# Patient Record
Sex: Female | Born: 1995 | Race: Black or African American | Hispanic: No | Marital: Single | Smoking: Former smoker
Health system: Southern US, Community
[De-identification: ages and names within clinical notes are randomized; demographics above are authoritative.]

## PROBLEM LIST (undated history)

## (undated) ENCOUNTER — Inpatient Hospital Stay (HOSPITAL_COMMUNITY): Payer: Self-pay

## (undated) DIAGNOSIS — R509 Fever, unspecified: Secondary | ICD-10-CM

## (undated) DIAGNOSIS — N179 Acute kidney failure, unspecified: Secondary | ICD-10-CM

## (undated) DIAGNOSIS — O8612 Endometritis following delivery: Secondary | ICD-10-CM

## (undated) DIAGNOSIS — I7100 Dissection of unspecified site of aorta: Secondary | ICD-10-CM

## (undated) DIAGNOSIS — R000 Tachycardia, unspecified: Secondary | ICD-10-CM

## (undated) DIAGNOSIS — A419 Sepsis, unspecified organism: Secondary | ICD-10-CM

## (undated) DIAGNOSIS — Z3A19 19 weeks gestation of pregnancy: Secondary | ICD-10-CM

## (undated) DIAGNOSIS — E872 Acidosis, unspecified: Secondary | ICD-10-CM

## (undated) DIAGNOSIS — E876 Hypokalemia: Secondary | ICD-10-CM

## (undated) DIAGNOSIS — O365930 Maternal care for other known or suspected poor fetal growth, third trimester, not applicable or unspecified: Secondary | ICD-10-CM

## (undated) DIAGNOSIS — D649 Anemia, unspecified: Secondary | ICD-10-CM

## (undated) DIAGNOSIS — O26849 Uterine size-date discrepancy, unspecified trimester: Secondary | ICD-10-CM

## (undated) DIAGNOSIS — A749 Chlamydial infection, unspecified: Secondary | ICD-10-CM

## (undated) DIAGNOSIS — Z3689 Encounter for other specified antenatal screening: Secondary | ICD-10-CM

## (undated) DIAGNOSIS — J9601 Acute respiratory failure with hypoxia: Secondary | ICD-10-CM

## (undated) DIAGNOSIS — D72829 Elevated white blood cell count, unspecified: Secondary | ICD-10-CM

## (undated) DIAGNOSIS — R7881 Bacteremia: Secondary | ICD-10-CM

## (undated) DIAGNOSIS — R6521 Severe sepsis with septic shock: Secondary | ICD-10-CM

## (undated) DIAGNOSIS — O903 Peripartum cardiomyopathy: Secondary | ICD-10-CM

## (undated) DIAGNOSIS — F32A Depression, unspecified: Secondary | ICD-10-CM

## (undated) DIAGNOSIS — N12 Tubulo-interstitial nephritis, not specified as acute or chronic: Secondary | ICD-10-CM

## (undated) DIAGNOSIS — O365990 Maternal care for other known or suspected poor fetal growth, unspecified trimester, not applicable or unspecified: Secondary | ICD-10-CM

## (undated) DIAGNOSIS — M549 Dorsalgia, unspecified: Secondary | ICD-10-CM

## (undated) DIAGNOSIS — B9620 Unspecified Escherichia coli [E. coli] as the cause of diseases classified elsewhere: Secondary | ICD-10-CM

## (undated) DIAGNOSIS — N10 Acute tubulo-interstitial nephritis: Secondary | ICD-10-CM

## (undated) DIAGNOSIS — O09299 Supervision of pregnancy with other poor reproductive or obstetric history, unspecified trimester: Secondary | ICD-10-CM

## (undated) HISTORY — DX: Uterine size-date discrepancy, unspecified trimester: O26.849

## (undated) HISTORY — DX: Peripartum cardiomyopathy: O90.3

## (undated) HISTORY — DX: Tubulo-interstitial nephritis, not specified as acute or chronic: N12

## (undated) HISTORY — DX: Acute kidney failure, unspecified: N17.9

## (undated) HISTORY — DX: Anemia, unspecified: D64.9

## (undated) HISTORY — DX: Acidosis: E87.2

## (undated) HISTORY — DX: Dissection of unspecified site of aorta: I71.00

## (undated) HISTORY — DX: Unspecified Escherichia coli (E. coli) as the cause of diseases classified elsewhere: B96.20

## (undated) HISTORY — DX: Hypomagnesemia: E83.42

## (undated) HISTORY — DX: 19 weeks gestation of pregnancy: Z3A.19

## (undated) HISTORY — DX: Sepsis, unspecified organism: R65.21

## (undated) HISTORY — DX: Acute pyelonephritis: N10

## (undated) HISTORY — DX: Encounter for other specified antenatal screening: Z36.89

## (undated) HISTORY — DX: Tachycardia, unspecified: R00.0

## (undated) HISTORY — DX: Acidosis, unspecified: E87.20

## (undated) HISTORY — DX: Chlamydial infection, unspecified: A74.9

## (undated) HISTORY — DX: Fever, unspecified: R50.9

## (undated) HISTORY — PX: DILATION AND CURETTAGE OF UTERUS: SHX78

## (undated) HISTORY — DX: Sepsis, unspecified organism: A41.9

## (undated) HISTORY — DX: Hypokalemia: E87.6

## (undated) HISTORY — DX: Bacteremia: R78.81

## (undated) HISTORY — DX: Maternal care for other known or suspected poor fetal growth, third trimester, not applicable or unspecified: O36.5930

## (undated) HISTORY — DX: Acute respiratory failure with hypoxia: J96.01

## (undated) HISTORY — DX: Endometritis following delivery: O86.12

## (undated) HISTORY — DX: Elevated white blood cell count, unspecified: D72.829

## (undated) HISTORY — DX: Maternal care for other known or suspected poor fetal growth, unspecified trimester, not applicable or unspecified: O36.5990

## (undated) HISTORY — DX: Dorsalgia, unspecified: M54.9

## (undated) HISTORY — DX: Supervision of pregnancy with other poor reproductive or obstetric history, unspecified trimester: O09.299

---
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
## 2015-01-01 ENCOUNTER — Encounter: Payer: Medicaid Other | Admitting: Physician Assistant

## 2015-01-01 ENCOUNTER — Encounter: Payer: Self-pay | Admitting: Physician Assistant

## 2015-01-08 ENCOUNTER — Ambulatory Visit (INDEPENDENT_AMBULATORY_CARE_PROVIDER_SITE_OTHER): Payer: Medicaid Other | Admitting: Advanced Practice Midwife

## 2015-01-08 VITALS — BP 119/68 | HR 86 | Temp 98.2°F | Wt 127.6 lb

## 2015-01-08 DIAGNOSIS — O219 Vomiting of pregnancy, unspecified: Secondary | ICD-10-CM | POA: Diagnosis not present

## 2015-01-08 DIAGNOSIS — Z3402 Encounter for supervision of normal first pregnancy, second trimester: Secondary | ICD-10-CM

## 2015-01-08 DIAGNOSIS — Z23 Encounter for immunization: Secondary | ICD-10-CM

## 2015-01-08 MED ORDER — ONDANSETRON 4 MG PO TBDP: 4.0000 mg | ORAL_TABLET | Freq: Three times a day (TID) | ORAL | Status: DC | PRN

## 2015-01-08 MED ORDER — TETANUS-DIPHTH-ACELL PERTUSSIS 5-2.5-18.5 LF-MCG/0.5 IM SUSP
0.5000 mL | Freq: Once | INTRAMUSCULAR | Status: AC
Start: 2015-01-08 — End: 2015-01-08
  Administered 2015-01-08: 0.5 mL via INTRAMUSCULAR

## 2015-01-08 NOTE — Progress Notes (Signed)
Vomited Glucola. Will reschedule and Rx antiemetic.

## 2015-01-08 NOTE — Progress Notes (Signed)
Attempted 1hr-- patient vomited after drinking. To come back for one hour lab appointment. Needs refill of zofran/phenergan beforehand.  Tdap today.  Has not yet left urine.

## 2015-01-08 NOTE — Addendum Note (Signed)
Addended by: Kathee Delton on: 01/08/2015 12:52 PM   Modules accepted: Orders

## 2015-01-08 NOTE — Patient Instructions (Signed)
Preterm Labor Information Preterm labor is when labor starts at less than 37 weeks of pregnancy. The normal length of a pregnancy is 39 to 41 weeks. CAUSES Often, there is no identifiable underlying cause as to why a woman goes into preterm labor. One of the most common known causes of preterm labor is infection. Infections of the uterus, cervix, vagina, amniotic sac, bladder, kidney, or even the lungs (pneumonia) can cause labor to start. Other suspected causes of preterm labor include:   Urogenital infections, such as yeast infections and bacterial vaginosis.   Uterine abnormalities (uterine shape, uterine septum, fibroids, or bleeding from the placenta).   A cervix that has been operated on (it may fail to stay closed).   Malformations in the fetus.   Multiple gestations (twins, triplets, and so on).   Breakage of the amniotic sac.  RISK FACTORS  Having a previous history of preterm labor.   Having premature rupture of membranes (PROM).   Having a placenta that covers the opening of the cervix (placenta previa).   Having a placenta that separates from the uterus (placental abruption).   Having a cervix that is too weak to hold the fetus in the uterus (incompetent cervix).   Having too much fluid in the amniotic sac (polyhydramnios).   Taking illegal drugs or smoking while pregnant.   Not gaining enough weight while pregnant.   Being younger than 62 and older than 19 years old.   Having a low socioeconomic status.   Being African American. SYMPTOMS Signs and symptoms of preterm labor include:   Menstrual-like cramps, abdominal pain, or back pain.  Uterine contractions that are regular, as frequent as six in an hour, regardless of their intensity (may be mild or painful).  Contractions that start on the top of the uterus and spread down to the lower abdomen and back.   A sense of increased pelvic pressure.   A watery or bloody mucus discharge that  comes from the vagina.  TREATMENT Depending on the length of the pregnancy and other circumstances, your health care provider may suggest bed rest. If necessary, there are medicines that can be given to stop contractions and to mature the fetal lungs. If labor happens before 34 weeks of pregnancy, a prolonged hospital stay may be recommended. Treatment depends on the condition of both you and the fetus.  WHAT SHOULD YOU DO IF YOU THINK YOU ARE IN PRETERM LABOR? Call your health care provider right away. You will need to go to the hospital to get checked immediately. HOW CAN YOU PREVENT PRETERM LABOR IN FUTURE PREGNANCIES? You should:   Stop smoking if you smoke.  Maintain healthy weight gain and avoid chemicals and drugs that are not necessary.  Be watchful for any type of infection.  Inform your health care provider if you have a known history of preterm labor. Document Released: 10/23/2003 Document Revised: 04/04/2013 Document Reviewed: 09/04/2012 [facility] Patient Information 2015 [place], [place]. This information is not intended to replace advice given to you by your health care provider. Make sure you discuss any questions you have with your health care provider.  Tdap Vaccine (Tetanus, Diphtheria, Pertussis): What You Need to Know 1. Why get vaccinated? Tetanus, diphtheria and pertussis can be very serious diseases, even for adolescents and adults. Tdap vaccine can protect [place] from these diseases. TETANUS (Lockjaw) causes painful muscle tightening and stiffness, usually all over the body.  It can lead to tightening of muscles in the head and neck so you  can't open your mouth, swallow, or sometimes even breathe. Tetanus kills about 1 out of 5 people who are infected. DIPHTHERIA can cause a thick coating to form in the back of the throat.  It can lead to breathing problems, paralysis, heart failure, and death. PERTUSSIS (Whooping Cough) causes severe coughing spells, which can cause  difficulty breathing, vomiting and disturbed sleep.  It can also lead to weight loss, incontinence, and rib fractures. Up to 2 in 100 adolescents and 5 in 100 adults with pertussis are hospitalized or have complications, which could include pneumonia or death. These diseases are caused by bacteria. Diphtheria and pertussis are spread from person to person through coughing or sneezing. Tetanus enters the body through cuts, scratches, or wounds. Before vaccines, the [place] saw as many as 200,000 cases a year of diphtheria and pertussis, and hundreds of cases of tetanus. Since vaccination began, tetanus and diphtheria have dropped by about 99% and pertussis by about 80%. 2. Tdap vaccine Tdap vaccine can protect adolescents and adults from tetanus, diphtheria, and pertussis. One dose of Tdap is routinely given at age 19 or 7112. People who did not get Tdap at that age should get it as soon as possible. Tdap is especially important for health care professionals and anyone having close contact with a baby younger than 12 months. Pregnant women should get a dose of Tdap during every pregnancy, to protect the newborn from pertussis. Infants are most at risk for severe, life-threatening complications from pertussis. A similar vaccine, called Td, protects from tetanus and diphtheria, but not pertussis. A Td booster should be given every 10 years. Tdap may be given as one of these boosters if you have not already gotten a dose. Tdap may also be given after a severe cut or burn to prevent tetanus infection. Your doctor can give you more information. Tdap may safely be given at the same time as other vaccines. 3. Some people should not get this vaccine  If you ever had a life-threatening allergic reaction after a dose of any tetanus, diphtheria, or pertussis containing vaccine, OR if you have a severe allergy to any part of this vaccine, you should not get Tdap. Tell your doctor if you have any severe  allergies.  If you had a coma, or long or multiple seizures within 7 days after a childhood dose of DTP or DTaP, you should not get Tdap, unless a cause other than the vaccine was found. You can still get Td.  Talk to your doctor if you:  have epilepsy or another nervous system problem,  had severe pain or swelling after any vaccine containing diphtheria, tetanus or pertussis,  ever had Guillain-Barr Syndrome (GBS),  aren't feeling well on the day the shot is scheduled. 4. Risks of a vaccine reaction With any medicine, including vaccines, there is a chance of side effects. These are usually mild and go away on their own, but serious reactions are also possible. Brief fainting spells can follow a vaccination, leading to injuries from falling. Sitting or lying down for about 15 minutes can help prevent these. Tell your doctor if you feel dizzy or light-headed, or have vision changes or ringing in the ears. Mild problems following Tdap (Did not interfere with activities)  Pain where the shot was given (about 3 in 4 adolescents or 2 in 3 adults)  Redness or swelling where the shot was given (about 1 person in 5)  Mild fever of at least 100.110F (up to about 1  in 25 adolescents or 1 in 100 adults)  Headache (about 3 or 4 people in 10)  Tiredness (about 1 person in 3 or 4)  Nausea, vomiting, diarrhea, stomach ache (up to 1 in 4 adolescents or 1 in 10 adults)  Chills, body aches, sore joints, rash, swollen glands (uncommon) Moderate problems following Tdap (Interfered with activities, but did not require medical attention)  Pain where the shot was given (about 1 in 5 adolescents or 1 in 100 adults)  Redness or swelling where the shot was given (up to about 1 in 16 adolescents or 1 in 25 adults)  Fever over 102F (about 1 in 100 adolescents or 1 in 250 adults)  Headache (about 3 in 20 adolescents or 1 in 10 adults)  Nausea, vomiting, diarrhea, stomach ache (up to 1 or 3 people in  100)  Swelling of the entire arm where the shot was given (up to about 3 in 100). Severe problems following Tdap (Unable to perform usual activities; required medical attention)  Swelling, severe pain, bleeding and redness in the arm where the shot was given (rare). A severe allergic reaction could occur after any vaccine (estimated less than 1 in a million doses). 5. What if there is a serious reaction? What should I look for?  Look for anything that concerns you, such as signs of a severe allergic reaction, very high fever, or behavior changes. Signs of a severe allergic reaction can include hives, swelling of the face and throat, difficulty breathing, a fast heartbeat, dizziness, and weakness. These would start a few minutes to a few hours after the vaccination. What should I do?  If you think it is a severe allergic reaction or other emergency that can't wait, call 9-1-1 or get the person to the nearest hospital. Otherwise, call your doctor.  Afterward, the reaction should be reported to the "Vaccine Adverse Event Reporting System" (VAERS). Your doctor might file this report, or you can do it yourself through the VAERS web site at www.vaers.hhs.gov, or by calling 1-800-822-7967. VAERS is only for reporting reactions. They do not give medical advice.  6. The National Vaccine Injury Compensation Program The National Vaccine Injury Compensation Program (VICP) is a federal program that was created to compensate people who may have been injured by certain vaccines. Persons who believe they may have been injured by a vaccine can learn about the program and about filing a claim by calling 1-800-338-2382 or visiting the VICP website at www.hrsa.gov/vaccinecompensation. 7. How can I learn more?  Ask your doctor.  Call your local or state health department.  Contact the Centers for Disease Control and Prevention (CDC):  Call 1-800-232-4636 or visit CDC's website at www.cdc.gov/vaccines. CDC  Tdap Vaccine VIS (12/23/11) Document Released: 02/01/2012 Document Revised: 12/17/2013 Document Reviewed: 11/14/2013 [employer] Patient Information 2015 [employer]. This information is not intended to replace advice given to you by your health care provider. Make sure you discuss any questions you have with your health care provider.  

## 2015-01-22 ENCOUNTER — Encounter: Payer: Medicaid Other | Admitting: Family

## 2015-01-24 ENCOUNTER — Encounter (HOSPITAL_COMMUNITY): Payer: Self-pay | Admitting: Emergency Medicine

## 2015-01-24 ENCOUNTER — Emergency Department (HOSPITAL_COMMUNITY)
Admission: EM | Admit: 2015-01-24 | Discharge: 2015-01-25 | Disposition: A | Discharge status: Home / Self Care | Payer: Medicaid Other | Attending: Emergency Medicine | Admitting: Emergency Medicine

## 2015-01-24 DIAGNOSIS — O9989 Other specified diseases and conditions complicating pregnancy, childbirth and the puerperium: Secondary | ICD-10-CM | POA: Insufficient documentation

## 2015-01-24 DIAGNOSIS — O26899 Other specified pregnancy related conditions, unspecified trimester: Secondary | ICD-10-CM

## 2015-01-24 DIAGNOSIS — R109 Unspecified abdominal pain: Secondary | ICD-10-CM | POA: Insufficient documentation

## 2015-01-24 DIAGNOSIS — Z87891 Personal history of nicotine dependence: Secondary | ICD-10-CM | POA: Insufficient documentation

## 2015-01-24 DIAGNOSIS — Z3A28 28 weeks gestation of pregnancy: Secondary | ICD-10-CM | POA: Insufficient documentation

## 2015-01-24 LAB — COMPREHENSIVE METABOLIC PANEL
ALT: 6 U/L — AB (ref 14–54)
ANION GAP: 8 (ref 5–15)
AST: 12 U/L — ABNORMAL LOW (ref 15–41)
Albumin: 2.7 g/dL — ABNORMAL LOW (ref 3.5–5.0)
Alkaline Phosphatase: 80 U/L (ref 38–126)
BUN: 8 mg/dL (ref 6–20)
CALCIUM: 8.5 mg/dL — AB (ref 8.9–10.3)
CHLORIDE: 104 mmol/L (ref 101–111)
CO2: 24 mmol/L (ref 22–32)
Creatinine, Ser: 0.51 mg/dL (ref 0.44–1.00)
GFR calc non Af Amer: >60 mL/min (ref >60)
GLUCOSE: 91 mg/dL (ref 65–99)
POTASSIUM: 3.1 mmol/L — AB (ref 3.5–5.1)
SODIUM: 136 mmol/L (ref 135–145)
TOTAL PROTEIN: 6.2 g/dL — AB (ref 6.5–8.1)
Total Bilirubin: 0.2 mg/dL — ABNORMAL LOW (ref 0.3–1.2)

## 2015-01-24 LAB — URINALYSIS, ROUTINE W REFLEX MICROSCOPIC
Bilirubin Urine: NEGATIVE
GLUCOSE, UA: NEGATIVE mg/dL
HGB URINE DIPSTICK: NEGATIVE
Ketones, ur: NEGATIVE mg/dL
Nitrite: NEGATIVE
Protein, ur: NEGATIVE mg/dL
SPECIFIC GRAVITY, URINE: 1.022 (ref 1.005–1.030)
Urobilinogen, UA: 1 mg/dL (ref 0.0–1.0)
pH: 6 (ref 5.0–8.0)

## 2015-01-24 LAB — CBC WITH DIFFERENTIAL/PLATELET
Basophils Absolute: 0 10*3/uL (ref 0.0–0.1)
Basophils Relative: 0 % (ref 0–1)
EOS ABS: 0.4 10*3/uL (ref 0.0–0.7)
EOS PCT: 6 % — AB (ref 0–5)
HEMATOCRIT: 25.2 % — AB (ref 36.0–46.0)
Hemoglobin: 8.3 g/dL — ABNORMAL LOW (ref 12.0–15.0)
Lymphocytes Relative: 26 % (ref 12–46)
Lymphs Abs: 2 10*3/uL (ref 0.7–4.0)
MCH: 28.2 pg (ref 26.0–34.0)
MCHC: 32.9 g/dL (ref 30.0–36.0)
MCV: 85.7 fL (ref 78.0–100.0)
MONOS PCT: 8 % (ref 3–12)
Monocytes Absolute: 0.6 10*3/uL (ref 0.1–1.0)
NEUTROS ABS: 4.6 10*3/uL (ref 1.7–7.7)
Neutrophils Relative %: 60 % (ref 43–77)
PLATELETS: 272 10*3/uL (ref 150–400)
RBC: 2.94 MIL/uL — ABNORMAL LOW (ref 3.87–5.11)
RDW: 12.9 % (ref 11.5–15.5)
WBC: 7.6 10*3/uL (ref 4.0–10.5)

## 2015-01-24 LAB — URINE MICROSCOPIC-ADD ON

## 2015-01-24 LAB — HCG, QUANTITATIVE, PREGNANCY: hCG, Beta Chain, Quant, S: 20093 m[IU]/mL — ABNORMAL HIGH (ref <5)

## 2015-01-24 MED ORDER — CEPHALEXIN 500 MG PO CAPS: 500.0000 mg | ORAL_CAPSULE | Freq: Three times a day (TID) | ORAL | Status: DC

## 2015-01-24 NOTE — ED Provider Notes (Signed)
CSN: [id number]     Arrival date & time 01/24/15  2122 History   First MD Initiated Contact with Patient 01/24/15 2155     Chief Complaint  Patient presents with  . Abdominal Pain     (Consider location/radiation/quality/duration/timing/severity/associated sxs/prior Treatment) The history is provided by the patient and medical records.   This is an 19 year old G1 P0 approximately 7 months gestation, presenting to the ED for right-sided abdominal pain. She states this began yesterday and has been occurring intermittently since this time. She states pain is described as a "pulling and stabbing" sensation along the right side of her abdomen. States pain better when lying on her side. She denies any nausea, vomiting, or diarrhea. She denies any urinary symptoms. No vaginal bleeding or loss of fluid.  Patient is seen at [facility] hospital.  States no complications thus far during pregnancy.  She is still taking her pre-natal vitamins.  Patient states baby has continued moving regularly throughout the day.  History reviewed. No pertinent past medical history. History reviewed. No pertinent past surgical history. Family History  Problem Relation Age of Onset  . Cancer Mother    History  Substance Use Topics  . Smoking status: Former Smoker -- 0.00 packs/day    Types: Cigars  . Smokeless tobacco: Not on file  . Alcohol Use: No     Comment: occassionally   OB History    Gravida Para Term Preterm AB TAB SAB Ectopic Multiple Living   1              Review of Systems  Gastrointestinal: Positive for abdominal pain.  All other systems reviewed and are negative.     Allergies  Review of patient's allergies indicates no known allergies.  Home Medications   Prior to Admission medications   Medication Sig Start Date End Date Taking? Authorizing Provider  ondansetron (ZOFRAN ODT) 4 MG disintegrating tablet Take 1 tablet (4 mg total) by mouth every 8 (eight) hours as needed for nausea or  vomiting. 01/08/15  Yes Dorathy Kinsman, CNM  Prenatal Vit-Fe Fumarate-FA (PRENATAL COMPLETE) 14-0.4 MG TABS Take as directed Patient taking differently: Take 1 tablet by mouth daily.  09/02/14  Yes Gerhard Munch, MD  promethazine (PHENERGAN) 25 MG tablet Take 0.5-1 tablets (12.5-25 mg total) by mouth every 6 (six) hours as needed. Patient not taking: Reported on 12/04/2014 10/10/14   Armando Reichert, CNM   BP 115/59 mmHg  Pulse 80  Temp(Src) 98.3 F (36.8 C) (Oral)  Resp 20  Ht  (1.6 m)  Wt 132 lb (59.875 kg)  BMI 23.39 kg/m2  SpO2 100%  LMP 07/05/2014   Physical Exam  Constitutional: She is oriented to person, place, and time. She appears well-developed and well-nourished.  HENT:  Head: Normocephalic and atraumatic.  Mouth/Throat: Oropharynx is clear and moist.  Eyes: Conjunctivae and EOM are normal. Pupils are equal, round, and reactive to light.  Neck: Normal range of motion.  Cardiovascular: Normal rate, regular rhythm and normal heart sounds.   Pulmonary/Chest: Effort normal. No respiratory distress. She has no wheezes.  Abdominal: Soft. Bowel sounds are normal. There is tenderness. There is no CVA tenderness and no tenderness at McBurney's point.    Gravid abdomen; tenderness along right side of abdomen  Musculoskeletal: Normal range of motion.  Neurological: She is alert and oriented to person, place, and time.  Skin: Skin is warm and dry.  Psychiatric: She has a normal mood and affect.  Nursing note and vitals  reviewed.   ED Course  Procedures (including critical care time) Labs Review Labs Reviewed  CBC WITH DIFFERENTIAL/PLATELET - Abnormal; Notable for the following:    RBC 2.94 (*)    Hemoglobin 8.3 (*)    HCT 25.2 (*)    Eosinophils Relative 6 (*)    All other components within normal limits  COMPREHENSIVE METABOLIC PANEL - Abnormal; Notable for the following:    Potassium 3.1 (*)    Calcium 8.5 (*)    Total Protein 6.2 (*)    Albumin 2.7 (*)    AST  12 (*)    ALT 6 (*)    Total Bilirubin 0.2 (*)    All other components within normal limits  HCG, QUANTITATIVE, PREGNANCY - Abnormal; Notable for the following:    hCG, Beta Chain, Sharene Butters, S 20093 (*)    All other components within normal limits  URINALYSIS, ROUTINE W REFLEX MICROSCOPIC (NOT AT [facility]) - Abnormal; Notable for the following:    APPearance CLOUDY (*)    Leukocytes, UA MODERATE (*)    All other components within normal limits  URINE MICROSCOPIC-ADD ON - Abnormal; Notable for the following:    Squamous Epithelial / LPF MANY (*)    Bacteria, UA FEW (*)    All other components within normal limits  URINE CULTURE    Imaging Review No results found.   EKG Interpretation None      MDM   Final diagnoses:  Abdominal pain in pregnancy   19 y.o. G1P0 approx 7 months gestation here with right sided abdominal pain which began yesterday.  States localized to right side of abdomen.  No associated symptoms. Pain improves with lying on her side. Patient has had an uncoiled K pregnancy thus far.  States baby has continued moving regularly today.  Rapid OB paged on arrival and patient was monitored for approx 1.5 hours-- FHR stable in 140's, no contraction activity noted.  Lab work here is overall reassuring.  U/a does appear mildly infected with moderate leuks, bacteria, and 11-20 WBC's.  No hematuria to suggest acute stone.  Will write for keflex pending urine culture.  Given that patient remains stable without vaginal bleeding, loss or fluid, N/V, or any signs of distress do not feel she emergently needs further work-up and is stable for discharge.  Patient has previously scheduled follow-up at [facility] on 6/20-- IVP patient to keep this appointment.  Will follow-up sooner for any new or worsening symptoms.  Discussed plan with patient, he/she acknowledged understanding and agreed with plan of care.  Return precautions given for new or worsening symptoms.  Case discussed with  attending physician, Dr. Juleen China, who evaluated patient and agrees with assessment and plan of care.  Garlon Hatchet, PA-C 01/25/15 9842  Raeford Razor, MD 01/29/15 [phone]

## 2015-01-24 NOTE — Discharge Instructions (Signed)
May take tylenol as needed for abdominal pain. Follow-up at [facility] hospital as scheduled or sooner for any new/worsening symptoms-- recurrent pain, vaginal bleeding, loss of fluid, etc.

## 2015-01-24 NOTE — Progress Notes (Signed)
Per chart review, patient has Medicaid [place] Access insurance with [facility] department listed as pcp.  Patient also has an upcoming appointment at [facility] out patient clinic on 06/20 at 1030am.

## 2015-01-24 NOTE — ED Notes (Signed)
Pt c/o R sided abd pain, describes as pulling and stabbing, pt is 7 mo preg, G1P0. Denies vag bleeding

## 2015-01-24 NOTE — ED Notes (Signed)
Pt placed on external fetal monitors. FHR reassuring and reactive. No UC's. Pt c/o right sided pain. Call to Dr Emelda Fear. OB cleared. Pt has next OB appointment 6/20 at [facility]. EDP aware of OB treatment

## 2015-01-26 LAB — URINE CULTURE: Colony Count: 30000

## 2015-02-03 ENCOUNTER — Ambulatory Visit (INDEPENDENT_AMBULATORY_CARE_PROVIDER_SITE_OTHER): Payer: Medicaid Other | Admitting: Obstetrics and Gynecology

## 2015-02-03 VITALS — BP 111/61 | HR 83 | Temp 98.1°F | Wt 128.8 lb

## 2015-02-03 DIAGNOSIS — O26849 Uterine size-date discrepancy, unspecified trimester: Secondary | ICD-10-CM

## 2015-02-03 DIAGNOSIS — Z3493 Encounter for supervision of normal pregnancy, unspecified, third trimester: Secondary | ICD-10-CM

## 2015-02-03 DIAGNOSIS — O3660X0 Maternal care for excessive fetal growth, unspecified trimester, not applicable or unspecified: Secondary | ICD-10-CM

## 2015-02-03 DIAGNOSIS — Z3402 Encounter for supervision of normal first pregnancy, second trimester: Secondary | ICD-10-CM

## 2015-02-03 LAB — POCT URINALYSIS DIP (DEVICE)
Bilirubin Urine: NEGATIVE
Glucose, UA: NEGATIVE mg/dL
Hgb urine dipstick: NEGATIVE
Ketones, ur: NEGATIVE mg/dL
Nitrite: NEGATIVE
Protein, ur: NEGATIVE mg/dL
Specific Gravity, Urine: 1.02 (ref 1.005–1.030)
Urobilinogen, UA: 2 mg/dL — ABNORMAL HIGH (ref 0.0–1.0)
pH: 7 (ref 5.0–8.0)

## 2015-02-03 LAB — OB RESULTS CONSOLE HIV ANTIBODY (ROUTINE TESTING): HIV: NONREACTIVE

## 2015-02-03 NOTE — Progress Notes (Signed)
Breastfeeding tip of the week reviewed 1 hour gtt with 28 wk labs 28 wk educational material given Pt recently had visit to [facility]U of right lower abdominal pain Pt reports being dizzy

## 2015-02-03 NOTE — Progress Notes (Signed)
Subjective:  Toccarra Kestel is a 19 y.o. G1P0 at [redacted]w[redacted]d being seen today for ongoing prenatal care.  Patient reports Dizziness, RLQ pain -- worse with laying on side..  Contractions: Not present.  Vag. Bleeding: None. Movement: Present. Denies leaking of fluid.   Recently went to [facility]U for RLQ pain, dx'd with UTI,  But UCx normal. Took all of keflex and pain improved somewhat  The following portions of the patient's history were reviewed and updated as appropriate: allergies, current medications, past family history, past medical history, past social history, past surgical history and problem list.   Objective:   Filed Vitals:   02/03/15 1054  BP: 111/61  Pulse: 83  Temp: 98.1 F (36.7 C)  Weight: 128 lb 12.8 oz (58.423 kg)    Fetal Status: Fetal Heart Rate (bpm): 149 Fundal Height: 26 cm Movement: Present     General:  Alert, oriented and cooperative. Patient is in no acute distress.  Skin: Skin is warm and dry. No rash noted.   Cardiovascular: Normal heart rate noted  Respiratory: Effort and breath sounds normal, no problems with respiration noted  Abdomen: Soft, gravid, appropriate for gestational age. Pain/Pressure: Absent     Vaginal: Vag. Bleeding: None.       Cervix: Not evaluated       Extremities: Normal range of motion.  Edema: Trace  Mental Status: Normal mood and affect. Normal behavior. Normal judgment and thought content.   Urinalysis: Urine Protein: Negative Urine Glucose: Negative  Assessment and Plan:  Pregnancy: G1P0 at [redacted]w[redacted]d  1. Supervision of normal first pregnancy in second trimester No concerns. Discussed round ligament pain.  2. Prenatal care in third trimester Routine PNC - Glucose Tolerance, 1 HR (50g) w/o Fasting - CBC - RPR - HIV antibody (with reflex)  #) Size < dates - measuring 25cm at 30 w - growth [place] ordered  Preterm labor symptoms and general obstetric precautions including but not limited to vaginal bleeding, contractions, leaking of  fluid and fetal movement were reviewed in detail with the patient.  Please refer to After Visit Summary for other counseling recommendations.   No Follow-up on file.   Ethelda Chick, MD

## 2015-02-04 LAB — CBC
HEMATOCRIT: 28.5 % — AB (ref 36.0–46.0)
HEMOGLOBIN: 9.4 g/dL — AB (ref 12.0–15.0)
MCH: 28.3 pg (ref 26.0–34.0)
MCHC: 33 g/dL (ref 30.0–36.0)
MCV: 85.8 fL (ref 78.0–100.0)
MPV: 10 fL (ref 8.6–12.4)
Platelets: 282 10*3/uL (ref 150–400)
RBC: 3.32 MIL/uL — ABNORMAL LOW (ref 3.87–5.11)
RDW: 13.2 % (ref 11.5–15.5)
WBC: 6.2 10*3/uL (ref 4.0–10.5)

## 2015-02-04 LAB — RPR

## 2015-02-04 LAB — GLUCOSE TOLERANCE, 1 HOUR (50G) W/O FASTING: GLUCOSE 1 HOUR GTT: 105 mg/dL (ref 70–140)

## 2015-02-06 ENCOUNTER — Other Ambulatory Visit: Payer: Self-pay | Admitting: Obstetrics and Gynecology

## 2015-02-06 ENCOUNTER — Other Ambulatory Visit (HOSPITAL_COMMUNITY): Payer: Self-pay | Admitting: Maternal and Fetal Medicine

## 2015-02-06 ENCOUNTER — Ambulatory Visit (HOSPITAL_COMMUNITY)
Admission: RE | Admit: 2015-02-06 | Discharge: 2015-02-06 | Disposition: A | Discharge status: Home / Self Care | Payer: Medicaid Other | Source: Ambulatory Visit | Attending: Obstetrics and Gynecology | Admitting: Obstetrics and Gynecology

## 2015-02-06 DIAGNOSIS — Z3A30 30 weeks gestation of pregnancy: Secondary | ICD-10-CM | POA: Diagnosis not present

## 2015-02-06 DIAGNOSIS — O26849 Uterine size-date discrepancy, unspecified trimester: Secondary | ICD-10-CM

## 2015-02-06 DIAGNOSIS — O365930 Maternal care for other known or suspected poor fetal growth, third trimester, not applicable or unspecified: Secondary | ICD-10-CM | POA: Insufficient documentation

## 2015-02-06 LAB — HIV 1/2 CONFIRMATION
HIV 1 ANTIBODY: NEGATIVE
HIV 2 AB: NEGATIVE

## 2015-02-06 LAB — HIV ANTIBODY (ROUTINE TESTING W REFLEX): HIV: REACTIVE — AB

## 2015-02-09 LAB — HIV-1 RNA, QUALITATIVE, TMA

## 2015-02-11 ENCOUNTER — Encounter: Payer: Self-pay | Admitting: General Practice

## 2015-02-12 ENCOUNTER — Ambulatory Visit (HOSPITAL_COMMUNITY)
Admission: RE | Admit: 2015-02-12 | Discharge: 2015-02-12 | Disposition: A | Discharge status: Home / Self Care | Payer: Medicaid Other | Source: Ambulatory Visit | Attending: Obstetrics and Gynecology | Admitting: Obstetrics and Gynecology

## 2015-02-12 DIAGNOSIS — Z3A31 31 weeks gestation of pregnancy: Secondary | ICD-10-CM | POA: Insufficient documentation

## 2015-02-12 DIAGNOSIS — O3660X0 Maternal care for excessive fetal growth, unspecified trimester, not applicable or unspecified: Secondary | ICD-10-CM | POA: Diagnosis present

## 2015-02-12 DIAGNOSIS — O365930 Maternal care for other known or suspected poor fetal growth, third trimester, not applicable or unspecified: Secondary | ICD-10-CM

## 2015-02-12 DIAGNOSIS — O26849 Uterine size-date discrepancy, unspecified trimester: Secondary | ICD-10-CM

## 2015-02-12 DIAGNOSIS — O365990 Maternal care for other known or suspected poor fetal growth, unspecified trimester, not applicable or unspecified: Secondary | ICD-10-CM | POA: Insufficient documentation

## 2015-02-18 ENCOUNTER — Ambulatory Visit (HOSPITAL_COMMUNITY)
Admission: RE | Admit: 2015-02-18 | Discharge: 2015-02-18 | Disposition: A | Discharge status: Home / Self Care | Payer: Medicaid Other | Source: Ambulatory Visit | Attending: Obstetrics & Gynecology | Admitting: Obstetrics & Gynecology

## 2015-02-18 ENCOUNTER — Other Ambulatory Visit: Payer: Self-pay | Admitting: Maternal and Fetal Medicine

## 2015-02-18 ENCOUNTER — Ambulatory Visit (INDEPENDENT_AMBULATORY_CARE_PROVIDER_SITE_OTHER): Payer: Medicaid Other | Admitting: Obstetrics & Gynecology

## 2015-02-18 ENCOUNTER — Encounter (HOSPITAL_COMMUNITY): Payer: Self-pay

## 2015-02-18 ENCOUNTER — Ambulatory Visit (HOSPITAL_COMMUNITY)
Admission: RE | Admit: 2015-02-18 | Discharge: 2015-02-18 | Disposition: A | Discharge status: Home / Self Care | Payer: Medicaid Other | Source: Ambulatory Visit | Attending: Maternal and Fetal Medicine | Admitting: Maternal and Fetal Medicine

## 2015-02-18 VITALS — BP 112/64 | HR 85 | Temp 98.7°F | Wt 129.0 lb

## 2015-02-18 DIAGNOSIS — Z3A32 32 weeks gestation of pregnancy: Secondary | ICD-10-CM | POA: Diagnosis not present

## 2015-02-18 DIAGNOSIS — O365930 Maternal care for other known or suspected poor fetal growth, third trimester, not applicable or unspecified: Secondary | ICD-10-CM | POA: Diagnosis not present

## 2015-02-18 DIAGNOSIS — IMO0002 Reserved for concepts with insufficient information to code with codable children: Secondary | ICD-10-CM

## 2015-02-18 DIAGNOSIS — Z3402 Encounter for supervision of normal first pregnancy, second trimester: Secondary | ICD-10-CM

## 2015-02-18 DIAGNOSIS — O288 Other abnormal findings on antenatal screening of mother: Secondary | ICD-10-CM

## 2015-02-18 DIAGNOSIS — O26843 Uterine size-date discrepancy, third trimester: Secondary | ICD-10-CM

## 2015-02-18 LAB — POCT URINALYSIS DIP (DEVICE)
Bilirubin Urine: NEGATIVE
Glucose, UA: NEGATIVE mg/dL
Ketones, ur: NEGATIVE mg/dL
Nitrite: NEGATIVE
PROTEIN: 30 mg/dL — AB
Specific Gravity, Urine: 1.02 (ref 1.005–1.030)
UROBILINOGEN UA: 2 mg/dL — AB (ref 0.0–1.0)
pH: 6.5 (ref 5.0–8.0)

## 2015-02-18 MED ORDER — PRENATAL COMPLETE 14-0.4 MG PO TABS: ORAL_TABLET | ORAL | Status: DC

## 2015-02-18 NOTE — Progress Notes (Signed)
Subjective:  Deanna Sullivan is a 19 y.o. G1P0 at [redacted]w[redacted]d being seen today for ongoing prenatal care.  Patient reports no complaints.  Contractions: Not present.  Vag. Bleeding: None. Movement: Present. Denies leaking of fluid.   The following portions of the patient's history were reviewed and updated as appropriate: allergies, current medications, past family history, past medical history, past social history, past surgical history and problem list.   Objective:   Filed Vitals:   02/18/15 1535  BP: 112/64  Pulse: 85  Temp: 98.7 F (37.1 C)  Weight: 129 lb (58.514 kg)    Fetal Status: Fetal Heart Rate (bpm): 151 Fundal Height: 27 cm Movement: Present     General:  Alert, oriented and cooperative. Patient is in no acute distress.  Skin: Skin is warm and dry. No rash noted.   Cardiovascular: Normal heart rate noted  Respiratory: Normal respiratory effort, no problems with respiration noted  Abdomen: Soft, gravid, appropriate for gestational age. Pain/Pressure: Present     Vaginal: Vag. Bleeding: None.       Cervix: Not evaluated        Extremities: Normal range of motion.  Edema: None  Mental Status: Normal mood and affect. Normal behavior. Normal judgment and thought content.   Urinalysis: Urine Protein: 1+ Urine Glucose: Negative  Assessment and Plan:  Pregnancy: G1P0 at [redacted]w[redacted]d  1. Encounter for supervision of normal first pregnancy in second trimester  - Prenatal Vit-Fe Fumarate-FA (PRENATAL COMPLETE) 14-0.4 MG TABS; Take as directed  Dispense: 60 each; Refill: 0  2. Uterine size date discrepancy, antepartum condition, third trimester  Pt for sono today at 4pm.  Will check S<D   Preterm labor symptoms and general obstetric precautions including but not limited to vaginal bleeding, contractions, leaking of fluid and fetal movement were reviewed in detail with the patient.  Please refer to After Visit Summary for other counseling recommendations.   Return in about 2 weeks  (around 03/04/2015).   Willodean Rosenthal, MD

## 2015-02-18 NOTE — Patient Instructions (Signed)
Third Trimester of Pregnancy The third trimester is from week 29 through week 42, months 7 through 9. The third trimester is a time when the fetus is growing rapidly. At the end of the ninth month, the fetus is about 20 inches in length and weighs 6-10 pounds.  BODY CHANGES Your body goes through many changes during pregnancy. The changes vary from woman to woman.   Your weight will continue to increase. You can expect to gain 25-35 pounds (11-16 kg) by the end of the pregnancy.  You may begin to get stretch marks on your hips, abdomen, and breasts.  You may urinate more often because the fetus is moving lower into your pelvis and pressing on your bladder.  You may develop or continue to have heartburn as a result of your pregnancy.  You may develop constipation because certain hormones are causing the muscles that push waste through your intestines to slow down.  You may develop hemorrhoids or swollen, bulging veins (varicose veins).  You may have pelvic pain because of the weight gain and pregnancy hormones relaxing your joints between the bones in your pelvis. Backaches may result from overexertion of the muscles supporting your posture.  You may have changes in your hair. These can include thickening of your hair, rapid growth, and changes in texture. Some women also have hair loss during or after pregnancy, or hair that feels dry or thin. Your hair will most likely return to normal after your baby is born.  Your breasts will continue to grow and be tender. A yellow discharge may leak from your breasts called colostrum.  Your belly button may stick out.  You may feel short of breath because of your expanding uterus.  You may notice the fetus "dropping," or moving lower in your abdomen.  You may have a bloody mucus discharge. This usually occurs a few days to a week before labor begins.  Your cervix becomes thin and soft (effaced) near your due date. WHAT TO EXPECT AT YOUR PRENATAL  EXAMS  You will have prenatal exams every 2 weeks until week 36. Then, you will have weekly prenatal exams. During a routine prenatal visit:  You will be weighed to make sure you and the fetus are growing normally.  Your blood pressure is taken.  Your abdomen will be measured to track your baby's growth.  The fetal heartbeat will be listened to.  Any test results from the previous visit will be discussed.  You may have a cervical check near your due date to see if you have effaced. At around 36 weeks, your caregiver will check your cervix. At the same time, your caregiver will also perform a test on the secretions of the vaginal tissue. This test is to determine if a type of bacteria, Group B streptococcus, is present. Your caregiver will explain this further. Your caregiver may ask you:  What your birth plan is.  How you are feeling.  If you are feeling the baby move.  If you have had any abnormal symptoms, such as leaking fluid, bleeding, severe headaches, or abdominal cramping.  If you have any questions. Other tests or screenings that may be performed during your third trimester include:  Blood tests that check for low iron levels (anemia).  Fetal testing to check the health, activity level, and growth of the fetus. Testing is done if you have certain medical conditions or if there are problems during the pregnancy. FALSE LABOR You may feel small, irregular contractions that   eventually go away. These are called Braxton Hicks contractions, or false labor. Contractions may last for hours, days, or even weeks before true labor sets in. If contractions come at regular intervals, intensify, or become painful, it is best to be seen by your caregiver.  SIGNS OF LABOR   Menstrual-like cramps.  Contractions that are 5 minutes apart or less.  Contractions that start on the top of the uterus and spread down to the lower abdomen and back.  A sense of increased pelvic pressure or back  pain.  A watery or bloody mucus discharge that comes from the vagina. If you have any of these signs before the 37th week of pregnancy, call your caregiver right away. You need to go to the hospital to get checked immediately. HOME CARE INSTRUCTIONS   Avoid all smoking, herbs, alcohol, and unprescribed drugs. These chemicals affect the formation and growth of the baby.  Follow your caregiver's instructions regarding medicine use. There are medicines that are either safe or unsafe to take during pregnancy.  Exercise only as directed by your caregiver. Experiencing uterine cramps is a good sign to stop exercising.  Continue to eat regular, healthy meals.  Wear a good support bra for breast tenderness.  Do not use hot tubs, steam rooms, or saunas.  Wear your seat belt at all times when driving.  Avoid raw meat, uncooked cheese, cat litter boxes, and soil used by cats. These carry germs that can cause birth defects in the baby.  Take your prenatal vitamins.  Try taking a stool softener (if your caregiver approves) if you develop constipation. Eat more high-fiber foods, such as fresh vegetables or fruit and whole grains. Drink plenty of fluids to keep your urine clear or pale yellow.  Take warm sitz baths to soothe any pain or discomfort caused by hemorrhoids. Use hemorrhoid cream if your caregiver approves.  If you develop varicose veins, wear support hose. Elevate your feet for 15 minutes, 3-4 times a day. Limit salt in your diet.  Avoid heavy lifting, wear low heal shoes, and practice good posture.  Rest a lot with your legs elevated if you have leg cramps or low back pain.  Visit your dentist if you have not gone during your pregnancy. Use a soft toothbrush to brush your teeth and be gentle when you floss.  A sexual relationship may be continued unless your caregiver directs you otherwise.  Do not travel far distances unless it is absolutely necessary and only with the approval  of your caregiver.  Take prenatal classes to understand, practice, and ask questions about the labor and delivery.  Make a trial run to the hospital.  Pack your hospital bag.  Prepare the baby's nursery.  Continue to go to all your prenatal visits as directed by your caregiver. SEEK MEDICAL CARE IF:  You are unsure if you are in labor or if your water has broken.  You have dizziness.  You have mild pelvic cramps, pelvic pressure, or nagging pain in your abdominal area.  You have persistent nausea, vomiting, or diarrhea.  You have a bad smelling vaginal discharge.  You have pain with urination. SEEK IMMEDIATE MEDICAL CARE IF:   You have a fever.  You are leaking fluid from your vagina.  You have spotting or bleeding from your vagina.  You have severe abdominal cramping or pain.  You have rapid weight loss or gain.  You have shortness of breath with chest pain.  You notice sudden or extreme swelling   of your face, hands, ankles, feet, or legs.  You have not felt your baby move in over an hour.  You have severe headaches that do not go away with medicine.  You have vision changes. Document Released: 07/27/2001 Document Revised: 08/07/2013 Document Reviewed: 10/03/2012 Exit[employer] Patient Information 2015 [employer]. This information is not intended to replace advice given to you by your health care provider. Make sure you discuss any questions you have with your health care provider.  

## 2015-02-18 NOTE — Progress Notes (Signed)
Patient does not intend to breastfeed.

## 2015-02-19 ENCOUNTER — Other Ambulatory Visit (HOSPITAL_COMMUNITY): Payer: Self-pay | Admitting: Maternal and Fetal Medicine

## 2015-02-19 ENCOUNTER — Telehealth: Payer: Self-pay | Admitting: *Deleted

## 2015-02-19 DIAGNOSIS — O365930 Maternal care for other known or suspected poor fetal growth, third trimester, not applicable or unspecified: Secondary | ICD-10-CM

## 2015-02-19 NOTE — Telephone Encounter (Signed)
Prescription found to have printed after patient  left- apparently her pharmacy does not have eprescribe.  Called in prescription for prenatal vitamins.  to [facility].

## 2015-02-21 ENCOUNTER — Ambulatory Visit (HOSPITAL_COMMUNITY)
Admission: RE | Admit: 2015-02-21 | Discharge: 2015-02-21 | Disposition: A | Discharge status: Home / Self Care | Payer: Medicaid Other | Source: Ambulatory Visit | Attending: Obstetrics & Gynecology | Admitting: Obstetrics & Gynecology

## 2015-02-21 ENCOUNTER — Other Ambulatory Visit (HOSPITAL_COMMUNITY): Payer: Self-pay | Admitting: Maternal and Fetal Medicine

## 2015-02-21 DIAGNOSIS — O365930 Maternal care for other known or suspected poor fetal growth, third trimester, not applicable or unspecified: Secondary | ICD-10-CM | POA: Insufficient documentation

## 2015-02-21 DIAGNOSIS — O365990 Maternal care for other known or suspected poor fetal growth, unspecified trimester, not applicable or unspecified: Secondary | ICD-10-CM | POA: Insufficient documentation

## 2015-02-21 DIAGNOSIS — Z3A33 33 weeks gestation of pregnancy: Secondary | ICD-10-CM | POA: Insufficient documentation

## 2015-02-21 DIAGNOSIS — Z3A00 Weeks of gestation of pregnancy not specified: Secondary | ICD-10-CM | POA: Insufficient documentation

## 2015-02-25 ENCOUNTER — Other Ambulatory Visit (HOSPITAL_COMMUNITY): Payer: Medicaid Other

## 2015-02-28 ENCOUNTER — Encounter (HOSPITAL_COMMUNITY): Payer: Self-pay

## 2015-02-28 ENCOUNTER — Other Ambulatory Visit (HOSPITAL_COMMUNITY): Payer: Medicaid Other

## 2015-02-28 ENCOUNTER — Ambulatory Visit (HOSPITAL_COMMUNITY)
Admission: RE | Admit: 2015-02-28 | Discharge: 2015-02-28 | Disposition: A | Discharge status: Home / Self Care | Payer: Medicaid Other | Source: Ambulatory Visit | Attending: Obstetrics & Gynecology | Admitting: Obstetrics & Gynecology

## 2015-02-28 ENCOUNTER — Other Ambulatory Visit (HOSPITAL_COMMUNITY): Payer: Self-pay | Admitting: Maternal and Fetal Medicine

## 2015-02-28 DIAGNOSIS — Z3A34 34 weeks gestation of pregnancy: Secondary | ICD-10-CM | POA: Insufficient documentation

## 2015-02-28 DIAGNOSIS — O365930 Maternal care for other known or suspected poor fetal growth, third trimester, not applicable or unspecified: Secondary | ICD-10-CM | POA: Diagnosis present

## 2015-02-28 DIAGNOSIS — Z3A37 37 weeks gestation of pregnancy: Secondary | ICD-10-CM | POA: Insufficient documentation

## 2015-03-04 ENCOUNTER — Encounter: Payer: Medicaid Other | Admitting: Obstetrics & Gynecology

## 2015-03-07 ENCOUNTER — Ambulatory Visit (HOSPITAL_COMMUNITY)
Admission: RE | Admit: 2015-03-07 | Discharge: 2015-03-07 | Disposition: A | Discharge status: Home / Self Care | Payer: Medicaid Other | Source: Ambulatory Visit | Attending: Obstetrics & Gynecology | Admitting: Obstetrics & Gynecology

## 2015-03-07 DIAGNOSIS — O365930 Maternal care for other known or suspected poor fetal growth, third trimester, not applicable or unspecified: Secondary | ICD-10-CM | POA: Diagnosis present

## 2015-03-14 ENCOUNTER — Ambulatory Visit (HOSPITAL_COMMUNITY): Payer: Medicaid Other | Attending: Obstetrics & Gynecology

## 2015-03-21 ENCOUNTER — Ambulatory Visit (HOSPITAL_COMMUNITY): Payer: Medicaid Other | Attending: Obstetrics & Gynecology

## 2015-03-26 ENCOUNTER — Encounter: Payer: Medicaid Other | Admitting: Advanced Practice Midwife

## 2015-04-02 ENCOUNTER — Encounter: Payer: Medicaid Other | Admitting: Certified Nurse Midwife

## 2015-04-12 ENCOUNTER — Inpatient Hospital Stay (HOSPITAL_COMMUNITY)
Admission: AD | Admit: 2015-04-12 | Discharge: 2015-04-12 | Disposition: A | Discharge status: Home / Self Care | Payer: Medicaid Other | Source: Ambulatory Visit | Attending: Obstetrics and Gynecology | Admitting: Obstetrics and Gynecology

## 2015-04-12 ENCOUNTER — Encounter (HOSPITAL_COMMUNITY): Payer: Self-pay

## 2015-04-12 DIAGNOSIS — Z3493 Encounter for supervision of normal pregnancy, unspecified, third trimester: Secondary | ICD-10-CM | POA: Insufficient documentation

## 2015-04-12 NOTE — Progress Notes (Signed)
Dr Natale Milch notified of  Pt's VE and contraction pattern, orders received to discharge home

## 2015-04-12 NOTE — MAU Note (Signed)
1338/1342-  Pt sitting up in bed tracing maternal heart rate alternating with fetal heart rate at times

## 2015-04-12 NOTE — Discharge Instructions (Signed)

## 2015-04-12 NOTE — MAU Note (Signed)
Pt states here for contractions and leaking fluid that began this am. No bleeding. Missed last Dr's appt.

## 2015-04-14 ENCOUNTER — Inpatient Hospital Stay (HOSPITAL_COMMUNITY)
Admission: AD | Admit: 2015-04-14 | Discharge: 2015-04-17 | DRG: 774 | Disposition: A | Discharge status: Home / Self Care | Payer: Medicaid Other | Source: Ambulatory Visit | Attending: Family Medicine | Admitting: Family Medicine

## 2015-04-14 ENCOUNTER — Ambulatory Visit (INDEPENDENT_AMBULATORY_CARE_PROVIDER_SITE_OTHER): Payer: Medicaid Other | Admitting: Certified Nurse Midwife

## 2015-04-14 ENCOUNTER — Encounter: Payer: Medicaid Other | Admitting: Obstetrics & Gynecology

## 2015-04-14 ENCOUNTER — Encounter (HOSPITAL_COMMUNITY): Payer: Self-pay

## 2015-04-14 VITALS — BP 133/89 | HR 87 | Temp 98.4°F | Wt 140.7 lb

## 2015-04-14 DIAGNOSIS — Z3A40 40 weeks gestation of pregnancy: Secondary | ICD-10-CM | POA: Diagnosis present

## 2015-04-14 DIAGNOSIS — Z30018 Encounter for initial prescription of other contraceptives: Secondary | ICD-10-CM

## 2015-04-14 DIAGNOSIS — O365930 Maternal care for other known or suspected poor fetal growth, third trimester, not applicable or unspecified: Secondary | ICD-10-CM | POA: Diagnosis present

## 2015-04-14 DIAGNOSIS — D649 Anemia, unspecified: Secondary | ICD-10-CM | POA: Diagnosis not present

## 2015-04-14 DIAGNOSIS — O99824 Streptococcus B carrier state complicating childbirth: Secondary | ICD-10-CM | POA: Diagnosis present

## 2015-04-14 DIAGNOSIS — Z87891 Personal history of nicotine dependence: Secondary | ICD-10-CM | POA: Diagnosis not present

## 2015-04-14 DIAGNOSIS — O4593 Premature separation of placenta, unspecified, third trimester: Secondary | ICD-10-CM | POA: Diagnosis present

## 2015-04-14 DIAGNOSIS — Z975 Presence of (intrauterine) contraceptive device: Secondary | ICD-10-CM

## 2015-04-14 DIAGNOSIS — O9081 Anemia of the puerperium: Secondary | ICD-10-CM | POA: Diagnosis not present

## 2015-04-14 DIAGNOSIS — O0933 Supervision of pregnancy with insufficient antenatal care, third trimester: Secondary | ICD-10-CM

## 2015-04-14 DIAGNOSIS — O480 Post-term pregnancy: Secondary | ICD-10-CM | POA: Diagnosis present

## 2015-04-14 DIAGNOSIS — O0993 Supervision of high risk pregnancy, unspecified, third trimester: Secondary | ICD-10-CM

## 2015-04-14 DIAGNOSIS — IMO0002 Reserved for concepts with insufficient information to code with codable children: Secondary | ICD-10-CM | POA: Diagnosis present

## 2015-04-14 LAB — POCT URINALYSIS DIP (DEVICE)
Bilirubin Urine: NEGATIVE
Glucose, UA: NEGATIVE mg/dL
Hgb urine dipstick: NEGATIVE
KETONES UR: NEGATIVE mg/dL
LEUKOCYTES UA: NEGATIVE
NITRITE: NEGATIVE
PH: 5.5 (ref 5.0–8.0)
Protein, ur: NEGATIVE mg/dL
Urobilinogen, UA: 0.2 mg/dL (ref 0.0–1.0)

## 2015-04-14 LAB — CBC
HCT: 22.9 % — ABNORMAL LOW (ref 36.0–46.0)
HEMOGLOBIN: 7.2 g/dL — AB (ref 12.0–15.0)
MCH: 25.3 pg — AB (ref 26.0–34.0)
MCHC: 31.4 g/dL (ref 30.0–36.0)
MCV: 80.4 fL (ref 78.0–100.0)
Platelets: 227 10*3/uL (ref 150–400)
RBC: 2.85 MIL/uL — AB (ref 3.87–5.11)
RDW: 14.8 % (ref 11.5–15.5)
WBC: 6.4 10*3/uL (ref 4.0–10.5)

## 2015-04-14 LAB — GROUP B STREP BY PCR: GROUP B STREP BY PCR: POSITIVE — AB

## 2015-04-14 LAB — OB RESULTS CONSOLE GBS: GBS: POSITIVE

## 2015-04-14 MED ORDER — OXYTOCIN BOLUS FROM INFUSION: 500.0000 mL | INTRAVENOUS | Status: DC

## 2015-04-14 MED ORDER — TERBUTALINE SULFATE 1 MG/ML IJ SOLN
0.2500 mg | Freq: Once | INTRAMUSCULAR | Status: AC | PRN
  Administered 2015-04-15: 0.25 mg via SUBCUTANEOUS
  Filled 2015-04-14: qty 1

## 2015-04-14 MED ORDER — OXYCODONE-ACETAMINOPHEN 5-325 MG PO TABS
1.0000 | ORAL_TABLET | ORAL | Status: DC | PRN
  Administered 2015-04-15: 1 via ORAL
  Filled 2015-04-14: qty 1

## 2015-04-14 MED ORDER — LIDOCAINE HCL (PF) 1 % IJ SOLN
30.0000 mL | INTRAMUSCULAR | Status: AC | PRN
  Administered 2015-04-15: 30 mL via SUBCUTANEOUS
  Filled 2015-04-14: qty 30

## 2015-04-14 MED ORDER — ZOLPIDEM TARTRATE 5 MG PO TABS
5.0000 mg | ORAL_TABLET | Freq: Every evening | ORAL | Status: DC | PRN
  Administered 2015-04-14: 5 mg via ORAL
  Filled 2015-04-14: qty 1

## 2015-04-14 MED ORDER — ACETAMINOPHEN 325 MG PO TABS: 650.0000 mg | ORAL_TABLET | ORAL | Status: DC | PRN

## 2015-04-14 MED ORDER — OXYCODONE-ACETAMINOPHEN 5-325 MG PO TABS: 2.0000 | ORAL_TABLET | ORAL | Status: DC | PRN

## 2015-04-14 MED ORDER — LACTATED RINGERS IV SOLN
500.0000 mL | INTRAVENOUS | Status: DC | PRN
  Administered 2015-04-15: 500 mL via INTRAVENOUS

## 2015-04-14 MED ORDER — FENTANYL CITRATE (PF) 100 MCG/2ML IJ SOLN
100.0000 ug | INTRAMUSCULAR | Status: DC | PRN
  Administered 2015-04-15: 100 ug via INTRAVENOUS
  Filled 2015-04-14 (×2): qty 2

## 2015-04-14 MED ORDER — MISOPROSTOL 25 MCG QUARTER TABLET
25.0000 ug | ORAL_TABLET | ORAL | Status: DC | PRN
  Administered 2015-04-14 – 2015-04-15 (×2): 25 ug via VAGINAL
  Filled 2015-04-14 (×2): qty 0.25
  Filled 2015-04-14: qty 1

## 2015-04-14 MED ORDER — ONDANSETRON HCL 4 MG/2ML IJ SOLN: 4.0000 mg | Freq: Four times a day (QID) | INTRAMUSCULAR | Status: DC | PRN

## 2015-04-14 MED ORDER — OXYTOCIN 40 UNITS IN LACTATED RINGERS INFUSION - SIMPLE MED
62.5000 mL/h | INTRAVENOUS | Status: DC
  Administered 2015-04-15 (×2): 62.5 mL/h via INTRAVENOUS
  Filled 2015-04-14: qty 1000

## 2015-04-14 MED ORDER — CITRIC ACID-SODIUM CITRATE 334-500 MG/5ML PO SOLN: 30.0000 mL | ORAL | Status: DC | PRN

## 2015-04-14 MED ORDER — LACTATED RINGERS IV SOLN
INTRAVENOUS | Status: DC
  Administered 2015-04-14 – 2015-04-15 (×2): via INTRAVENOUS

## 2015-04-14 NOTE — H&P (Signed)
LABOR ADMISSION HISTORY AND PHYSICAL  Deanna Sullivan is a 19 y.o. female G1P0 with IUP at [redacted]w[redacted]d by LMP presenting for IOL 2/2 IUGR and poor maternal follow-up with prenatal care. She reports +FM, no contractions, no LOF, no VB.  She plans on breast and bottle feeding. She is undecided on birth control.  Dating: By LMP --->  Estimated Date of Delivery: 04/11/15  Sono:   @[redacted]w[redacted]d , CWD, normal anatomy, cephalic presentation, 1727g, 16% EFW  Clinic [facility] Prenatal Labs  Dating LMP Blood type: O/POS/-- (03/23 1513) O+  Genetic Screen 1 Screen: AFP: Quad: normal  Antibody:NEG (03/23 1513)negative  Anatomic US Wnl; boy Rubella: 4.23 (03/23 1513)immune  GTT Early: Third trimester:  RPR: NON REAC (03/23 1513) NR  Flu vaccine 11/06/14 HBsAg: NEGATIVE (03/23 1513) negative  TDaP vaccine  01/08/15 Rhogam: NA HIV: REACTIVE (03/23 1513) NR  GBS  (For PCN allergy, check sensitivities) GBS:   Contraception Breast Pap: n/a  Baby Food breast   Circumcision desires   Pediatrician    Support Person        Prenatal History/Complications: -poor fetal growth; size inconsistent with growth  Past Medical History: History reviewed. No pertinent past medical history.  Past Surgical History: History reviewed. No pertinent past surgical history.  Obstetrical History: OB History    Gravida Para Term Preterm AB TAB SAB Ectopic Multiple Living   1               Social History: Social History   Social History  . Marital Status: Single    Spouse Name: N/A  . Number of Children: N/A  . Years of Education: N/A   Social History Main Topics  . Smoking status: Former Smoker -- 0.00 packs/day    Types: Cigars  . Smokeless tobacco: Never Used  . Alcohol Use: No     Comment: occassionally  . Drug Use: No  . Sexual Activity: Yes    Birth Control/ Protection: None   Other Topics Concern  .  None   Social History Narrative    Family History: Family History  Problem Relation Age of Onset  . Cancer Mother     Allergies: No Known Allergies  Prescriptions prior to admission  Medication Sig Dispense Refill Last Dose  . acetaminophen (TYLENOL) 500 MG tablet Take 500 mg by mouth every 6 (six) hours as needed for moderate pain or headache.   Past Month at Unknown time  . calcium carbonate (TUMS - DOSED IN MG ELEMENTAL CALCIUM) 500 MG chewable tablet Chew 1 tablet by mouth daily as needed for indigestion or heartburn.   Past Week at Unknown time  . Prenatal Vit-Fe Fumarate-FA (PRENATAL COMPLETE) 14-0.4 MG TABS Take as directed 60 each 0 04/14/2015 at Unknown time  . cephALEXin (KEFLEX) 500 MG capsule Take 1 capsule (500 mg total) by mouth 3 (three) times daily. (Patient not taking: Reported on 02/18/2015) 21 capsule 0 Completed Course at Unknown time  . ondansetron (ZOFRAN ODT) 4 MG disintegrating tablet Take 1 tablet (4 mg total) by mouth every 8 (eight) hours as needed for nausea or vomiting. (Patient not taking: Reported on 04/14/2015) 20 tablet 1 Not Taking at Unknown time  . promethazine (PHENERGAN) 25 MG tablet Take 0.5-1 tablets (12.5-25 mg total) by mouth every 6 (six) hours as needed. (Patient not taking: Reported on 04/14/2015) 30 tablet 0 Not Taking at Unknown time     Review of Systems  All systems reviewed and negative except as stated in HPI  Temp(Src)  98.4 F (36.9 C) (Oral)  Resp 16  LMP 07/05/2014 General appearance: alert, cooperative and no distress Lungs: normal work of breathing Heart: regular rate  Abdomen: gravid, soft, non-tender; FH 33cm Pelvic: Adequate Extremities: Homans sign is negative, no sign of DVT, edema Presentation: cephalic; confirmed with bedside US Fetal monitoring: Baseline: 135 bpm, Variability: Good {> 6 bpm), Accelerations: Reactive and Decelerations: Absent Uterine activity: None Dilation: Closed Effacement (%): 50 Exam by:: Lawanna Kobus, MD   Prenatal labs: ABO, Rh: --/--/O POS (08/29 2015) Antibody: NEG (08/29 2015) Rubella:  Immune RPR: NON REAC (06/20 1222)  HBsAg: NEGATIVE (03/23 1513)  HIV: REACTIVE (06/20 1222)  GBS:   Unknown 1 hr Glucola 105 Genetic screening normal Anatomy US poor fetal growth  Prenatal Transfer Tool  Maternal Diabetes: No Genetic Screening: Normal Maternal Ultrasounds/Referrals: Abnormal:  Findings:   IUGR Fetal Ultrasounds or other Referrals:  Referred to Materal Fetal Medicine  Maternal Substance Abuse:  No Significant Maternal Medications:  None Significant Maternal Lab Results: Lab values include: Other: GBS unknown  Results for orders placed or performed during the hospital encounter of 04/14/15 (from the past 24 hour(s))  CBC   Collection Time: 04/14/15  8:15 PM  Result Value Ref Range   WBC 6.4 4.0 - 10.5 K/uL   RBC 2.85 (L) 3.87 - 5.11 MIL/uL   Hemoglobin 7.2 (L) 12.0 - 15.0 g/dL   HCT 96.0 (L) 45.4 - 09.8 %   MCV 80.4 78.0 - 100.0 fL   MCH 25.3 (L) 26.0 - 34.0 pg   MCHC 31.4 30.0 - 36.0 g/dL   RDW 11.9 14.7 - 82.9 %   Platelets 227 150 - 400 K/uL  Type and screen   Collection Time: 04/14/15  8:15 PM  Result Value Ref Range   ABO/RH(D) O POS    Antibody Screen NEG    Sample Expiration 04/17/2015   Results for orders placed or performed in visit on 04/14/15 (from the past 24 hour(s))  POCT urinalysis dip (device)   Collection Time: 04/14/15  3:21 PM  Result Value Ref Range   Glucose, UA NEGATIVE NEGATIVE mg/dL   Bilirubin Urine NEGATIVE NEGATIVE   Ketones, ur NEGATIVE NEGATIVE mg/dL   Specific Gravity, Urine <=1.005 1.005 - 1.030   Hgb urine dipstick NEGATIVE NEGATIVE   pH 5.5 5.0 - 8.0   Protein, ur NEGATIVE NEGATIVE mg/dL   Urobilinogen, UA 0.2 0.0 - 1.0 mg/dL   Nitrite NEGATIVE NEGATIVE   Leukocytes, UA NEGATIVE NEGATIVE    Patient Active Problem List   Diagnosis Date Noted  . IUGR (intrauterine growth restriction) 04/14/2015  . [redacted] weeks gestation  of pregnancy   . [redacted] weeks gestation of pregnancy   . [redacted] weeks gestation of pregnancy   . Maternal care for poor fetal growth   . Poor fetal growth affecting management of mother in third trimester, antepartum   . Poor fetal growth affecting management of mother in third trimester   . Fetal size inconsistent with dates 02/03/2015  . UTI in pregnancy 11/06/2014  . Supervision of normal first pregnancy in second trimester 11/06/2014    Assessment: Deanna Sullivan is a 19 y.o. G1P0 at [redacted]w[redacted]d here for IOL 2/2 IUGR and poor prenatal care.  Admit to [facility] #Labor: IOL with cytotec. Place FB when able.  #Pain: Plan for epidural but labor su[pport for now.  #FWB: Category 1 #ID: GBS unknown; PCR collected #MOF:  Breast/Bottle #MOC: undecided; considering inpatient Nexplanin #Circ:  Yes, outpatient  Caryl Ada, DO 04/14/2015, 9:52 PM PGY-2, [facility]  OB FELLOW HISTORY AND PHYSICAL ATTESTATION  I have seen and examined this patient; I agree with above documentation in the resident's note.   Deanna Sullivan is a 19 y.o. G1P0, hx IUGR this pregnancy (though most recent u/s showing no iugr), scant prenatal care and not compliant with prenatal ultrasounds, here for measurng 33 cm at [redacted] weeks gestation concerning for IUGR.  PE: BP 119/74 mmHg  Pulse 93  Temp(Src) 98.4 F (36.9 C) (Oral)  Resp 16  Ht 5\' 4"  (1.626 m)  Wt 140 lb (63.504 kg)  BMI 24.02 kg/m2  LMP 07/05/2014 Gen: calm comfortable, NAD Resp: normal effort, no distress Abd: gravid  ROS, labs, PMH reviewed  Plan: Admit for induction of labor. GBS pcr pending.  Cherrie Gauze Wouk 04/15/2015, 12:46 AM

## 2015-04-14 NOTE — Patient Instructions (Signed)
Labor Induction  Labor induction is when steps are taken to cause a pregnant woman to begin the labor process. Most women go into labor on their own between 37 weeks and 42 weeks of the pregnancy. When this does not happen or when there is a medical need, methods may be used to induce labor. Labor induction causes a pregnant woman's uterus to contract. It also causes the cervix to soften (ripen), open (dilate), and thin out (efface). Usually, labor is not induced before 39 weeks of the pregnancy unless there is a problem with the baby or mother.  Before inducing labor, your health care provider will consider a number of factors, including the following:  The medical condition of you and the baby.   How many weeks along you are.   The status of the baby's lung maturity.   The condition of the cervix.   The position of the baby.  WHAT ARE THE REASONS FOR LABOR INDUCTION? Labor may be induced for the following reasons:  The health of the baby or mother is at risk.   The pregnancy is overdue by 1 week or more.   The water breaks but labor does not start on its own.   The mother has a health condition or serious illness, such as high blood pressure, infection, placental abruption, or diabetes.  The amniotic fluid amounts are low around the baby.   The baby is distressed.  Convenience or wanting the baby to be born on a certain date is not a reason for inducing labor. WHAT METHODS ARE USED FOR LABOR INDUCTION? Several methods of labor induction may be used, such as:   Prostaglandin medicine. This medicine causes the cervix to dilate and ripen. The medicine will also start contractions. It can be taken by mouth or by inserting a suppository into the vagina.   Inserting a thin tube (catheter) with a balloon on the end into the vagina to dilate the cervix. Once inserted, the balloon is expanded with water, which causes the cervix to open.   Stripping the membranes. Your health  care provider separates amniotic sac tissue from the cervix, causing the cervix to be stretched and causing the release of a hormone called progesterone. This may cause the uterus to contract. It is often done during an office visit. You will be sent home to wait for the contractions to begin. You will then come in for an induction.   Breaking the water. Your health care provider makes a hole in the amniotic sac using a small instrument. Once the amniotic sac breaks, contractions should begin. This may still take hours to see an effect.   Medicine to trigger or strengthen contractions. This medicine is given through an IV access tube inserted into a vein in your arm.  All of the methods of induction, besides stripping the membranes, will be done in the hospital. Induction is done in the hospital so that you and the baby can be carefully monitored.  HOW LONG DOES IT TAKE FOR LABOR TO BE INDUCED? Some inductions can take up to 2-3 days. Depending on the cervix, it usually takes less time. It takes longer when you are induced early in the pregnancy or if this is your first pregnancy. If a mother is still pregnant and the induction has been going on for 2-3 days, either the mother will be sent home or a cesarean delivery will be needed. WHAT ARE THE RISKS ASSOCIATED WITH LABOR INDUCTION? Some of the risks of induction   include:   Changes in fetal heart rate, such as too high, too low, or erratic.   Fetal distress.   Chance of infection for the mother and baby.   Increased chance of having a cesarean delivery.   Breaking off (abruption) of the placenta from the uterus (rare).   Uterine rupture (very rare).  When induction is needed for medical reasons, the benefits of induction may outweigh the risks. WHAT ARE SOME REASONS FOR NOT INDUCING LABOR? Labor induction should not be done if:   It is shown that your baby does not tolerate labor.   You have had previous surgeries on your  uterus, such as a myomectomy or the removal of fibroids.   Your placenta lies very low in the uterus and blocks the opening of the cervix (placenta previa).   Your baby is not in a head-down position.   The umbilical cord drops down into the birth canal in front of the baby. This could cut off the baby's blood and oxygen supply.   You have had a previous cesarean delivery.   There are unusual circumstances, such as the baby being extremely premature.  Document Released: 12/22/2006 Document Revised: 04/04/2013 Document Reviewed: 03/01/2013 Exit[employer] Patient Information 2015 [employer]. This information is not intended to replace advice given to you by your health care provider. Make sure you discuss any questions you have with your health care provider.  

## 2015-04-14 NOTE — Progress Notes (Signed)
Subjective:  Deanna Sullivan is a 19 y.o. G1P0 at [redacted]w[redacted]d being seen today for ongoing prenatal care. She is noncompliant with visits.  She is measuring 33cm FH. She will be medically induced today. Merry Lofty (ch RN) called and pt put on the list. Will do Nst today in the office.Patient reports no complaints.  Contractions: Not present.  Vag. Bleeding: None. Movement: Present. Denies leaking of fluid.   The following portions of the patient's history were reviewed and updated as appropriate: allergies, current medications, past family history, past medical history, past social history, past surgical history and problem list.   Objective:   Filed Vitals:   04/14/15 1525  BP: 133/89  Pulse: 87  Temp: 98.4 F (36.9 C)  Weight: 140 lb 11.2 oz (63.821 kg)    Fetal Status: Fetal Heart Rate (bpm): 128   Movement: Present     General:  Alert, oriented and cooperative. Patient is in no acute distress.  Skin: Skin is warm and dry. No rash noted.   Cardiovascular: Normal heart rate noted  Respiratory: Normal respiratory effort, no problems with respiration noted  Abdomen: Soft, gravid, appropriate for gestational age. Pain/Pressure: Present     Pelvic: Vag. Bleeding: None     Cervical exam performed        Extremities: Normal range of motion.  Edema: Trace  Mental Status: Normal mood and affect. Normal behavior. Normal judgment and thought content.   Urinalysis: Urine Protein: Negative Urine Glucose: Negative  Assessment and Plan:  Pregnancy: G1P0 at [redacted]w[redacted]d Nst MIOL- IUGR   There are no diagnoses linked to this encounter. Term labor symptoms and general obstetric precautions including but not limited to vaginal bleeding, contractions, leaking of fluid and fetal movement were reviewed in detail with the patient. Please refer to After Visit Summary for other counseling recommendations.  No Follow-up on file.   Rhea Pink, CNM

## 2015-04-15 ENCOUNTER — Encounter (HOSPITAL_COMMUNITY): Payer: Self-pay | Admitting: *Deleted

## 2015-04-15 DIAGNOSIS — O0933 Supervision of pregnancy with insufficient antenatal care, third trimester: Secondary | ICD-10-CM

## 2015-04-15 DIAGNOSIS — Z3A40 40 weeks gestation of pregnancy: Secondary | ICD-10-CM

## 2015-04-15 DIAGNOSIS — O99824 Streptococcus B carrier state complicating childbirth: Secondary | ICD-10-CM

## 2015-04-15 LAB — CBC
HEMATOCRIT: 18 % — AB (ref 36.0–46.0)
HEMOGLOBIN: 5.7 g/dL — AB (ref 12.0–15.0)
MCH: 25.1 pg — ABNORMAL LOW (ref 26.0–34.0)
MCHC: 31.7 g/dL (ref 30.0–36.0)
MCV: 79.3 fL (ref 78.0–100.0)
Platelets: 187 10*3/uL (ref 150–400)
RBC: 2.27 MIL/uL — AB (ref 3.87–5.11)
RDW: 14.7 % (ref 11.5–15.5)
WBC: 10.6 10*3/uL — ABNORMAL HIGH (ref 4.0–10.5)

## 2015-04-15 LAB — ABO/RH: ABO/RH(D): O POS

## 2015-04-15 LAB — RPR: RPR: NONREACTIVE

## 2015-04-15 LAB — PREPARE RBC (CROSSMATCH)

## 2015-04-15 MED ORDER — PHENYLEPHRINE 40 MCG/ML (10ML) SYRINGE FOR IV PUSH (FOR BLOOD PRESSURE SUPPORT)
80.0000 ug | PREFILLED_SYRINGE | INTRAVENOUS | Status: DC | PRN
  Filled 2015-04-15: qty 2

## 2015-04-15 MED ORDER — DIPHENHYDRAMINE HCL 50 MG/ML IJ SOLN: 12.5000 mg | INTRAMUSCULAR | Status: DC | PRN

## 2015-04-15 MED ORDER — SODIUM CHLORIDE 0.9 % IV SOLN: Freq: Once | INTRAVENOUS | Status: DC

## 2015-04-15 MED ORDER — ZOLPIDEM TARTRATE 5 MG PO TABS: 5.0000 mg | ORAL_TABLET | Freq: Every evening | ORAL | Status: DC | PRN

## 2015-04-15 MED ORDER — OXYCODONE-ACETAMINOPHEN 5-325 MG PO TABS
1.0000 | ORAL_TABLET | ORAL | Status: DC | PRN
  Administered 2015-04-15 – 2015-04-17 (×3): 1 via ORAL
  Filled 2015-04-15 (×3): qty 1

## 2015-04-15 MED ORDER — FENTANYL 2.5 MCG/ML BUPIVACAINE 1/10 % EPIDURAL INFUSION (WH - ANES): 14.0000 mL/h | INTRAMUSCULAR | Status: DC | PRN

## 2015-04-15 MED ORDER — MISOPROSTOL 200 MCG PO TABS
ORAL_TABLET | ORAL | Status: AC
  Administered 2015-04-15: 1000 ug
  Filled 2015-04-15: qty 5

## 2015-04-15 MED ORDER — FENTANYL 2.5 MCG/ML BUPIVACAINE 1/10 % EPIDURAL INFUSION (WH - ANES)
INTRAMUSCULAR | Status: AC
  Filled 2015-04-15: qty 125

## 2015-04-15 MED ORDER — LANOLIN HYDROUS EX OINT: TOPICAL_OINTMENT | CUTANEOUS | Status: DC | PRN

## 2015-04-15 MED ORDER — TETANUS-DIPHTH-ACELL PERTUSSIS 5-2.5-18.5 LF-MCG/0.5 IM SUSP: 0.5000 mL | Freq: Once | INTRAMUSCULAR | Status: DC

## 2015-04-15 MED ORDER — OXYCODONE-ACETAMINOPHEN 5-325 MG PO TABS
2.0000 | ORAL_TABLET | ORAL | Status: DC | PRN
  Administered 2015-04-15 – 2015-04-16 (×4): 2 via ORAL
  Filled 2015-04-15 (×5): qty 2

## 2015-04-15 MED ORDER — DIPHENHYDRAMINE HCL 25 MG PO CAPS: 25.0000 mg | ORAL_CAPSULE | Freq: Four times a day (QID) | ORAL | Status: DC | PRN

## 2015-04-15 MED ORDER — SIMETHICONE 80 MG PO CHEW: 80.0000 mg | CHEWABLE_TABLET | ORAL | Status: DC | PRN

## 2015-04-15 MED ORDER — BENZOCAINE-MENTHOL 20-0.5 % EX AERO
1.0000 "application " | INHALATION_SPRAY | CUTANEOUS | Status: DC | PRN
  Administered 2015-04-15: 1 via TOPICAL
  Filled 2015-04-15: qty 56

## 2015-04-15 MED ORDER — PRENATAL MULTIVITAMIN CH
1.0000 | ORAL_TABLET | Freq: Every day | ORAL | Status: DC
  Administered 2015-04-15 – 2015-04-17 (×3): 1 via ORAL
  Filled 2015-04-15 (×3): qty 1

## 2015-04-15 MED ORDER — SENNOSIDES-DOCUSATE SODIUM 8.6-50 MG PO TABS
2.0000 | ORAL_TABLET | ORAL | Status: DC
  Administered 2015-04-17: 2 via ORAL
  Filled 2015-04-15 (×2): qty 2

## 2015-04-15 MED ORDER — ONDANSETRON HCL 4 MG/2ML IJ SOLN: 4.0000 mg | INTRAMUSCULAR | Status: DC | PRN

## 2015-04-15 MED ORDER — WITCH HAZEL-GLYCERIN EX PADS: 1.0000 "application " | MEDICATED_PAD | CUTANEOUS | Status: DC | PRN

## 2015-04-15 MED ORDER — SODIUM CHLORIDE 0.9 % IV SOLN
Freq: Once | INTRAVENOUS | Status: AC
  Administered 2015-04-15: 21:00:00 via INTRAVENOUS

## 2015-04-15 MED ORDER — PHENYLEPHRINE 40 MCG/ML (10ML) SYRINGE FOR IV PUSH (FOR BLOOD PRESSURE SUPPORT)
PREFILLED_SYRINGE | INTRAVENOUS | Status: AC
  Filled 2015-04-15: qty 20

## 2015-04-15 MED ORDER — INFLUENZA VAC SPLIT QUAD 0.5 ML IM SUSY
0.5000 mL | PREFILLED_SYRINGE | INTRAMUSCULAR | Status: AC
  Administered 2015-04-16: 0.5 mL via INTRAMUSCULAR
  Filled 2015-04-15: qty 0.5

## 2015-04-15 MED ORDER — ONDANSETRON HCL 4 MG PO TABS: 4.0000 mg | ORAL_TABLET | ORAL | Status: DC | PRN

## 2015-04-15 MED ORDER — ACETAMINOPHEN 325 MG PO TABS: 650.0000 mg | ORAL_TABLET | ORAL | Status: DC | PRN

## 2015-04-15 MED ORDER — EPHEDRINE 5 MG/ML INJ
10.0000 mg | INTRAVENOUS | Status: DC | PRN
  Filled 2015-04-15: qty 2

## 2015-04-15 MED ORDER — DIBUCAINE 1 % RE OINT: 1.0000 "application " | TOPICAL_OINTMENT | RECTAL | Status: DC | PRN

## 2015-04-15 MED ORDER — IBUPROFEN 600 MG PO TABS
600.0000 mg | ORAL_TABLET | Freq: Four times a day (QID) | ORAL | Status: DC
  Administered 2015-04-15 – 2015-04-17 (×9): 600 mg via ORAL
  Filled 2015-04-15 (×10): qty 1

## 2015-04-15 NOTE — Progress Notes (Signed)
Called by RN about repeat hgb. Lab Results  Component Value Date   HGB 5.7* 04/15/2015   HGB 7.2* 04/14/2015   HGB 9.4* 02/03/2015   Patient had PPH and this is expected but needs transfusion. Already has two units on hold. Transfuses 2uPRBC with repeat post-transfusion h/h.  Federico Flake, MD

## 2015-04-15 NOTE — Progress Notes (Signed)
Patient ordered 2 units of PRBC. Started the first unit at 1530, vital signs wnl before transfusion started and 15 minutes after start of transfusion. Checked vital signs at the end of the first unit of blood because the patient complained of being hot. Temp 100.7 other vital signs wnl. Notified Dr. Natale Milch of situation. Waiting on MD  for further orders before second transfusion.

## 2015-04-15 NOTE — Consult Note (Addendum)
Neonatology Note:   Attendance at Delivery:    I was asked by Dr. Adrian Blackwater to attend this NSVD at term due to suspected placental abruption and known IUGR fetus. The mother is a G1P0 O pos, GBS not done yet with limited PNC and poor compliance with recommended follow-up. HIV screening positive, but confirmatory Ab testing negative 6/20. She did not get any antibiotics during labor and she remained afebrile. No fluid seen, so timing of ROM is not known; baby's skin without staining, so assume fluid was clear. Infant vigorous with good spontaneous cry and tone. Delayed cord clamping was done. Needed no suctioning. Ap 9/9. Lungs clear to ausc in DR. To CN to care of Pediatrician.   Doretha Sou, MD

## 2015-04-15 NOTE — Lactation Note (Signed)
This note was copied from the chart of Deanna Sullivan. Lactation Consultation Note  Patient Name: Deanna Sullivan [id number] Date: 04/15/2015 Reason for consult: Initial assessment Mom had possible partial abruption during labor, EBL 950 cc, getting PRBC today, Hgb 5.7. Mom planning to BR/BO. Baby just coming off the breast when I arrived. Mom has supplemented with formula 2 times. Basic teaching reviewed with Mom. Risk of early supplementation discussed. Stressed importance to [employer] of baby being at the breast with each feeding 8-12 times or more in 24 hours to encourage milk production, prevent engorgement and protect milk supply especially in light of blood loss with delivery before giving any supplement.  Offered to set up DEBP for Mom to post pump when able, Mom declined. Mom has supplemental guidelines per hours of age. Lactation brochure left for review, advised of OP services and support group. Encouraged to call for assist as needed with latch.   Maternal Data Has patient been taught Hand Expression?: Yes Does the patient have breastfeeding experience prior to this delivery?: No  Feeding Feeding Type: Breast Fed Length of feed: 5 min  LATCH Score/Interventions                      Lactation Tools Discussed/Used WIC Program: Yes   Consult Status Consult Status: Follow-up Date: 04/16/15 Follow-up type: In-patient    Deanna Sullivan 04/15/2015, 4:57 PM

## 2015-04-15 NOTE — Progress Notes (Signed)
Patient ID: Deanna Sullivan, female   DOB: 05-06-1996, 19 y.o.   MRN: [id number]  Called to patient's bedside - had heavy bleeding from vagina. When I arrived, the bleeding had stopped.  In questioning the nurse, the bleeding was not watery. Cervical exam was done: 3/90/-1.  Bedside US confirmed fetal heartrate of 120s, posterior placenta, and no discernable amniotic fluid.  FHT: 125, mod variability, no accels, no decels.  Foley balloon was in process of being removed when I arrived and I finished removing it.  Due to tetanic uterine contraction, a single dose of terbutaline was given.  FHT remains stable.  Will wait 4 hours from last cytotec and start pitocin.  Unknown whether oligohydramnios vs SROM.  Patient very uncomfortable - I do not think she has a uterine rupture.  Likely partial abruption.  Nurses to place FSE as patient movement makes it difficult to trace the baby.  Will cautiously continue towards vaginal delivery.  Levie Heritage, DO 04/15/2015 4:23 AM

## 2015-04-16 DIAGNOSIS — Z30018 Encounter for initial prescription of other contraceptives: Secondary | ICD-10-CM

## 2015-04-16 DIAGNOSIS — Z975 Presence of (intrauterine) contraceptive device: Secondary | ICD-10-CM

## 2015-04-16 LAB — TYPE AND SCREEN
ABO/RH(D): O POS
Antibody Screen: NEGATIVE
UNIT DIVISION: 0
UNIT DIVISION: 0

## 2015-04-16 LAB — CBC
HCT: 25.2 % — ABNORMAL LOW (ref 36.0–46.0)
HEMOGLOBIN: 8.4 g/dL — AB (ref 12.0–15.0)
MCH: 27.2 pg (ref 26.0–34.0)
MCHC: 33.3 g/dL (ref 30.0–36.0)
MCV: 81.6 fL (ref 78.0–100.0)
PLATELETS: 212 10*3/uL (ref 150–400)
RBC: 3.09 MIL/uL — AB (ref 3.87–5.11)
RDW: 15.3 % (ref 11.5–15.5)
WBC: 19.6 10*3/uL — AB (ref 4.0–10.5)

## 2015-04-16 MED ORDER — LIDOCAINE HCL 1 % IJ SOLN
0.0000 mL | Freq: Once | INTRAMUSCULAR | Status: AC | PRN
  Administered 2015-04-16: 20 mL via INTRADERMAL
  Filled 2015-04-16: qty 20

## 2015-04-16 MED ORDER — ETONOGESTREL 68 MG ~~LOC~~ IMPL
68.0000 mg | DRUG_IMPLANT | Freq: Once | SUBCUTANEOUS | Status: AC
  Administered 2015-04-16: 68 mg via SUBCUTANEOUS
  Filled 2015-04-16: qty 1

## 2015-04-16 NOTE — Progress Notes (Signed)
POSTPARTUM PROGRESS NOTE  Post Partum Day 1 Subjective:  Deanna Sullivan is a 19 y.o. G1P1001 [redacted]w[redacted]d s/p NSVD c/b PPH. No problems with PRBC transfusion, now with much more energy..  No acute events overnight.  Pt denies problems with ambulating, voiding or po intake.  She denies nausea or vomiting.  Pain is well controlled.  She has had flatus. She has not had bowel movement.  Lochia Minimal.  Plan for birth control is nexplanon.  Method of Feeding: both  Objective: Blood pressure 127/74, pulse 68, temperature 98.2 F (36.8 C), temperature source Oral, resp. rate 18, height 5\' 4"  (1.626 m), weight 140 lb (63.504 kg), last menstrual period 07/05/2014, SpO2 100 %, unknown if currently breastfeeding.  Physical Exam:  General: alert, cooperative and no distress Lochia:normal flow Chest: CTAB Heart: RRR no m/r/g Abdomen: +BS, soft, nontender,  Uterine Fundus: firm,  DVT Evaluation: No evidence of DVT seen on physical exam. Extremities: no edema   Recent Labs  04/15/15 1314 04/16/15 0632  HGB 5.7* 8.4*  HCT 18.0* 25.2*    Assessment/Plan:  ASSESSMENT: Deanna Sullivan is a 19 y.o. G1P1001 [redacted]w[redacted]d s/p nsvd c/b pph. Bleeding wnl now, fundus firm, H corrected appropriately after transfusion 2 units. Wants nexplanon, will plan to place today or tomorrow.  Plan for discharge tomorrow   LOS: 2 days   Silvano Bilis 04/16/2015, 7:53 AM

## 2015-04-16 NOTE — Procedures (Signed)
Nexplanon insertion left arm Written informed consent obtained Site marked and prepped in standard fashion 2.5 ml 1% lidocaine w/o epinephrine infused Betadine applied Nexplanon inserted according to manufacturer's instructions Bleeding minimal Steri-strip and pressure dressing applied No complications, patient tolerated procedure well

## 2015-04-17 MED ORDER — FERROUS SULFATE 325 (65 FE) MG PO TABS: 325.0000 mg | ORAL_TABLET | Freq: Two times a day (BID) | ORAL | Status: DC

## 2015-04-17 MED ORDER — IBUPROFEN 600 MG PO TABS: 600.0000 mg | ORAL_TABLET | Freq: Four times a day (QID) | ORAL | Status: DC

## 2015-04-17 NOTE — Progress Notes (Signed)
Discharge instructions and paper reviewed with patient and her mother. Denies questions at this time. Olene Floss will be going to the store to buy a car seat for the baby to go home.

## 2015-04-17 NOTE — Lactation Note (Signed)
This note was copied from the chart of Deanna Sullivan. Lactation Consultation Note; Mom reports baby has been feeding well except for some pain with initial latch but then eases of/ reports breasts are feeling fuller this morning. Encouraged to always breast feed first then give formula if baby still hungry after nursing bu as she makes more milk will probably not need formula. Reviewed engorgement prevention and treatment. No questions at present To call prn  Patient Name: Deanna Micahya Dombrosky [id number] Date: 04/17/2015 Reason for consult: Follow-up assessment   Maternal Data Formula Feeding for Exclusion: Yes Reason for exclusion: Mother's choice to formula and breast feed on admission  Feeding  LATCH Score/Interventions                      Lactation Tools Discussed/Used     Consult Status Consult Status: Complete    Pamelia Hoit 04/17/2015, 9:49 AM

## 2015-04-17 NOTE — Discharge Summary (Signed)
Obstetric Discharge Summary  Reason for Admission: onset of labor Prenatal Procedures: ultrasound Intrapartum Procedures: spontaneous vaginal delivery Postpartum Procedures: transfusion 2 units prbcs Complications-Operative and Postpartum: hemorrhage   Expand All Collapse All   Patient is 19 y.o. G1P0 [redacted]w[redacted]d admitted for IOL 2/2 IUGR. Hx of poor prenatal care follow-up during pregnancy. No complications this pregnancy other than poor fetal growth.   Prior to entering patient's room for delivery, attending was called to bedisde due to heavy vaginal bleeding and unreassuring FHT. Foley bulb was removed at that time and patient was 3/90/-1. It was diagnosed as a suspected placental abruption and possible ROM. Upon arrival one hour later patient was complete and pushing. She pushed with good maternal effort to deliver a healthy baby boy. NICU team was present at delivery due to recurrent deep variables, suspected abruption, and IUGR. Baby delivered without difficulty. Pitocin was started and uterus massaged until bleeding slowed. Patient also given cytotec 1000mg  PR due to increased vaginal bleeding.   Delivery Note At 5:09 AM a viable female was delivered via Vaginal, Spontaneous Delivery (Presentation: ROA ) with nuchal x1. APGAR: 9, 9; weight pending. Placenta status: Intact, Spontaneous. Will send placenta to pathology. Cord: 3 vessels with the following complications: None. Cord pH: not collected.   Anesthesia: None Episiotomy: None Lacerations: 2nd degree perineal  Suture Repair: 3.0 vicryl Est. Blood Loss (mL): 950  Mom to postpartum. Baby to Couplet care / Skin to Skin.        Hospital Course:  Active Problems:   IUGR (intrauterine growth restriction)   NSVD (normal spontaneous vaginal delivery)   Contraceptive management   Deanna Sullivan is a 19 y.o. G1P1001 s/p nsvd.  Patient was admitted in active labor. Her pregnancy was complicated by IUGR and had limited prenatal  care, including no prenatal care the 6 weeks prior to delivery. She was anemic to H 7.2 on admission. Her intraprtum course was c/b probably mild abruption. Her PP course was complicated by hemorrhage or 1000 ml. Her H nadired at 5.7. She received 2 units PRBCs and her H increased to 8.4. For GBS positive status she was untreated as she delivered precipitiously. She is asymptomatic from her anemia. Nexplanon was placed for contraception. The pt feels ready to go home and  will be discharged with outpatient follow-up.   Today: No acute events overnight.  Pt denies problems with ambulating, voiding or po intake.  She denies nausea or vomiting.  Pain is well controlled.  She has had flatus. She has not had bowel movement.  Lochia Small.  Plan for birth control is   Physical Exam:  General: alert, cooperative and appears stated age 67: appropriate Uterine Fundus: firm Incision: n/a DVT Evaluation: No evidence of DVT seen on physical exam.  H/H: Lab Results  Component Value Date/Time   HGB 8.4* 04/16/2015 06:32 AM   HCT 25.2* 04/16/2015 06:32 AM    Discharge Diagnoses: Term Pregnancy-delivered and postpartum hemorrhage and abruption and anemia  Discharge Information: Date: 04/17/2015 Activity: pelvic rest Diet: routine  Medications: PNV, Ibuprofen and Iron Breast feeding:  Yes Condition: stable Instructions: refer to handout Discharge to: home   Discharge Instructions    Call MD for:  difficulty breathing, headache or visual disturbances    Complete by:  As directed      Call MD for:  extreme fatigue    Complete by:  As directed      Call MD for:  hives    Complete by:  As  directed      Call MD for:  persistant dizziness or light-headedness    Complete by:  As directed      Call MD for:  persistant nausea and vomiting    Complete by:  As directed      Call MD for:  severe uncontrolled pain    Complete by:  As directed      Call MD for:  temperature >100.4    Complete by:  As  directed      Call MD for:    Complete by:  As directed      Diet general    Complete by:  As directed      Sexual acrtivity    Complete by:  As directed   Nothing in the vagina for at least 2 weeks            Medication List    STOP taking these medications        cephALEXin 500 MG capsule  Commonly known as:  KEFLEX     ondansetron 4 MG disintegrating tablet  Commonly known as:  ZOFRAN ODT     promethazine 25 MG tablet  Commonly known as:  PHENERGAN      TAKE these medications        acetaminophen 500 MG tablet  Commonly known as:  TYLENOL  Take 500 mg by mouth every 6 (six) hours as needed for moderate pain or headache.     calcium carbonate 500 MG chewable tablet  Commonly known as:  TUMS - dosed in mg elemental calcium  Chew 1 tablet by mouth daily as needed for indigestion or heartburn.     ferrous sulfate 325 (65 FE) MG tablet  Commonly known as:  FERROUSUL  Take 1 tablet (325 mg total) by mouth 2 (two) times daily.     ibuprofen 600 MG tablet  Commonly known as:  ADVIL,MOTRIN  Take 1 tablet (600 mg total) by mouth every 6 (six) hours.     PRENATAL COMPLETE 14-0.4 MG Tabs  Take as directed           Follow-up Information    Follow up with [facility] In 6 weeks.   Specialty:  Obstetrics and Gynecology   Contact information:   [address] [place] [address] [phone]      Silvano Bilis ,MD OB Fellow 04/17/2015,7:55 AM

## 2015-04-17 NOTE — Progress Notes (Signed)
Notifed Dr Ashok Pall patient requesting pain medication to go home with.

## 2015-04-17 NOTE — Discharge Instructions (Signed)

## 2015-04-21 ENCOUNTER — Encounter (HOSPITAL_COMMUNITY): Payer: Self-pay | Admitting: Emergency Medicine

## 2015-04-21 ENCOUNTER — Emergency Department (HOSPITAL_COMMUNITY): Payer: Medicaid Other

## 2015-04-21 ENCOUNTER — Inpatient Hospital Stay (HOSPITAL_COMMUNITY)
Admission: EM | Admit: 2015-04-21 | Discharge: 2015-04-23 | DRG: 769 | Disposition: A | Discharge status: Home / Self Care | Payer: Medicaid Other | Attending: Obstetrics & Gynecology | Admitting: Obstetrics & Gynecology

## 2015-04-21 DIAGNOSIS — Z87891 Personal history of nicotine dependence: Secondary | ICD-10-CM

## 2015-04-21 DIAGNOSIS — O8612 Endometritis following delivery: Secondary | ICD-10-CM | POA: Diagnosis present

## 2015-04-21 DIAGNOSIS — N719 Inflammatory disease of uterus, unspecified: Secondary | ICD-10-CM | POA: Diagnosis present

## 2015-04-21 DIAGNOSIS — N710 Acute inflammatory disease of uterus: Secondary | ICD-10-CM | POA: Diagnosis not present

## 2015-04-21 HISTORY — DX: Anemia, unspecified: D64.9

## 2015-04-21 LAB — CBC
HEMATOCRIT: 27.1 % — AB (ref 36.0–46.0)
Hemoglobin: 8.7 g/dL — ABNORMAL LOW (ref 12.0–15.0)
MCH: 26.4 pg (ref 26.0–34.0)
MCHC: 32.1 g/dL (ref 30.0–36.0)
MCV: 82.4 fL (ref 78.0–100.0)
PLATELETS: 398 10*3/uL (ref 150–400)
RBC: 3.29 MIL/uL — AB (ref 3.87–5.11)
RDW: 15.3 % (ref 11.5–15.5)
WBC: 6.6 10*3/uL (ref 4.0–10.5)

## 2015-04-21 LAB — COMPREHENSIVE METABOLIC PANEL
ALT: 24 U/L (ref 14–54)
AST: 28 U/L (ref 15–41)
Albumin: 2.5 g/dL — ABNORMAL LOW (ref 3.5–5.0)
Alkaline Phosphatase: 135 U/L — ABNORMAL HIGH (ref 38–126)
Anion gap: 8 (ref 5–15)
BUN: 9 mg/dL (ref 6–20)
CHLORIDE: 105 mmol/L (ref 101–111)
CO2: 23 mmol/L (ref 22–32)
CREATININE: 0.63 mg/dL (ref 0.44–1.00)
Calcium: 8.2 mg/dL — ABNORMAL LOW (ref 8.9–10.3)
GFR calc Af Amer: >60 mL/min (ref >60)
GFR calc non Af Amer: >60 mL/min (ref >60)
Glucose, Bld: 89 mg/dL (ref 65–99)
POTASSIUM: 3.1 mmol/L — AB (ref 3.5–5.1)
SODIUM: 136 mmol/L (ref 135–145)
Total Bilirubin: 0.3 mg/dL (ref 0.3–1.2)
Total Protein: 6.8 g/dL (ref 6.5–8.1)

## 2015-04-21 LAB — WET PREP, GENITAL
TRICH WET PREP: NONE SEEN
YEAST WET PREP: NONE SEEN

## 2015-04-21 LAB — URINALYSIS W MICROSCOPIC (NOT AT ARMC)
Bilirubin Urine: NEGATIVE
GLUCOSE, UA: NEGATIVE mg/dL
KETONES UR: NEGATIVE mg/dL
Nitrite: NEGATIVE
PH: 6.5 (ref 5.0–8.0)
Protein, ur: NEGATIVE mg/dL
SPECIFIC GRAVITY, URINE: 1.008 (ref 1.005–1.030)
Urobilinogen, UA: 1 mg/dL (ref 0.0–1.0)

## 2015-04-21 LAB — PROTIME-INR
INR: 1.23 (ref 0.00–1.49)
Prothrombin Time: 15.7 seconds — ABNORMAL HIGH (ref 11.6–15.2)

## 2015-04-21 LAB — TYPE AND SCREEN
ABO/RH(D): O POS
Antibody Screen: NEGATIVE

## 2015-04-21 LAB — LIPASE, BLOOD: LIPASE: 22 U/L (ref 22–51)

## 2015-04-21 LAB — APTT: APTT: 29 s (ref 24–37)

## 2015-04-21 MED ORDER — MORPHINE SULFATE (PF) 4 MG/ML IV SOLN
4.0000 mg | Freq: Once | INTRAVENOUS | Status: AC
  Administered 2015-04-21: 4 mg via INTRAVENOUS
  Filled 2015-04-21: qty 1

## 2015-04-21 MED ORDER — BISACODYL 5 MG PO TBEC
5.0000 mg | DELAYED_RELEASE_TABLET | Freq: Every day | ORAL | Status: DC | PRN
  Filled 2015-04-21: qty 1

## 2015-04-21 MED ORDER — FLEET ENEMA 7-19 GM/118ML RE ENEM: 1.0000 | ENEMA | Freq: Once | RECTAL | Status: DC | PRN

## 2015-04-21 MED ORDER — HYDROMORPHONE HCL 1 MG/ML IJ SOLN
1.0000 mg | INTRAMUSCULAR | Status: DC | PRN
  Administered 2015-04-22 (×2): 1 mg via INTRAVENOUS
  Filled 2015-04-21 (×2): qty 1

## 2015-04-21 MED ORDER — LACTATED RINGERS IV SOLN
INTRAVENOUS | Status: DC
  Administered 2015-04-22 – 2015-04-23 (×4): via INTRAVENOUS

## 2015-04-21 MED ORDER — ONDANSETRON HCL 4 MG PO TABS
4.0000 mg | ORAL_TABLET | Freq: Four times a day (QID) | ORAL | Status: DC | PRN
Start: 2015-04-21 — End: 2015-04-23

## 2015-04-21 MED ORDER — ALUM & MAG HYDROXIDE-SIMETH 200-200-20 MG/5ML PO SUSP: 30.0000 mL | ORAL | Status: DC | PRN

## 2015-04-21 MED ORDER — OXYCODONE-ACETAMINOPHEN 5-325 MG PO TABS
1.0000 | ORAL_TABLET | ORAL | Status: DC | PRN
  Filled 2015-04-21 (×2): qty 2

## 2015-04-21 MED ORDER — FAMOTIDINE 20 MG PO TABS
40.0000 mg | ORAL_TABLET | Freq: Once | ORAL | Status: AC
  Administered 2015-04-22: 40 mg via ORAL
  Filled 2015-04-21: qty 2

## 2015-04-21 MED ORDER — ONDANSETRON HCL 4 MG/2ML IJ SOLN: 4.0000 mg | Freq: Four times a day (QID) | INTRAMUSCULAR | Status: DC | PRN

## 2015-04-21 MED ORDER — PRENATAL MULTIVITAMIN CH
1.0000 | ORAL_TABLET | Freq: Every day | ORAL | Status: DC
  Administered 2015-04-22: 1 via ORAL
  Filled 2015-04-21: qty 1

## 2015-04-21 MED ORDER — DOCUSATE SODIUM 100 MG PO CAPS
100.0000 mg | ORAL_CAPSULE | Freq: Two times a day (BID) | ORAL | Status: DC
  Administered 2015-04-22 – 2015-04-23 (×3): 100 mg via ORAL
  Filled 2015-04-21 (×3): qty 1

## 2015-04-21 MED ORDER — METOCLOPRAMIDE HCL 10 MG PO TABS
10.0000 mg | ORAL_TABLET | Freq: Once | ORAL | Status: AC
  Administered 2015-04-22: 10 mg via ORAL
  Filled 2015-04-21: qty 1

## 2015-04-21 MED ORDER — FERROUS SULFATE 325 (65 FE) MG PO TABS
325.0000 mg | ORAL_TABLET | Freq: Two times a day (BID) | ORAL | Status: DC
  Administered 2015-04-22 – 2015-04-23 (×3): 325 mg via ORAL
  Filled 2015-04-21 (×3): qty 1

## 2015-04-21 MED ORDER — ONDANSETRON HCL 4 MG/2ML IJ SOLN
4.0000 mg | Freq: Once | INTRAMUSCULAR | Status: AC
  Administered 2015-04-21: 4 mg via INTRAVENOUS
  Filled 2015-04-21: qty 2

## 2015-04-21 MED ORDER — MAGNESIUM HYDROXIDE 400 MG/5ML PO SUSP: 30.0000 mL | Freq: Every day | ORAL | Status: DC | PRN

## 2015-04-21 MED ORDER — PIPERACILLIN-TAZOBACTAM 3.375 G IVPB 30 MIN
3.3750 g | Freq: Three times a day (TID) | INTRAVENOUS | Status: DC
  Filled 2015-04-21 (×2): qty 50

## 2015-04-21 MED ORDER — SODIUM CHLORIDE 0.9 % IV BOLUS (SEPSIS)
1000.0000 mL | Freq: Once | INTRAVENOUS | Status: AC
  Administered 2015-04-21: 1000 mL via INTRAVENOUS

## 2015-04-21 MED ORDER — ACETAMINOPHEN 500 MG PO TABS: 1000.0000 mg | ORAL_TABLET | Freq: Four times a day (QID) | ORAL | Status: DC | PRN

## 2015-04-21 MED ORDER — HYDROMORPHONE HCL 1 MG/ML IJ SOLN
0.5000 mg | Freq: Once | INTRAMUSCULAR | Status: AC
  Administered 2015-04-21: 0.5 mg via INTRAVENOUS
  Filled 2015-04-21: qty 1

## 2015-04-21 MED ORDER — ZOLPIDEM TARTRATE 5 MG PO TABS
5.0000 mg | ORAL_TABLET | Freq: Every evening | ORAL | Status: DC | PRN
  Administered 2015-04-22: 5 mg via ORAL
  Filled 2015-04-21: qty 1

## 2015-04-21 NOTE — ED Provider Notes (Signed)
CSN: [id number]     Arrival date & time 04/21/15  1816 History   First MD Initiated Contact with Patient 04/21/15 1832     Chief Complaint  Patient presents with  . Vaginal Bleeding  . Fatigue     (Consider location/radiation/quality/duration/timing/severity/associated sxs/prior Treatment) HPI 19 year old female who presents with vaginal bleeding, fatigue, and abdominal pain. She is 1 week postpartum from vaginal delivery. She had induction of labor due to intrauterine growth retardation. She had a precipitous delivery, that was complicated by peripartum and postpartum hemorrhage. She required 2 units of blood. At that time she was also found it to be GBS positive and was inadequately treated with antibiotics. I reports that since discharge from the hospital 2-3 days ago she's been having progressively worsening low abdominal pain with persistent vaginal bleeding, associated with fatigue and lightheadedness when ambulating. She has had fever of 102 Fahrenheit at home yesterday. Denies vomiting, diarrhea, foul-smelling vaginal discharge, urinary complaints, or upper respiratory complaints.  Past Medical History  Diagnosis Date  . Anemia    History reviewed. No pertinent past surgical history. Family History  Problem Relation Age of Onset  . Cancer Mother    Social History  Substance Use Topics  . Smoking status: Former Smoker -- 0.00 packs/day    Types: Cigars  . Smokeless tobacco: Never Used  . Alcohol Use: No     Comment: occassionally   OB History    Gravida Para Term Preterm AB TAB SAB Ectopic Multiple Living   0 1     Review of Systems 10/14 systems reviewed and are negative other than those stated in the HPI  Allergies  Review of patient's allergies indicates no known allergies.  Home Medications   Prior to Admission medications   Medication Sig Start Date End Date Taking? Authorizing Provider  acetaminophen (TYLENOL) 500 MG tablet Take 1,000 mg by mouth  every 6 (six) hours as needed for moderate pain or headache.    Yes Historical Provider, MD  ferrous sulfate (FERROUSUL) 325 (65 FE) MG tablet Take 1 tablet (325 mg total) by mouth 2 (two) times daily. 04/17/15  Yes Kathrynn Running, MD  ibuprofen (ADVIL,MOTRIN) 600 MG tablet Take 1 tablet (600 mg total) by mouth every 6 (six) hours. 04/17/15  Yes Kathrynn Running, MD  ondansetron (ZOFRAN-ODT) 4 MG disintegrating tablet Take 4 mg by mouth every 8 (eight) hours as needed for nausea or vomiting.  01/28/15  Yes Historical Provider, MD  Prenatal Vit-Fe Fumarate-FA (PRENATAL COMPLETE) 14-0.4 MG TABS Take as directed Patient not taking: Reported on 04/21/2015 02/18/15   Willodean Rosenthal, MD   BP 135/86 mmHg  Pulse 74  Temp(Src) 98.4 F (36.9 C) (Oral)  Resp 15  Ht  (1.626 m)  Wt 127 lb 11.2 oz (57.924 kg)  BMI 21.91 kg/m2  SpO2 99% Physical Exam Physical Exam  Nursing note and vitals reviewed. Constitutional: Well developed, well nourished, non-toxic, and in no acute distress Head: Normocephalic and atraumatic.  Mouth/Throat: Oropharynx is clear and moist.  Neck: Normal range of motion. Neck supple.  Cardiovascular: Normal rate and regular rhythm.  No edema. Pulmonary/Chest: Effort normal and breath sounds normal.  Abdominal: Soft. There is low abdominal tenderness to palpation. There is no rebound and no guarding.  Musculoskeletal: Normal range of motion.  Neurological: Alert, no facial droop, fluent speech, moves all extremities symmetrically Skin: Skin is warm and dry.  Psychiatric: Cooperative Pelvic: Normal external genitalia.  Normal internal genitalia. Purulent material mixed with blood in the vagina. Mild cervical motion tenderness. No adnexal masses or tenderness.  ED Course  Procedures (including critical care time) Labs Review Labs Reviewed  WET PREP, GENITAL - Abnormal; Notable for the following:    Clue Cells Wet Prep HPF POC FEW (*)    WBC, Wet Prep HPF POC TOO  NUMEROUS TO COUNT (*)    All other components within normal limits  COMPREHENSIVE METABOLIC PANEL - Abnormal; Notable for the following:    Potassium 3.1 (*)    Calcium 8.2 (*)    Albumin 2.5 (*)    Alkaline Phosphatase 135 (*)    All other components within normal limits  CBC - Abnormal; Notable for the following:    RBC 3.29 (*)    Hemoglobin 8.7 (*)    HCT 27.1 (*)    All other components within normal limits  PROTIME-INR - Abnormal; Notable for the following:    Prothrombin Time 15.7 (*)    All other components within normal limits  URINALYSIS W MICROSCOPIC - Abnormal; Notable for the following:    Color, Urine STRAW (*)    APPearance CLOUDY (*)    Hgb urine dipstick MODERATE (*)    Leukocytes, UA LARGE (*)    Bacteria, UA MANY (*)    Squamous Epithelial / LPF FEW (*)    All other components within normal limits  LIPASE, BLOOD  APTT  TYPE AND SCREEN  ABO/RH  GC/CHLAMYDIA PROBE AMP ([place]) NOT AT [facility]    Imaging Review US Pelvis Complete  04/21/2015   CLINICAL DATA:  19 year old female 1 week postpartum presenting with vaginal bleeding.  EXAM: LIMITED ULTRASOUND OF PELVIS  TECHNIQUE: Limited transabdominal ultrasound examination of the pelvis was performed.  COMPARISON:  Obstetrical ultrasound dated 03/07/2015  FINDINGS: The uterus is enlarged and heterogeneous measuring 15 x 8 x 9 cm. Doppler images demonstrate diffuse hyperemia of the endometrium and surrounding myometrial tissue. An ill-defined heterogeneous predominantly solid collection may be present within the uterus measuring approximately 6 cm in thickness and is concerning for retained products of conception. Echogenic material noted within the lower uterine segment measuring approximately 2.2 cm in thickness likely representing blood clot.  The right ovary measures 2.2 x 1.3 x 1.8 cm and the left ovary measures 3.1 x 1.9 x 3.2 cm. The ovaries are grossly unremarkable.  IMPRESSION: Enlarged and hyperemic uterus with  possible hyperemic collection. Findings may represent endometritis but are concerning for retained products of conception. Clinical correlation and obstetrical consult is advised.  These results were called by telephone at the time of interpretation on 04/21/2015 at 9:45 pm to Dr. Crista Curb , who verbally acknowledged these results.   Electronically Signed   By: Elgie Collard M.D.   On: 04/21/2015 21:45   I have personally reviewed and evaluated these images and lab results as part of my medical decision-making.   MDM   Final diagnoses:  Endometritis  Retained products of conception with hemorrhage    On arrival, she is nontoxic and in no acute distress. She is hemodynamically stable. She has a soft and non-peritoneal abdomen, with low abdominal tenderness to palpation. On pelvic exam, there is evidence of both purulent and bloody discharge. No active leading is noted from the cervix. There is concern for retained products of conception versus endometritis. Blood work reveals no major leukocytosis, stable anemia at 8.7. No concern for acute hemorrhage at this time given no active bleeding noted  from cervix, stable hemoglobin, and hemodynamic stability.  Pelvic ultrasound shows enlarged and hyperemic uterus with possible hyperemic collection. Findings may be concerning for endometritis, but radiology was more concerned for retained products of conception. I discussed this patient with Dr. Macon Large from [facility], who will admit this patient for IV antibiotics and potential D&C.  Lavera Guise, MD 04/21/15 [phone]

## 2015-04-21 NOTE — ED Notes (Signed)
Patient transported to Ultrasound 

## 2015-04-21 NOTE — ED Notes (Signed)
This nurse was wheeling another pt out for discharge.  When I arrived back to [facility] had already loaded this pt on stretcher and was wheeling her through the other set of doors in [facility].  I asked Gunnar Fusi from [facility] who was collecting paperwork at the counter if she had signed her EMTALA form for another nurse and she said no she didn't.  I asked Gunnar Fusi to bring the patient back in order to sign EMTALA and put up to date vitals on it [place] from [facility]  went down the hall and then came back and said Asher Muir the RN on the truck refused to bring the patient back and said that it was unnecessary.  Charge nurse notified.  Safety Zone completed.  This nurse was told [facility] would arrive in 1.5 hours from 2300, unaware at the time another [facility] team that was already upstairs was coming down to transport her.

## 2015-04-21 NOTE — H&P (Signed)
[facility] Faculty Practice OB/GYN Attending History and Physical Note   Reason for Admission Deanna Sullivan is a 19 y.o. G1P1001 being admitted for retained products of conception and bleeding after recent SVD on 04/15/15.  Patient also has physical exam and ultrasound evidence of postpartum endometritis.   History of Present Illness Patient underwent IOL for IUGR at [redacted]w[redacted]d; had significant intrapartum bleeding concerning for placental abruption.  She progressed rapidly in labor and had vaginal delivery; this was complicated by PPH treated with uterotonics.  Her hemoglobin dropped to 5.7 and she received transfusion of 2 units of pRBCs which increased her hemoglobin to 8.4. Patient was discharged to home on PPD#2.  On 04/21/15, she presented to [facility] ER with report of persistent heavy vaginal bleeding, progressively worsening low abdominal/pelvic pain and fever of 102 F at home. Also reported fatigue, and lightheadedness.  Denied any nausea, vomiting, GI or GU complaints.  On exam, she was noted to be afebrile with stable vital signs.  On pelvic exam, Dr. Verdie Mosher (ED physician) reported purulent, malodorous discharge from uterus, no active bleeding.  Ultrasound was done that showed 6 cm heterogenous solid collection within uterus concerning for retained products of conception (RPOC) and evidence of endometritis. WBC 6.6. Our service was consulted for further management; I recommended initiation of Zosyn therapy and transfer to [facility] for D&E.  Patient reported that she ate 2 hours prior to consultation time.  On arrival at [facility], patient reports continued but decreased lower abdominal pain after she received pain medications at the ER.  Denies any other current symptoms. Reports markedly decreased bleeding, just has malodorous lochia.  Proposed surgery: Dilation and Evacution  Past Medical History  Diagnosis Date  . Anemia    History reviewed. No pertinent past surgical  history. OB History  Gravida Para Term Preterm AB SAB TAB Ectopic Multiple Living  1 1 1       0 1    # Outcome Date GA Lbr Len/2nd Weight Sex Delivery Anes PTL Lv  1 Term 04/15/15 [redacted]w[redacted]d 01:10 / 00:15 6 lb 5.2 oz (2.87 kg) M Vag-Spont Local  Y    Patient denies any other pertinent gynecologic issues.   No current facility-administered medications on file prior to encounter.   Current Outpatient Prescriptions on File Prior to Encounter  Medication Sig Dispense Refill  . acetaminophen (TYLENOL) 500 MG tablet Take 1,000 mg by mouth every 6 (six) hours as needed for moderate pain or headache.     . ferrous sulfate (FERROUSUL) 325 (65 FE) MG tablet Take 1 tablet (325 mg total) by mouth 2 (two) times daily. 60 tablet 1  . ibuprofen (ADVIL,MOTRIN) 600 MG tablet Take 1 tablet (600 mg total) by mouth every 6 (six) hours. 30 tablet 0  . Prenatal Vit-Fe Fumarate-FA (PRENATAL COMPLETE) 14-0.4 MG TABS Take as directed (Patient not taking: Reported on 04/21/2015) 60 each 0   No Known Allergies  Social History:   reports that she has quit smoking. Her smoking use included Cigars. She has never used smokeless tobacco. She reports that she does not drink alcohol or use illicit drugs.  Family History  Problem Relation Age of Onset  . Cancer Mother     Review of Systems: Noncontributory  PHYSICAL EXAM: Blood pressure 138/95, pulse 84, temperature 98.4 F (36.9 C), temperature source Oral, resp. rate 15, height 5\' 4"  (1.626 m), weight 127 lb 11.2 oz (57.924 kg), SpO2 100 %, unknown if  currently breastfeeding.   Patient Vitals for the past 24 hrs:  BP Temp Temp src Pulse Resp SpO2 Height Weight  04/21/15 2315 138/95 mmHg - - 84 - 100 % - -  04/21/15 2300 129/75 mmHg - - 69 - 100 % - -  04/21/15 2230 135/86 mmHg - - 74 - 99 % - -  04/21/15 2216 (!) 107/51 mmHg - - (!) 54 15 99 % - -  04/21/15 2200 123/83 mmHg - - 71 - 100 % - -  04/21/15 2142 129/88 mmHg - - 68 12 100 % - -  04/21/15 2100 124/78  mmHg - - - - - - -  04/21/15 2059 - - - 75 - 100 % - -  04/21/15 2046 134/81 mmHg - - 64 - 100 % - -  04/21/15 2000 128/80 mmHg - - 71 - 100 % - -  04/21/15 1931 132/81 mmHg - - 80 12 100 % - -  04/21/15 1930 132/81 mmHg - - 83 - 100 % - -  04/21/15 1911 129/79 mmHg - - 76 - 100 % - -  04/21/15 1824 124/80 mmHg 98.4 F (36.9 C) Oral 94 16 99 % 5\' 4"  (1.626 m) 127 lb 11.2 oz (57.924 kg)   CONSTITUTIONAL: Well-developed, well-nourished female in no acute distress.  HENT:  Normocephalic, atraumatic, External right and left ear normal. Oropharynx is clear and moist EYES: Conjunctivae and EOM are normal. Pupils are equal, round, and reactive to light. No scleral icterus.  NECK: Normal range of motion, supple, no masses SKIN: Skin is warm and dry. No rash noted. Not diaphoretic. No erythema. No pallor. NEUROLGIC: Alert and oriented to person, place, and time. Normal reflexes, muscle tone coordination. No cranial nerve deficit noted. PSYCHIATRIC: Normal mood and affect. Normal behavior. Normal judgment and thought content. CARDIOVASCULAR: Normal heart rate noted, regular rhythm RESPIRATORY: Effort and breath sounds normal, no problems with respiration noted ABDOMEN: Soft, nondistended, moderately tender, no rebound, no guarding. PELVIC: Red-brown malodorous lochia noted on pad, no active bleeding MUSCULOSKELETAL: Normal range of motion. No edema and no tenderness. 2+ distal pulses.  Labs: Results for orders placed or performed during the hospital encounter of 04/21/15 (from the past 336 hour(s))  Type and screen   Collection Time: 04/21/15  6:34 PM  Result Value Ref Range   ABO/RH(D) O POS    Antibody Screen NEG    Sample Expiration 04/24/2015   ABO/Rh   Collection Time: 04/21/15  6:34 PM  Result Value Ref Range   ABO/RH(D) O POS   Comprehensive metabolic panel   Collection Time: 04/21/15  7:12 PM  Result Value Ref Range   Sodium 136 135 - 145 mmol/L   Potassium 3.1 (L) 3.5 - 5.1  mmol/L   Chloride 105 101 - 111 mmol/L   CO2 23 22 - 32 mmol/L   Glucose, Bld 89 65 - 99 mg/dL   BUN 9 6 - 20 mg/dL   Creatinine, Ser 1.09 0.44 - 1.00 mg/dL   Calcium 8.2 (L) 8.9 - 10.3 mg/dL   Total Protein 6.8 6.5 - 8.1 g/dL   Albumin 2.5 (L) 3.5 - 5.0 g/dL   AST 28 15 - 41 U/L   ALT 24 14 - 54 U/L   Alkaline Phosphatase 135 (H) 38 - 126 U/L   Total Bilirubin 0.3 0.3 - 1.2 mg/dL   GFR calc non Af Amer >60 >60 mL/min   GFR calc Af Amer >60 >60 mL/min   Anion gap  8 5 - 15  CBC   Collection Time: 04/21/15  7:12 PM  Result Value Ref Range   WBC 6.6 4.0 - 10.5 K/uL   RBC 3.29 (L) 3.87 - 5.11 MIL/uL   Hemoglobin 8.7 (L) 12.0 - 15.0 g/dL   HCT 53.6 (L) 64.4 - 03.4 %   MCV 82.4 78.0 - 100.0 fL   MCH 26.4 26.0 - 34.0 pg   MCHC 32.1 30.0 - 36.0 g/dL   RDW 74.2 59.5 - 63.8 %   Platelets 398 150 - 400 K/uL  Lipase, blood   Collection Time: 04/21/15  7:21 PM  Result Value Ref Range   Lipase 22 22 - 51 U/L  Protime-INR   Collection Time: 04/21/15  7:21 PM  Result Value Ref Range   Prothrombin Time 15.7 (H) 11.6 - 15.2 seconds   INR 1.23 0.00 - 1.49  APTT   Collection Time: 04/21/15  7:21 PM  Result Value Ref Range   aPTT 29 24 - 37 seconds  Urinalysis with microscopic   Collection Time: 04/21/15  8:45 PM  Result Value Ref Range   Color, Urine STRAW (A) YELLOW   APPearance CLOUDY (A) CLEAR   Specific Gravity, Urine 1.008 1.005 - 1.030   pH 6.5 5.0 - 8.0   Glucose, UA NEGATIVE NEGATIVE mg/dL   Hgb urine dipstick MODERATE (A) NEGATIVE   Bilirubin Urine NEGATIVE NEGATIVE   Ketones, ur NEGATIVE NEGATIVE mg/dL   Protein, ur NEGATIVE NEGATIVE mg/dL   Urobilinogen, UA 1.0 0.0 - 1.0 mg/dL   Nitrite NEGATIVE NEGATIVE   Leukocytes, UA LARGE (A) NEGATIVE   WBC, UA 3-6 <3 WBC/hpf   RBC / HPF 3-6 <3 RBC/hpf   Bacteria, UA MANY (A) RARE   Squamous Epithelial / LPF FEW (A) RARE  Wet prep, genital   Collection Time: 04/21/15  9:56 PM  Result Value Ref Range   Yeast Wet Prep HPF POC  NONE SEEN NONE SEEN   Trich, Wet Prep NONE SEEN NONE SEEN   Clue Cells Wet Prep HPF POC FEW (A) NONE SEEN   WBC, Wet Prep HPF POC TOO NUMEROUS TO COUNT (A) NONE SEEN  Results for orders placed or performed during the hospital encounter of 04/14/15 (from the past 336 hour(s))  OB RESULT CONSOLE Group B Strep   Collection Time: 04/14/15 12:00 AM  Result Value Ref Range   GBS Positive   CBC   Collection Time: 04/14/15  8:15 PM  Result Value Ref Range   WBC 6.4 4.0 - 10.5 K/uL   RBC 2.85 (L) 3.87 - 5.11 MIL/uL   Hemoglobin 7.2 (L) 12.0 - 15.0 g/dL   HCT 75.6 (L) 43.3 - 29.5 %   MCV 80.4 78.0 - 100.0 fL   MCH 25.3 (L) 26.0 - 34.0 pg   MCHC 31.4 30.0 - 36.0 g/dL   RDW 18.8 41.6 - 60.6 %   Platelets 227 150 - 400 K/uL  RPR   Collection Time: 04/14/15  8:15 PM  Result Value Ref Range   RPR Ser Ql Non Reactive Non Reactive  Type and screen   Collection Time: 04/14/15  8:15 PM  Result Value Ref Range   ABO/RH(D) O POS    Antibody Screen NEG    Sample Expiration 04/17/2015    Unit Number [id number]    Blood Component Type RBC LR PHER2    Unit division 00    Status of Unit ISSUED,FINAL    Transfusion Status OK TO TRANSFUSE  Crossmatch Result Compatible    Unit Number [id number]    Blood Component Type RBC LR PHER1    Unit division 00    Status of Unit ISSUED,FINAL    Transfusion Status OK TO TRANSFUSE    Crossmatch Result Compatible   ABO/Rh   Collection Time: 04/14/15  8:15 PM  Result Value Ref Range   ABO/RH(D) O POS   Group B strep by PCR   Collection Time: 04/14/15  9:25 PM  Result Value Ref Range   Group B strep by PCR POSITIVE (A) NEGATIVE  Prepare RBC   Collection Time: 04/15/15  4:30 AM  Result Value Ref Range   Order Confirmation ORDER PROCESSED BY BLOOD BANK   CBC   Collection Time: 04/15/15  1:14 PM  Result Value Ref Range   WBC 10.6 (H) 4.0 - 10.5 K/uL   RBC 2.27 (L) 3.87 - 5.11 MIL/uL   Hemoglobin 5.7 (LL) 12.0 - 15.0 g/dL   HCT 11.9 (L) 14.7 -  46.0 %   MCV 79.3 78.0 - 100.0 fL   MCH 25.1 (L) 26.0 - 34.0 pg   MCHC 31.7 30.0 - 36.0 g/dL   RDW 82.9 56.2 - 13.0 %   Platelets 187 150 - 400 K/uL  CBC   Collection Time: 04/16/15  6:32 AM  Result Value Ref Range   WBC 19.6 (H) 4.0 - 10.5 K/uL   RBC 3.09 (L) 3.87 - 5.11 MIL/uL   Hemoglobin 8.4 (L) 12.0 - 15.0 g/dL   HCT 86.5 (L) 78.4 - 69.6 %   MCV 81.6 78.0 - 100.0 fL   MCH 27.2 26.0 - 34.0 pg   MCHC 33.3 30.0 - 36.0 g/dL   RDW 29.5 28.4 - 13.2 %   Platelets 212 150 - 400 K/uL  Results for orders placed or performed in visit on 04/14/15 (from the past 336 hour(s))  POCT urinalysis dip (device)   Collection Time: 04/14/15  3:21 PM  Result Value Ref Range   Glucose, UA NEGATIVE NEGATIVE mg/dL   Bilirubin Urine NEGATIVE NEGATIVE   Ketones, ur NEGATIVE NEGATIVE mg/dL   Specific Gravity, Urine <=1.005 1.005 - 1.030   Hgb urine dipstick NEGATIVE NEGATIVE   pH 5.5 5.0 - 8.0   Protein, ur NEGATIVE NEGATIVE mg/dL   Urobilinogen, UA 0.2 0.0 - 1.0 mg/dL   Nitrite NEGATIVE NEGATIVE   Leukocytes, UA NEGATIVE NEGATIVE    Imaging Studies: US Pelvis Complete  04/21/2015   CLINICAL DATA:  19 year old female 1 week postpartum presenting with vaginal bleeding.  EXAM: LIMITED ULTRASOUND OF PELVIS  TECHNIQUE: Limited transabdominal ultrasound examination of the pelvis was performed.  COMPARISON:  Obstetrical ultrasound dated 03/07/2015  FINDINGS: The uterus is enlarged and heterogeneous measuring 15 x 8 x 9 cm. Doppler images demonstrate diffuse hyperemia of the endometrium and surrounding myometrial tissue. An ill-defined heterogeneous predominantly solid collection may be present within the uterus measuring approximately 6 cm in thickness and is concerning for retained products of conception. Echogenic material noted within the lower uterine segment measuring approximately 2.2 cm in thickness likely representing blood clot.  The right ovary measures 2.2 x 1.3 x 1.8 cm and the left ovary measures  3.1 x 1.9 x 3.2 cm. The ovaries are grossly unremarkable.  IMPRESSION: Enlarged and hyperemic uterus with possible hyperemic collection. Findings may represent endometritis but are concerning for retained products of conception. Clinical correlation and obstetrical consult is advised.  These results were called by telephone at the time of interpretation on 04/21/2015  at 9:45 pm to Dr. Crista Curb , who verbally acknowledged these results.   Electronically Signed   By: Elgie Collard M.D.   On: 04/21/2015 21:45    Assessment: Patient Active Problem List   Diagnosis Date Noted  . Retained placenta with hemorrhage, postpartum condition 04/21/2015  . Postpartum endometritis 04/21/2015  . Retained products of conception with hemorrhage   . Nexplanon in place; placed postpartum in hospital on 04/16/15 04/16/2015  . NSVD (normal spontaneous vaginal delivery) on 04/15/2015 04/15/2015    Plan: Admit to Women's Unit Continue endometritis treatment with Zosyn Patient will undergo surgical management with D&E for RPOC; this will be done in the morning after a few doses of antibiotics. The surgeon will be Dr. Scheryl Darter (Attending on call 04/22/15 for Faculty Practice); he was called and notified of this surgery booking.  Risks of surgery including bleeding, infection, injury to surrounding organs, need for additional procedures, possibility of intrauterine scarring which may impair future fertility, risk of retained products which may require further management and other postoperative/anesthesia complications were explained to patient.  Likelihood of success of complete evacuation of the uterus was discussed with the patient.  Routine postoperative instructions will be reviewed with the patient and her family in detail after surgery.  The patient concurred with the proposed plan, giving informed written consent for the surgery.  Patient has been NPO since 2000 on 04/21/15;  she will remain NPO for procedure.   Anesthesiology team and OR aware.  Patient understands that if her bleeding increases/becomes worrisome or if she has any other concerning symptoms overnight, the D&E may have to be performed urgently.  OR and Anesthesiology team is also aware of this possibility Will continue close inpatient observation.   Jaynie Collins, M.D. 04/22/2015 12:16 AM

## 2015-04-21 NOTE — ED Notes (Addendum)
Pt from home for eval of fatigue and ongoing heavy vaginal bleeding, pt is 1 month post partum and states she had to receive 3 bags of blood after delivering her baby. Pt states has been having heavy bleeding and passing clots since. Mother of pt states pt so weak she can hardly walk and has almost dropped baby due to weakness. Pt in nad in triage. Pt reports bilateral back pain. Mother states she gave pt motrin and flexeril pill with no relief.

## 2015-04-22 ENCOUNTER — Encounter (HOSPITAL_COMMUNITY)
Admission: EM | Disposition: A | Discharge status: Home / Self Care | Payer: Self-pay | Source: Home / Self Care | Attending: Obstetrics & Gynecology

## 2015-04-22 ENCOUNTER — Inpatient Hospital Stay (HOSPITAL_COMMUNITY): Payer: Medicaid Other | Admitting: Anesthesiology

## 2015-04-22 ENCOUNTER — Encounter (HOSPITAL_COMMUNITY): Payer: Self-pay | Admitting: Anesthesiology

## 2015-04-22 DIAGNOSIS — O8612 Endometritis following delivery: Secondary | ICD-10-CM

## 2015-04-22 DIAGNOSIS — N710 Acute inflammatory disease of uterus: Secondary | ICD-10-CM

## 2015-04-22 HISTORY — PX: DILATION AND EVACUATION: SHX1459

## 2015-04-22 LAB — CBC
HEMATOCRIT: 26.6 % — AB (ref 36.0–46.0)
Hemoglobin: 8.6 g/dL — ABNORMAL LOW (ref 12.0–15.0)
MCH: 26.7 pg (ref 26.0–34.0)
MCHC: 32.3 g/dL (ref 30.0–36.0)
MCV: 82.6 fL (ref 78.0–100.0)
Platelets: 348 10*3/uL (ref 150–400)
RBC: 3.22 MIL/uL — ABNORMAL LOW (ref 3.87–5.11)
RDW: 15.5 % (ref 11.5–15.5)
WBC: 6.8 10*3/uL (ref 4.0–10.5)

## 2015-04-22 LAB — TYPE AND SCREEN
ABO/RH(D): O POS
Antibody Screen: NEGATIVE

## 2015-04-22 LAB — SURGICAL PCR SCREEN
MRSA, PCR: NEGATIVE
Staphylococcus aureus: POSITIVE — AB

## 2015-04-22 LAB — GC/CHLAMYDIA PROBE AMP (~~LOC~~) NOT AT ARMC
Chlamydia: NEGATIVE
Neisseria Gonorrhea: NEGATIVE

## 2015-04-22 LAB — ABO/RH: ABO/RH(D): O POS

## 2015-04-22 SURGERY — DILATION AND EVACUATION, UTERUS
Anesthesia: Monitor Anesthesia Care

## 2015-04-22 MED ORDER — FENTANYL CITRATE (PF) 100 MCG/2ML IJ SOLN: 25.0000 ug | INTRAMUSCULAR | Status: DC | PRN

## 2015-04-22 MED ORDER — PROPOFOL 10 MG/ML IV BOLUS
INTRAVENOUS | Status: AC
  Filled 2015-04-22: qty 20

## 2015-04-22 MED ORDER — METOCLOPRAMIDE HCL 5 MG/ML IJ SOLN: 10.0000 mg | Freq: Once | INTRAMUSCULAR | Status: DC | PRN

## 2015-04-22 MED ORDER — ONDANSETRON HCL 4 MG/2ML IJ SOLN
INTRAMUSCULAR | Status: DC | PRN
  Administered 2015-04-22: 4 mg via INTRAVENOUS

## 2015-04-22 MED ORDER — ONDANSETRON HCL 4 MG/2ML IJ SOLN: 4.0000 mg | Freq: Four times a day (QID) | INTRAMUSCULAR | Status: DC | PRN

## 2015-04-22 MED ORDER — OXYCODONE-ACETAMINOPHEN 5-325 MG PO TABS
1.0000 | ORAL_TABLET | ORAL | Status: DC | PRN
  Administered 2015-04-22 – 2015-04-23 (×4): 2 via ORAL
  Filled 2015-04-22 (×2): qty 2

## 2015-04-22 MED ORDER — CHLORHEXIDINE GLUCONATE CLOTH 2 % EX PADS
6.0000 | MEDICATED_PAD | Freq: Every day | CUTANEOUS | Status: DC
  Administered 2015-04-22: 6 via TOPICAL

## 2015-04-22 MED ORDER — PIPERACILLIN-TAZOBACTAM 3.375 G IVPB
3.3750 g | Freq: Three times a day (TID) | INTRAVENOUS | Status: DC
  Administered 2015-04-22 – 2015-04-23 (×5): 3.375 g via INTRAVENOUS
  Filled 2015-04-22 (×6): qty 50

## 2015-04-22 MED ORDER — ONDANSETRON HCL 4 MG PO TABS
4.0000 mg | ORAL_TABLET | Freq: Four times a day (QID) | ORAL | Status: DC | PRN
Start: 2015-04-22 — End: 2015-04-23

## 2015-04-22 MED ORDER — MUPIROCIN 2 % EX OINT
1.0000 "application " | TOPICAL_OINTMENT | Freq: Two times a day (BID) | CUTANEOUS | Status: DC
  Administered 2015-04-22 (×2): 1 via NASAL
  Filled 2015-04-22: qty 22

## 2015-04-22 MED ORDER — MIDAZOLAM HCL 2 MG/2ML IJ SOLN
INTRAMUSCULAR | Status: DC | PRN
  Administered 2015-04-22: 2 mg via INTRAVENOUS

## 2015-04-22 MED ORDER — MIDAZOLAM HCL 2 MG/2ML IJ SOLN
INTRAMUSCULAR | Status: AC
  Filled 2015-04-22: qty 4

## 2015-04-22 MED ORDER — FENTANYL CITRATE (PF) 100 MCG/2ML IJ SOLN
INTRAMUSCULAR | Status: DC | PRN
  Administered 2015-04-22: 100 ug via INTRAVENOUS

## 2015-04-22 MED ORDER — BUPIVACAINE-EPINEPHRINE 0.5% -1:200000 IJ SOLN
INTRAMUSCULAR | Status: DC | PRN
  Administered 2015-04-22: 10 mL

## 2015-04-22 MED ORDER — OXYTOCIN 10 UNIT/ML IJ SOLN
INTRAMUSCULAR | Status: AC
  Filled 2015-04-22: qty 4

## 2015-04-22 MED ORDER — KETOROLAC TROMETHAMINE 30 MG/ML IJ SOLN
INTRAMUSCULAR | Status: DC | PRN
  Administered 2015-04-22: 30 mg via INTRAVENOUS

## 2015-04-22 MED ORDER — MEPERIDINE HCL 25 MG/ML IJ SOLN: 6.2500 mg | INTRAMUSCULAR | Status: DC | PRN

## 2015-04-22 MED ORDER — LIDOCAINE HCL (CARDIAC) 20 MG/ML IV SOLN
INTRAVENOUS | Status: AC
  Filled 2015-04-22: qty 5

## 2015-04-22 MED ORDER — BUPIVACAINE-EPINEPHRINE (PF) 0.5% -1:200000 IJ SOLN
INTRAMUSCULAR | Status: AC
  Filled 2015-04-22: qty 30

## 2015-04-22 MED ORDER — DEXAMETHASONE SODIUM PHOSPHATE 4 MG/ML IJ SOLN
INTRAMUSCULAR | Status: AC
  Filled 2015-04-22: qty 1

## 2015-04-22 MED ORDER — KETOROLAC TROMETHAMINE 30 MG/ML IJ SOLN
INTRAMUSCULAR | Status: AC
  Filled 2015-04-22: qty 1

## 2015-04-22 MED ORDER — DEXAMETHASONE SODIUM PHOSPHATE 10 MG/ML IJ SOLN
INTRAMUSCULAR | Status: DC | PRN
  Administered 2015-04-22: 4 mg via INTRAVENOUS

## 2015-04-22 MED ORDER — PROPOFOL 10 MG/ML IV BOLUS
INTRAVENOUS | Status: DC | PRN
  Administered 2015-04-22 (×4): 20 mg via INTRAVENOUS
  Administered 2015-04-22: 50 mg via INTRAVENOUS
  Administered 2015-04-22: 20 mg via INTRAVENOUS

## 2015-04-22 MED ORDER — LIDOCAINE HCL (CARDIAC) 20 MG/ML IV SOLN
INTRAVENOUS | Status: DC | PRN
  Administered 2015-04-22: 80 mg via INTRAVENOUS

## 2015-04-22 MED ORDER — ONDANSETRON HCL 4 MG/2ML IJ SOLN
INTRAMUSCULAR | Status: AC
  Filled 2015-04-22: qty 2

## 2015-04-22 MED ORDER — OXYTOCIN 10 UNIT/ML IJ SOLN
40.0000 [IU] | INTRAVENOUS | Status: DC | PRN
  Administered 2015-04-22: 40 [IU] via INTRAVENOUS

## 2015-04-22 MED ORDER — FENTANYL CITRATE (PF) 250 MCG/5ML IJ SOLN
INTRAMUSCULAR | Status: AC
  Filled 2015-04-22: qty 25

## 2015-04-22 SURGICAL SUPPLY — 20 items
CATH ROBINSON RED A/P 16FR (CATHETERS) ×3 IMPLANT
CLOTH BEACON ORANGE TIMEOUT ST (SAFETY) ×3 IMPLANT
DECANTER SPIKE VIAL GLASS SM (MISCELLANEOUS) IMPLANT
GLOVE BIO SURGEON STRL SZ 6.5 (GLOVE) ×2 IMPLANT
GLOVE BIO SURGEONS STRL SZ 6.5 (GLOVE) ×1
GLOVE BIOGEL PI IND STRL 7.0 (GLOVE) ×1 IMPLANT
GLOVE BIOGEL PI INDICATOR 7.0 (GLOVE) ×2
GOWN STRL REUS W/TWL LRG LVL3 (GOWN DISPOSABLE) ×6 IMPLANT
KIT BERKELEY 1ST TRIMESTER 3/8 (MISCELLANEOUS) IMPLANT
NS IRRIG 1000ML POUR BTL (IV SOLUTION) ×3 IMPLANT
PACK VAGINAL MINOR WOMEN LF (CUSTOM PROCEDURE TRAY) ×3 IMPLANT
PAD OB MATERNITY 4.3X12.25 (PERSONAL CARE ITEMS) ×3 IMPLANT
PAD PREP 24X48 CUFFED NSTRL (MISCELLANEOUS) ×3 IMPLANT
SET BERKELEY SUCTION TUBING (SUCTIONS) IMPLANT
TOWEL OR 17X24 6PK STRL BLUE (TOWEL DISPOSABLE) ×6 IMPLANT
VACURETTE 10 RIGID CVD (CANNULA) IMPLANT
VACURETTE 12 RIGID CVD (CANNULA) ×3 IMPLANT
VACURETTE 7MM CVD STRL WRAP (CANNULA) IMPLANT
VACURETTE 8 RIGID CVD (CANNULA) IMPLANT
VACURETTE 9 RIGID CVD (CANNULA) IMPLANT

## 2015-04-22 NOTE — Transfer of Care (Signed)
Immediate Anesthesia Transfer of Care Note  Patient: Deanna Sullivan  Procedure(s) Performed: Procedure(s): DILATATION AND EVACUATION (N/A)  Patient Location: PACU  Anesthesia Type:MAC  Level of Consciousness: sedated  Airway & Oxygen Therapy: Patient Spontanous Breathing and Patient connected to nasal cannula oxygen  Post-op Assessment: Report given to RN  Post vital signs: Reviewed  Last Vitals:  Filed Vitals:   04/22/15 1000  BP: 129/82  Pulse: 70  Temp: 36.8 C  Resp: 18    Complications: No apparent anesthesia complications

## 2015-04-22 NOTE — Progress Notes (Signed)
Faculty Practice OB/GYN Attending Note  Subjective:  No sentinel events overnight.  Reports minimal pain and bleeding this morning.  Admitted on 04/21/2015 for Retained placenta with hemorrhage, postpartum condition and endometritis.   Objective:  Blood pressure 142/80, pulse 64, temperature 98.3 F (36.8 C), temperature source Oral, resp. rate 18, height 5\' 4"  (1.626 m), weight 127 lb 11.2 oz (57.924 kg), SpO2 100 %, unknown if currently breastfeeding. Gen: NAD HENT: Normocephalic, atraumatic Lungs: Normal respiratory effort Heart: Regular rate noted Abdomen: Mild tenderness to palpation, no rebound or guarding Cervix: Deferred Ext: 2+ DTRs, no edema, no cyanosis, negative Homan's sign  Studies: CBC Latest Ref Rng 04/22/2015 04/21/2015 04/16/2015  WBC 4.0 - 10.5 K/uL 6.8 6.6 19.6(H)  Hemoglobin 12.0 - 15.0 g/dL 8.0(S) 8.1(J) 0.3(P)  Hematocrit 36.0 - 46.0 % 26.6(L) 27.1(L) 25.2(L)  Platelets 150 - 400 K/uL 348 398 212   Surgical pcr screen     Status: Abnormal   Collection Time: 04/22/15 12:32 AM  Result Value Ref Range   MRSA, PCR NEGATIVE NEGATIVE   Staphylococcus aureus POSITIVE (A) - treated with Bactroban NEGATIVE  Type and screen     Status: None   Collection Time: 04/22/15  5:30 AM  Result Value Ref Range   ABO/RH(D) O POS    Antibody Screen NEG    Sample Expiration 04/25/2015     04/21/2015 CLINICAL DATA: 19 year old female 1 week postpartum presenting with vaginal bleeding. EXAM: LIMITED ULTRASOUND OF PELVIS TECHNIQUE: Limited transabdominal ultrasound examination of the pelvis was performed. COMPARISON: Obstetrical ultrasound dated 03/07/2015 FINDINGS: The uterus is enlarged and heterogeneous measuring 15 x 8 x 9 cm. Doppler images demonstrate diffuse hyperemia of the endometrium and surrounding myometrial tissue. An ill-defined heterogeneous predominantly solid collection may be present within the uterus measuring approximately 6 cm in thickness and is concerning for  retained products of conception. Echogenic material noted within the lower uterine segment measuring approximately 2.2 cm in thickness likely representing blood clot. The right ovary measures 2.2 x 1.3 x 1.8 cm and the left ovary measures 3.1 x 1.9 x 3.2 cm. The ovaries are grossly unremarkable. IMPRESSION: Enlarged and hyperemic uterus with possible hyperemic collection. Findings may represent endometritis but are concerning for retained products of conception. Clinical correlation and obstetrical consult is advised. These results were called by telephone at the time of interpretation on 04/21/2015 at 9:45 pm to Dr. Crista Curb , who verbally acknowledged these results. Electronically Signed By: Elgie Collard M.D. On: 04/21/2015 21:45    Assessment & Plan:  19 y.o. G1P1001 admitted for retained products of conception and bleeding after recent SVD on 04/15/15. Patient also has postpartum endometritis.  Continue endometritis treatment with Zosyn and pain medication as needed. D&E scheduled for today with Dr. Debroah Loop; patient will remain NPO for procedure. Anesthesiology team and OR aware. To OR when ready.   Jaynie Collins, MD, FACOG Attending Obstetrician & Gynecologist Faculty Practice, [facility]

## 2015-04-22 NOTE — Anesthesia Preprocedure Evaluation (Signed)
Anesthesia Evaluation  Patient identified by MRN, date of birth, ID band Patient awake    Reviewed: Allergy & Precautions, NPO status , Patient's Chart, lab work & pertinent test results  Airway Mallampati: II  TM Distance: >3 FB Neck ROM: Full    Dental no notable dental hx. (+) Teeth Intact   Pulmonary former smoker,  breath sounds clear to auscultation  Pulmonary exam normal       Cardiovascular negative cardio ROS Normal cardiovascular examRhythm:Regular Rate:Normal     Neuro/Psych negative neurological ROS  negative psych ROS   GI/Hepatic negative GI ROS, Neg liver ROS,   Endo/Other  negative endocrine ROS  Renal/GU negative Renal ROS  negative genitourinary   Musculoskeletal negative musculoskeletal ROS (+)   Abdominal (+) - obese,   Peds  Hematology  (+) anemia ,   Anesthesia Other Findings   Reproductive/Obstetrics Retained POC- delivered 6 days ago                             Anesthesia Physical Anesthesia Plan  ASA: II  Anesthesia Plan: MAC   Post-op Pain Management:    Induction: Intravenous  Airway Management Planned: Mask  Additional Equipment:   Intra-op Plan:   Post-operative Plan:   Informed Consent: I have reviewed the patients History and Physical, chart, labs and discussed the procedure including the risks, benefits and alternatives for the proposed anesthesia with the patient or authorized representative who has indicated his/her understanding and acceptance.     Plan Discussed with: CRNA, Anesthesiologist and Surgeon  Anesthesia Plan Comments:         Anesthesia Quick Evaluation

## 2015-04-22 NOTE — Anesthesia Postprocedure Evaluation (Signed)
  Anesthesia Post-op Note  Patient: Deanna Sullivan  Procedure(s) Performed: Procedure(s): DILATATION AND EVACUATION (N/A)  Patient Location: PACU  Anesthesia Type:MAC  Level of Consciousness: awake, alert  and oriented  Airway and Oxygen Therapy: Patient Spontanous Breathing  Post-op Pain: none  Post-op Assessment: Post-op Vital signs reviewed, Patient's Cardiovascular Status Stable, Respiratory Function Stable, Patent Airway, No signs of Nausea or vomiting and Pain level controlled              Post-op Vital Signs: Reviewed and stable  Last Vitals:  Filed Vitals:   04/22/15 1300  BP:   Pulse:   Temp: 36.8 C  Resp:     Complications: No apparent anesthesia complications

## 2015-04-22 NOTE — Progress Notes (Signed)
Patient swabbed with PCR surgical swab, Staph result positive, MRSA negative. Staph PCR positive standing orders implemented. No contact isolation orders necessary at this time according to these standing orders. Will continue to monitor

## 2015-04-22 NOTE — Op Note (Signed)
[facility] PROCEDURE DATE: 04/22/2015  PREOPERATIVE DIAGNOSIS: endometritis and retained products of conception 7 days postpartum POSTOPERATIVE DIAGNOSIS: The same PROCEDURE:     Dilation and Evacuation SURGEON:  Adam Phenix, MD  INDICATIONS: 19 y.o. G1P1001 with retained products of conception 1 week postpartum, needing surgical evacuation.  Risks of surgery were discussed with the patient including but not limited to: bleeding which may require transfusion; infection which may require antibiotics; injury to uterus or surrounding organs; need for additional procedures including laparotomy or laparoscopy; possibility of intrauterine scarring which may impair future fertility; and other postoperative/anesthesia complications. Written informed consent was obtained.    FINDINGS:  A 8 week size uterus, moderate amounts of products of conception, specimen sent to pathology.  ANESTHESIA:    Monitored intravenous sedation, paracervical block. INTRAVENOUS FLUIDS:  1000 ml of LR ESTIMATED BLOOD LOSS:  Less than 20 ml. SPECIMENS:  Products of conception sent to pathology COMPLICATIONS:  None immediate.  PROCEDURE DETAILS:  The patient received Zosyn as treatment for endometritis.  She was then taken to the operating room where monitored intravenous sedation was administered and was found to be adequate.  After an adequate timeout was performed, she was placed in the dorsal lithotomy position and examined; then prepped and draped in the sterile manner.   Her bladder was catheterized for an unmeasured amount of clear, yellow urine. A vaginal speculum was then placed in the patient's vagina and a single tooth tenaculum was applied to the anterior lip of the cervix.  A paracervical block using 10 ml of 0.5% Marcaine was administered. The cervix was gently dilated to accommodate a 12 mm suction curette that was gently advanced to the uterine fundus.  The suction device was then activated and curette slowly  rotated to clear the uterus of products of conception. There was minimal bleeding noted and the tenaculum removed with good hemostasis noted.   All instruments were removed from the patient's vagina.  Sponge and instrument counts were correct times two  The patient tolerated the procedure well and was taken to the recovery area awake, and in stable condition.     Adam Phenix, MD  Attending Obstetrician & Gynecologist Faculty Practice, [facility]

## 2015-04-22 NOTE — ED Notes (Signed)
Wasted 0.49mL of dilaudid with Elliot Gurney, Charity fundraiser

## 2015-04-22 NOTE — Progress Notes (Deleted)
Patient swabbed with PCR surgical swab. Staph result positive, MRSA negative. Contact isolation implemented

## 2015-04-23 ENCOUNTER — Encounter (HOSPITAL_COMMUNITY): Payer: Self-pay | Admitting: Obstetrics & Gynecology

## 2015-04-23 MED ORDER — OXYCODONE-ACETAMINOPHEN 5-325 MG PO TABS: 1.0000 | ORAL_TABLET | ORAL | Status: DC | PRN

## 2015-04-23 NOTE — Discharge Instructions (Signed)
Dilation and Curettage or Vacuum Curettage, Care After  Refer to this sheet in the next few weeks. These instructions provide you with information on caring for yourself after your procedure. Your health care provider may also give you more specific instructions. Your treatment has been planned according to current medical practices, but problems sometimes occur. Call your health care provider if you have any problems or questions after your procedure.  WHAT TO EXPECT AFTER THE PROCEDURE  After your procedure, it is typical to have light cramping and bleeding. This may last for 2 days to 2 weeks after the procedure.  HOME CARE INSTRUCTIONS   · Do not drive for 24 hours.  · Wait 1 week before returning to strenuous activities.  · Take your temperature 2 times a day for 4 days and write it down. Provide these temperatures to your health care provider if you develop a fever.  · Avoid long periods of standing.  · Avoid heavy lifting, pushing, or pulling. Do not lift anything heavier than 10 pounds (4.5 kg).  · Limit stair climbing to once or twice a day.  · Take rest periods often.  · You may resume your usual diet.  · Drink enough fluids to keep your urine clear or pale yellow.  · Your usual bowel function should return. If you have constipation, you may:  ¨ Take a mild laxative with permission from your health care provider.  ¨ Add fruit and bran to your diet.  ¨ Drink more fluids.  · Take showers instead of baths until your health care provider gives you permission to take baths.  · Do not go swimming or use a hot tub until your health care provider approves.  · Try to have someone with you or available to you the first 24-48 hours, especially if you were given a general anesthetic.  · Do not douche, use tampons, or have sex (intercourse) for 2 weeks after the procedure.  · Only take over-the-counter or prescription medicines as directed by your health care provider. Do not take aspirin. It can cause  bleeding.  · Follow up with your health care provider as directed.  SEEK MEDICAL CARE IF:   · You have increasing cramps or pain that is not relieved with medicine.  · You have abdominal pain that does not seem to be related to the same area of earlier cramping and pain.  · You have bad smelling vaginal discharge.  · You have a rash.  · You are having problems with any medicine.  SEEK IMMEDIATE MEDICAL CARE IF:   · You have bleeding that is heavier than a normal menstrual period.  · You have a fever.  · You have chest pain.  · You have shortness of breath.  · You feel dizzy or feel like fainting.  · You pass out.  · You have pain in your shoulder strap area.  · You have heavy vaginal bleeding with or without blood clots.  MAKE SURE YOU:   · Understand these instructions.  · Will watch your condition.  · Will get help right away if you are not doing well or get worse.  Document Released: 07/30/2000 Document Revised: 08/07/2013 Document Reviewed: 03/01/2013  ExitCare® Patient Information ©2015 [employer]. This information is not intended to replace advice given to you by your health care provider. Make sure you discuss any questions you have with your health care provider.

## 2015-04-23 NOTE — Progress Notes (Signed)
Pt   teaching complete  Ambulate out to car

## 2015-04-23 NOTE — Discharge Summary (Signed)
Physician Discharge Summary  Patient ID: Deanna Sullivan MRN: [id number] DOB/AGE: 03-Jun-1996 19 y.o.  Admit date: 04/21/2015 Discharge date: 04/23/2015  Admission Diagnoses:Retained products of conception postpartum  Discharge Diagnoses:  Active Problems:   NSVD (normal spontaneous vaginal delivery) on 04/15/2015   Postpartum endometritis   Discharged Condition: good  Hospital Course:   Reason for Admission Deanna Sullivan is a 19 y.o. G1P1001 being admitted for retained products of conception and bleeding after recent SVD on 04/15/15. Patient also has physical exam and ultrasound evidence of postpartum endometritis.   History of Present Illness Patient underwent IOL for IUGR at [redacted]w[redacted]d; had significant intrapartum bleeding concerning for placental abruption. She progressed rapidly in labor and had vaginal delivery; this was complicated by PPH treated with uterotonics. Her hemoglobin dropped to 5.7 and she received transfusion of 2 units of pRBCs which increased her hemoglobin to 8.4. Patient was discharged to home on PPD#2.  On 04/21/15, she presented to [facility] ER with report of persistent heavy vaginal bleeding, progressively worsening low abdominal/pelvic pain and fever of 102 F at home. Also reported fatigue, and lightheadedness. Denied any nausea, vomiting, GI or GU complaints. On exam, she was noted to be afebrile with stable vital signs. On pelvic exam, Dr. Verdie Mosher (ED physician) reported purulent, malodorous discharge from uterus, no active bleeding. Ultrasound was done that showed 6 cm heterogenous solid collection within uterus concerning for retained products of conception (RPOC) and evidence of endometritis. WBC 6.6. Our service was consulted for further management; I recommended initiation of Zosyn therapy and transfer to [facility] for D&E. Patient reported that she ate 2 hours prior to consultation time.  On arrival at [facility], patient reports continued but decreased lower abdominal  pain after she received pain medications at the ER. Denies any other current symptoms. Reports markedly decreased bleeding, just has malodorous lochia.  Proposed surgery: Dilation and Evacution  Past Medical History  Diagnosis Date  . Anemia    History reviewed. No pertinent past surgical history. OB History  Gravida Para Term Preterm AB SAB TAB Ectopic Multiple Living  1 1 1       0 1    # Outcome Date GA Lbr Len/2nd Weight Sex Delivery Anes PTL Lv  1 Term 04/15/15 [redacted]w[redacted]d 01:10 / 00:15 6 lb 5.2 oz (2.87 kg) M Vag-Spont Local  Y    Patient denies any other pertinent gynecologic issues.   No current facility-administered medications on file prior to encounter.   Current Outpatient Prescriptions on File Prior to Encounter  Medication Sig Dispense Refill  . acetaminophen (TYLENOL) 500 MG tablet Take 1,000 mg by mouth every 6 (six) hours as needed for moderate pain or headache.     . ferrous sulfate (FERROUSUL) 325 (65 FE) MG tablet Take 1 tablet (325 mg total) by mouth 2 (two) times daily. 60 tablet 1  . ibuprofen (ADVIL,MOTRIN) 600 MG tablet Take 1 tablet (600 mg total) by mouth every 6 (six) hours. 30 tablet 0  . Prenatal Vit-Fe Fumarate-FA (PRENATAL COMPLETE) 14-0.4 MG TABS Take as directed (Patient not taking: Reported on 04/21/2015) 60 each 0   No Known Allergies  Social History:  reports that she has quit smoking. Her smoking use included Cigars. She has never used smokeless tobacco. She reports that she does not drink alcohol or use illicit drugs.  Family History  Problem Relation Age of Onset  . Cancer Mother     Review of Systems: Noncontributory  PHYSICAL EXAM: Blood pressure 138/95, pulse 84, temperature 98.4  F (36.9 C), temperature source Oral, resp. rate 15, height 5\' 4"  (1.626 m), weight 127 lb 11.2 oz (57.924 kg), SpO2 100 %, unknown if currently breastfeeding.   Patient  Vitals for the past 24 hrs:  BP Temp Temp src Pulse Resp SpO2 Height Weight  04/21/15 2315 138/95 mmHg - - 84 - 100 % - -  04/21/15 2300 129/75 mmHg - - 69 - 100 % - -  04/21/15 2230 135/86 mmHg - - 74 - 99 % - -  04/21/15 2216 (!) 107/51 mmHg - - (!) 54 15 99 % - -  04/21/15 2200 123/83 mmHg - - 71 - 100 % - -  04/21/15 2142 129/88 mmHg - - 68 12 100 % - -  04/21/15 2100 124/78 mmHg - - - - - - -  04/21/15 2059 - - - 75 - 100 % - -  04/21/15 2046 134/81 mmHg - - 64 - 100 % - -  04/21/15 2000 128/80 mmHg - - 71 - 100 % - -  04/21/15 1931 132/81 mmHg - - 80 12 100 % - -  04/21/15 1930 132/81 mmHg - - 83 - 100 % - -  04/21/15 1911 129/79 mmHg - - 76 - 100 % - -  04/21/15 1824 124/80 mmHg 98.4 F (36.9 C) Oral 94 16 99 % 5\' 4"  (1.626 m) 127 lb 11.2 oz (57.924 kg)   CONSTITUTIONAL: Well-developed, well-nourished female in no acute distress.  HENT: Normocephalic, atraumatic, External right and left ear normal. Oropharynx is clear and moist EYES: Conjunctivae and EOM are normal. Pupils are equal, round, and reactive to light. No scleral icterus.  NECK: Normal range of motion, supple, no masses SKIN: Skin is warm and dry. No rash noted. Not diaphoretic. No erythema. No pallor. NEUROLGIC: Alert and oriented to person, place, and time. Normal reflexes, muscle tone coordination. No cranial nerve deficit noted. PSYCHIATRIC: Normal mood and affect. Normal behavior. Normal judgment and thought content. CARDIOVASCULAR: Normal heart rate noted, regular rhythm RESPIRATORY: Effort and breath sounds normal, no problems with respiration noted ABDOMEN: Soft, nondistended, moderately tender, no rebound, no guarding. PELVIC: Red-brown malodorous lochia noted on pad, no active bleeding MUSCULOSKELETAL: Normal range of motion. No edema and no tenderness. 2+ distal  pulses.  Labs: Results for orders placed or performed during the hospital encounter of 04/21/15 (from the past 336 hour(s))  Type and screen   Collection Time: 04/21/15 6:34 PM  Result Value Ref Range   ABO/RH(D) O POS    Antibody Screen NEG    Sample Expiration 04/24/2015   ABO/Rh   Collection Time: 04/21/15 6:34 PM  Result Value Ref Range   ABO/RH(D) O POS   Comprehensive metabolic panel   Collection Time: 04/21/15 7:12 PM  Result Value Ref Range   Sodium 136 135 - 145 mmol/L   Potassium 3.1 (L) 3.5 - 5.1 mmol/L   Chloride 105 101 - 111 mmol/L   CO2 23 22 - 32 mmol/L   Glucose, Bld 89 65 - 99 mg/dL   BUN 9 6 - 20 mg/dL   Creatinine, Ser 3.49 0.44 - 1.00 mg/dL   Calcium 8.2 (L) 8.9 - 10.3 mg/dL   Total Protein 6.8 6.5 - 8.1 g/dL   Albumin 2.5 (L) 3.5 - 5.0 g/dL   AST 28 15 - 41 U/L   ALT 24 14 - 54 U/L   Alkaline Phosphatase 135 (H) 38 - 126 U/L   Total Bilirubin 0.3 0.3 - 1.2 mg/dL  GFR calc non Af Amer >60 >60 mL/min   GFR calc Af Amer >60 >60 mL/min   Anion gap 8 5 - 15  CBC   Collection Time: 04/21/15 7:12 PM  Result Value Ref Range   WBC 6.6 4.0 - 10.5 K/uL   RBC 3.29 (L) 3.87 - 5.11 MIL/uL   Hemoglobin 8.7 (L) 12.0 - 15.0 g/dL   HCT 16.1 (L) 09.6 - 04.5 %   MCV 82.4 78.0 - 100.0 fL   MCH 26.4 26.0 - 34.0 pg   MCHC 32.1 30.0 - 36.0 g/dL   RDW 40.9 81.1 - 91.4 %   Platelets 398 150 - 400 K/uL  Lipase, blood   Collection Time: 04/21/15 7:21 PM  Result Value Ref Range   Lipase 22 22 - 51 U/L  Protime-INR   Collection Time: 04/21/15 7:21 PM  Result Value Ref Range   Prothrombin Time 15.7 (H) 11.6 - 15.2 seconds   INR 1.23 0.00 - 1.49  APTT   Collection Time: 04/21/15 7:21 PM  Result Value Ref Range   aPTT 29 24 - 37 seconds  Urinalysis with microscopic   Collection  Time: 04/21/15 8:45 PM  Result Value Ref Range   Color, Urine STRAW (A) YELLOW   APPearance CLOUDY (A) CLEAR   Specific Gravity, Urine 1.008 1.005 - 1.030   pH 6.5 5.0 - 8.0   Glucose, UA NEGATIVE NEGATIVE mg/dL   Hgb urine dipstick MODERATE (A) NEGATIVE   Bilirubin Urine NEGATIVE NEGATIVE   Ketones, ur NEGATIVE NEGATIVE mg/dL   Protein, ur NEGATIVE NEGATIVE mg/dL   Urobilinogen, UA 1.0 0.0 - 1.0 mg/dL   Nitrite NEGATIVE NEGATIVE   Leukocytes, UA LARGE (A) NEGATIVE   WBC, UA 3-6 <3 WBC/hpf   RBC / HPF 3-6 <3 RBC/hpf   Bacteria, UA MANY (A) RARE   Squamous Epithelial / LPF FEW (A) RARE  Wet prep, genital   Collection Time: 04/21/15 9:56 PM  Result Value Ref Range   Yeast Wet Prep HPF POC NONE SEEN NONE SEEN   Trich, Wet Prep NONE SEEN NONE SEEN   Clue Cells Wet Prep HPF POC FEW (A) NONE SEEN   WBC, Wet Prep HPF POC TOO NUMEROUS TO COUNT (A) NONE SEEN  Results for orders placed or performed during the hospital encounter of 04/14/15 (from the past 336 hour(s))  OB RESULT CONSOLE Group B Strep   Collection Time: 04/14/15 12:00 AM  Result Value Ref Range   GBS Positive   CBC   Collection Time: 04/14/15 8:15 PM  Result Value Ref Range   WBC 6.4 4.0 - 10.5 K/uL   RBC 2.85 (L) 3.87 - 5.11 MIL/uL   Hemoglobin 7.2 (L) 12.0 - 15.0 g/dL   HCT 78.2 (L) 95.6 - 21.3 %   MCV 80.4 78.0 - 100.0 fL   MCH 25.3 (L) 26.0 - 34.0 pg   MCHC 31.4 30.0 - 36.0 g/dL   RDW 08.6 57.8 - 46.9 %   Platelets 227 150 - 400 K/uL  RPR   Collection Time: 04/14/15 8:15 PM  Result Value Ref Range   RPR Ser Ql Non Reactive Non Reactive  Type and screen   Collection Time: 04/14/15 8:15 PM  Result Value Ref Range   ABO/RH(D) O POS    Antibody Screen NEG    Sample Expiration 04/17/2015    Unit Number [id number]    Blood  Component Type RBC LR PHER2    Unit division 00    Status of  Unit ISSUED,FINAL    Transfusion Status OK TO TRANSFUSE    Crossmatch Result Compatible    Unit Number [id number]    Blood Component Type RBC LR PHER1    Unit division 00    Status of Unit ISSUED,FINAL    Transfusion Status OK TO TRANSFUSE    Crossmatch Result Compatible   ABO/Rh   Collection Time: 04/14/15 8:15 PM  Result Value Ref Range   ABO/RH(D) O POS   Group B strep by PCR   Collection Time: 04/14/15 9:25 PM  Result Value Ref Range   Group B strep by PCR POSITIVE (A) NEGATIVE  Prepare RBC   Collection Time: 04/15/15 4:30 AM  Result Value Ref Range   Order Confirmation ORDER PROCESSED BY BLOOD BANK   CBC   Collection Time: 04/15/15 1:14 PM  Result Value Ref Range   WBC 10.6 (H) 4.0 - 10.5 K/uL   RBC 2.27 (L) 3.87 - 5.11 MIL/uL   Hemoglobin 5.7 (LL) 12.0 - 15.0 g/dL   HCT 11.9 (L) 14.7 - 82.9 %   MCV 79.3 78.0 - 100.0 fL   MCH 25.1 (L) 26.0 - 34.0 pg   MCHC 31.7 30.0 - 36.0 g/dL   RDW 56.2 13.0 - 86.5 %   Platelets 187 150 - 400 K/uL  CBC   Collection Time: 04/16/15 6:32 AM  Result Value Ref Range   WBC 19.6 (H) 4.0 - 10.5 K/uL   RBC 3.09 (L) 3.87 - 5.11 MIL/uL   Hemoglobin 8.4 (L) 12.0 - 15.0 g/dL   HCT 78.4 (L) 69.6 - 29.5 %   MCV 81.6 78.0 - 100.0 fL   MCH 27.2 26.0 - 34.0 pg   MCHC 33.3 30.0 - 36.0 g/dL   RDW 28.4 13.2 - 44.0 %   Platelets 212 150 - 400 K/uL  Results for orders placed or performed in visit on 04/14/15 (from the past 336 hour(s))  POCT urinalysis dip (device)   Collection Time: 04/14/15 3:21 PM  Result Value Ref Range   Glucose, UA NEGATIVE NEGATIVE mg/dL   Bilirubin Urine NEGATIVE NEGATIVE   Ketones, ur NEGATIVE NEGATIVE mg/dL   Specific Gravity, Urine <=1.005 1.005 - 1.030   Hgb urine  dipstick NEGATIVE NEGATIVE   pH 5.5 5.0 - 8.0   Protein, ur NEGATIVE NEGATIVE mg/dL   Urobilinogen, UA 0.2 0.0 - 1.0 mg/dL   Nitrite NEGATIVE NEGATIVE   Leukocytes, UA NEGATIVE NEGATIVE    Imaging Studies:  Imaging Results    US Pelvis Complete  04/21/2015 CLINICAL DATA: 19 year old female 1 week postpartum presenting with vaginal bleeding. EXAM: LIMITED ULTRASOUND OF PELVIS TECHNIQUE: Limited transabdominal ultrasound examination of the pelvis was performed. COMPARISON: Obstetrical ultrasound dated 03/07/2015 FINDINGS: The uterus is enlarged and heterogeneous measuring 15 x 8 x 9 cm. Doppler images demonstrate diffuse hyperemia of the endometrium and surrounding myometrial tissue. An ill-defined heterogeneous predominantly solid collection may be present within the uterus measuring approximately 6 cm in thickness and is concerning for retained products of conception. Echogenic material noted within the lower uterine segment measuring approximately 2.2 cm in thickness likely representing blood clot. The right ovary measures 2.2 x 1.3 x 1.8 cm and the left ovary measures 3.1 x 1.9 x 3.2 cm. The ovaries are grossly unremarkable. IMPRESSION: Enlarged and hyperemic uterus with possible hyperemic collection. Findings may represent endometritis but are concerning for retained products of conception. Clinical correlation and obstetrical consult is advised. These results were called by telephone at the time of interpretation on  04/21/2015 at 9:45 pm to Dr. Crista Curb , who verbally acknowledged these results. Electronically Signed By: Elgie Collard M.D. On: 04/21/2015 21:45     Assessment: Patient Active Problem List   Diagnosis Date Noted  . Retained placenta with hemorrhage, postpartum condition 04/21/2015  . Postpartum endometritis 04/21/2015  . Retained products of conception with hemorrhage   . Nexplanon in place; placed postpartum in hospital on  04/16/15 04/16/2015  . NSVD (normal spontaneous vaginal delivery) on 04/15/2015 04/15/2015    Plan: Admit to [facility] Continue endometritis treatment with Zosyn Patient will undergo surgical management with D&E for RPOC; this will be done in the morning after a few doses of antibiotics. The surgeon will be Dr. Scheryl Darter (Attending on call 04/22/15 for [facility]); he was called and notified of this surgery booking. Risks of surgery including bleeding, infection, injury to surrounding organs, need for additional procedures, possibility of intrauterine scarring which may impair future fertility, risk of retained products which may require further management and other postoperative/anesthesia complications were explained to patient. Likelihood of success of complete evacuation of the uterus was discussed with the patient. Routine postoperative instructions will be reviewed with the patient and her family in detail after surgery. The patient concurred with the proposed plan, giving informed written consent for the surgery. Patient has been NPO since 2000 on 04/21/15; she will remain NPO for procedure. Anesthesiology team and OR aware. Patient understands that if her bleeding increases/becomes worrisome or if she has any other concerning symptoms overnight, the D&E may have to be performed urgently. OR and Anesthesiology team is also aware of this possibility Will continue close inpatient observation.     After procedure she remained afebrile and she received antibiotics. She was ready for discharge POD 1  Consults: None  Significant Diagnostic Studies: labs: CBC, [place] retained POC  Treatments: surgery: Suction D&C 04/22/15  Discharge Exam: Blood pressure 120/75, pulse 68, temperature 98.2 F (36.8 C), temperature source Oral, resp. rate 16, height 5\' 4"  (1.626 m), weight 57.924 kg (127 lb 11.2 oz), SpO2 100 %, unknown if currently breastfeeding. General appearance: alert,  cooperative and no distress GI: mild S/P tenderness Extremities: extremities normal, atraumatic, no cyanosis or edema  Disposition: 01-Home or Self Care     Medication List    STOP taking these medications        acetaminophen 500 MG tablet  Commonly known as:  TYLENOL      TAKE these medications        ferrous sulfate 325 (65 FE) MG tablet  Commonly known as:  FERROUSUL  Take 1 tablet (325 mg total) by mouth 2 (two) times daily.     ibuprofen 600 MG tablet  Commonly known as:  ADVIL,MOTRIN  Take 1 tablet (600 mg total) by mouth every 6 (six) hours.     oxyCODONE-acetaminophen 5-325 MG per tablet  Commonly known as:  PERCOCET/ROXICET  Take 1-2 tablets by mouth every 3 (three) hours as needed for severe pain (moderate to severe pain (when tolerating fluids)).     PRENATAL COMPLETE 14-0.4 MG Tabs  Take as directed      ASK your doctor about these medications        ondansetron 4 MG disintegrating tablet  Commonly known as:  ZOFRAN-ODT  Take 4 mg by mouth every 8 (eight) hours as needed for nausea or vomiting.           Follow-up Information    Follow up with [facility]  In 4 weeks.   Specialty:  Obstetrics and Gynecology   Contact information:   [address] [place] [address] [phone]      Signed: Scheryl Darter 04/23/2015, 7:19 AM

## 2015-05-03 ENCOUNTER — Inpatient Hospital Stay (HOSPITAL_COMMUNITY)
Admission: EM | Admit: 2015-05-03 | Discharge: 2015-05-10 | DRG: 871 | Disposition: A | Discharge status: Home / Self Care | Payer: Medicaid Other | Attending: Internal Medicine | Admitting: Internal Medicine

## 2015-05-03 ENCOUNTER — Emergency Department (HOSPITAL_COMMUNITY): Payer: Medicaid Other

## 2015-05-03 ENCOUNTER — Encounter (HOSPITAL_COMMUNITY): Payer: Self-pay

## 2015-05-03 DIAGNOSIS — R197 Diarrhea, unspecified: Secondary | ICD-10-CM | POA: Diagnosis present

## 2015-05-03 DIAGNOSIS — R000 Tachycardia, unspecified: Secondary | ICD-10-CM | POA: Diagnosis present

## 2015-05-03 DIAGNOSIS — N10 Acute tubulo-interstitial nephritis: Secondary | ICD-10-CM | POA: Diagnosis present

## 2015-05-03 DIAGNOSIS — O23599 Infection of other part of genital tract in pregnancy, unspecified trimester: Secondary | ICD-10-CM | POA: Diagnosis present

## 2015-05-03 DIAGNOSIS — O903 Peripartum cardiomyopathy: Secondary | ICD-10-CM | POA: Diagnosis present

## 2015-05-03 DIAGNOSIS — Z975 Presence of (intrauterine) contraceptive device: Secondary | ICD-10-CM | POA: Diagnosis not present

## 2015-05-03 DIAGNOSIS — I2699 Other pulmonary embolism without acute cor pulmonale: Secondary | ICD-10-CM

## 2015-05-03 DIAGNOSIS — I428 Other cardiomyopathies: Secondary | ICD-10-CM | POA: Diagnosis not present

## 2015-05-03 DIAGNOSIS — D509 Iron deficiency anemia, unspecified: Secondary | ICD-10-CM | POA: Diagnosis present

## 2015-05-03 DIAGNOSIS — J189 Pneumonia, unspecified organism: Secondary | ICD-10-CM

## 2015-05-03 DIAGNOSIS — R6521 Severe sepsis with septic shock: Secondary | ICD-10-CM | POA: Diagnosis present

## 2015-05-03 DIAGNOSIS — E872 Acidosis, unspecified: Secondary | ICD-10-CM | POA: Diagnosis present

## 2015-05-03 DIAGNOSIS — R17 Unspecified jaundice: Secondary | ICD-10-CM | POA: Diagnosis present

## 2015-05-03 DIAGNOSIS — G9341 Metabolic encephalopathy: Secondary | ICD-10-CM | POA: Diagnosis present

## 2015-05-03 DIAGNOSIS — J811 Chronic pulmonary edema: Secondary | ICD-10-CM | POA: Diagnosis present

## 2015-05-03 DIAGNOSIS — N179 Acute kidney failure, unspecified: Secondary | ICD-10-CM | POA: Diagnosis present

## 2015-05-03 DIAGNOSIS — J9601 Acute respiratory failure with hypoxia: Secondary | ICD-10-CM | POA: Diagnosis present

## 2015-05-03 DIAGNOSIS — O99519 Diseases of the respiratory system complicating pregnancy, unspecified trimester: Secondary | ICD-10-CM

## 2015-05-03 DIAGNOSIS — O8612 Endometritis following delivery: Secondary | ICD-10-CM

## 2015-05-03 DIAGNOSIS — B9620 Unspecified Escherichia coli [E. coli] as the cause of diseases classified elsewhere: Secondary | ICD-10-CM | POA: Diagnosis present

## 2015-05-03 DIAGNOSIS — I471 Supraventricular tachycardia: Secondary | ICD-10-CM | POA: Diagnosis not present

## 2015-05-03 DIAGNOSIS — Z87891 Personal history of nicotine dependence: Secondary | ICD-10-CM | POA: Diagnosis not present

## 2015-05-03 DIAGNOSIS — R451 Restlessness and agitation: Secondary | ICD-10-CM | POA: Diagnosis not present

## 2015-05-03 DIAGNOSIS — E0781 Sick-euthyroid syndrome: Secondary | ICD-10-CM | POA: Diagnosis present

## 2015-05-03 DIAGNOSIS — I7100 Dissection of unspecified site of aorta: Secondary | ICD-10-CM | POA: Diagnosis present

## 2015-05-03 DIAGNOSIS — D696 Thrombocytopenia, unspecified: Secondary | ICD-10-CM | POA: Diagnosis present

## 2015-05-03 DIAGNOSIS — E876 Hypokalemia: Secondary | ICD-10-CM | POA: Diagnosis present

## 2015-05-03 DIAGNOSIS — M549 Dorsalgia, unspecified: Secondary | ICD-10-CM | POA: Diagnosis present

## 2015-05-03 DIAGNOSIS — O2230 Deep phlebothrombosis in pregnancy, unspecified trimester: Secondary | ICD-10-CM

## 2015-05-03 DIAGNOSIS — D649 Anemia, unspecified: Secondary | ICD-10-CM | POA: Diagnosis present

## 2015-05-03 DIAGNOSIS — R6520 Severe sepsis without septic shock: Secondary | ICD-10-CM | POA: Diagnosis present

## 2015-05-03 DIAGNOSIS — N12 Tubulo-interstitial nephritis, not specified as acute or chronic: Secondary | ICD-10-CM | POA: Diagnosis not present

## 2015-05-03 DIAGNOSIS — N719 Inflammatory disease of uterus, unspecified: Secondary | ICD-10-CM | POA: Diagnosis present

## 2015-05-03 DIAGNOSIS — E0590 Thyrotoxicosis, unspecified without thyrotoxic crisis or storm: Secondary | ICD-10-CM | POA: Diagnosis present

## 2015-05-03 DIAGNOSIS — R7881 Bacteremia: Secondary | ICD-10-CM | POA: Diagnosis present

## 2015-05-03 DIAGNOSIS — D72829 Elevated white blood cell count, unspecified: Secondary | ICD-10-CM | POA: Diagnosis present

## 2015-05-03 DIAGNOSIS — A419 Sepsis, unspecified organism: Secondary | ICD-10-CM | POA: Diagnosis present

## 2015-05-03 DIAGNOSIS — R509 Fever, unspecified: Secondary | ICD-10-CM | POA: Diagnosis not present

## 2015-05-03 DIAGNOSIS — Z9289 Personal history of other medical treatment: Secondary | ICD-10-CM

## 2015-05-03 DIAGNOSIS — Z01818 Encounter for other preprocedural examination: Secondary | ICD-10-CM

## 2015-05-03 DIAGNOSIS — N170 Acute kidney failure with tubular necrosis: Secondary | ICD-10-CM | POA: Diagnosis not present

## 2015-05-03 DIAGNOSIS — A4151 Sepsis due to Escherichia coli [E. coli]: Secondary | ICD-10-CM | POA: Diagnosis not present

## 2015-05-03 DIAGNOSIS — Z452 Encounter for adjustment and management of vascular access device: Secondary | ICD-10-CM

## 2015-05-03 DIAGNOSIS — R0600 Dyspnea, unspecified: Secondary | ICD-10-CM | POA: Diagnosis not present

## 2015-05-03 DIAGNOSIS — N178 Other acute kidney failure: Secondary | ICD-10-CM | POA: Diagnosis not present

## 2015-05-03 DIAGNOSIS — I959 Hypotension, unspecified: Secondary | ICD-10-CM

## 2015-05-03 LAB — URINALYSIS, ROUTINE W REFLEX MICROSCOPIC
BILIRUBIN URINE: NEGATIVE
Glucose, UA: NEGATIVE mg/dL
KETONES UR: 15 mg/dL — AB
NITRITE: NEGATIVE
PROTEIN: 100 mg/dL — AB
Specific Gravity, Urine: 1.017 (ref 1.005–1.030)
UROBILINOGEN UA: 2 mg/dL — AB (ref 0.0–1.0)
pH: 5.5 (ref 5.0–8.0)

## 2015-05-03 LAB — URINE MICROSCOPIC-ADD ON

## 2015-05-03 LAB — I-STAT CG4 LACTIC ACID, ED
Lactic Acid, Venous: 2.05 mmol/L (ref 0.5–2.0)
Lactic Acid, Venous: 1.05 mmol/L (ref 0.5–2.0)

## 2015-05-03 LAB — TYPE AND SCREEN
ABO/RH(D): O POS
Antibody Screen: NEGATIVE

## 2015-05-03 LAB — COMPREHENSIVE METABOLIC PANEL
ALBUMIN: 2.5 g/dL — AB (ref 3.5–5.0)
ALK PHOS: 140 U/L — AB (ref 38–126)
ALT: 12 U/L — ABNORMAL LOW (ref 14–54)
ANION GAP: 12 (ref 5–15)
AST: 25 U/L (ref 15–41)
BILIRUBIN TOTAL: 1.4 mg/dL — AB (ref 0.3–1.2)
BUN: 23 mg/dL — AB (ref 6–20)
CALCIUM: 7.6 mg/dL — AB (ref 8.9–10.3)
CO2: 18 mmol/L — ABNORMAL LOW (ref 22–32)
Chloride: 100 mmol/L — ABNORMAL LOW (ref 101–111)
Creatinine, Ser: 1.49 mg/dL — ABNORMAL HIGH (ref 0.44–1.00)
GFR calc Af Amer: 58 mL/min — ABNORMAL LOW (ref >60)
GFR, EST NON AFRICAN AMERICAN: 50 mL/min — AB (ref >60)
GLUCOSE: 142 mg/dL — AB (ref 65–99)
POTASSIUM: 3.2 mmol/L — AB (ref 3.5–5.1)
Sodium: 130 mmol/L — ABNORMAL LOW (ref 135–145)
TOTAL PROTEIN: 6 g/dL — AB (ref 6.5–8.1)

## 2015-05-03 LAB — CBC WITH DIFFERENTIAL/PLATELET
BASOS ABS: 0 10*3/uL (ref 0.0–0.1)
BASOS PCT: 0 %
EOS PCT: 0 %
Eosinophils Absolute: 0 10*3/uL (ref 0.0–0.7)
HCT: 27.8 % — ABNORMAL LOW (ref 36.0–46.0)
Hemoglobin: 8.9 g/dL — ABNORMAL LOW (ref 12.0–15.0)
LYMPHS PCT: 4 %
Lymphs Abs: 0.6 10*3/uL — ABNORMAL LOW (ref 0.7–4.0)
MCH: 25.7 pg — ABNORMAL LOW (ref 26.0–34.0)
MCHC: 32 g/dL (ref 30.0–36.0)
MCV: 80.3 fL (ref 78.0–100.0)
MONO ABS: 1.2 10*3/uL — AB (ref 0.1–1.0)
Monocytes Relative: 7 %
NEUTROS ABS: 15.5 10*3/uL — AB (ref 1.7–7.7)
Neutrophils Relative %: 89 %
PLATELETS: 275 10*3/uL (ref 150–400)
RBC: 3.46 MIL/uL — AB (ref 3.87–5.11)
RDW: 16 % — AB (ref 11.5–15.5)
WBC: 17.3 10*3/uL — AB (ref 4.0–10.5)

## 2015-05-03 LAB — LIPASE, BLOOD: LIPASE: 14 U/L — AB (ref 22–51)

## 2015-05-03 LAB — PROTIME-INR
INR: 1.66 — ABNORMAL HIGH (ref 0.00–1.49)
PROTHROMBIN TIME: 19.6 s — AB (ref 11.6–15.2)

## 2015-05-03 LAB — APTT: aPTT: 34 seconds (ref 24–37)

## 2015-05-03 LAB — PROCALCITONIN: PROCALCITONIN: 9.78 ng/mL

## 2015-05-03 MED ORDER — IOHEXOL 300 MG/ML  SOLN
90.0000 mL | Freq: Once | INTRAMUSCULAR | Status: AC | PRN
  Administered 2015-05-03: 90 mL via INTRAVENOUS

## 2015-05-03 MED ORDER — SODIUM CHLORIDE 0.9 % IV BOLUS (SEPSIS)
1000.0000 mL | Freq: Once | INTRAVENOUS | Status: AC
  Administered 2015-05-03: 1000 mL via INTRAVENOUS

## 2015-05-03 MED ORDER — VANCOMYCIN HCL IN DEXTROSE 1-5 GM/200ML-% IV SOLN
1000.0000 mg | Freq: Once | INTRAVENOUS | Status: AC
  Administered 2015-05-03: 1000 mg via INTRAVENOUS
  Filled 2015-05-03: qty 200

## 2015-05-03 MED ORDER — IOHEXOL 300 MG/ML  SOLN
25.0000 mL | INTRAMUSCULAR | Status: AC
  Administered 2015-05-03: 25 mL via ORAL

## 2015-05-03 MED ORDER — HYDROMORPHONE HCL 1 MG/ML IJ SOLN
0.5000 mg | Freq: Once | INTRAMUSCULAR | Status: AC
  Administered 2015-05-03: 0.5 mg via INTRAVENOUS
  Filled 2015-05-03: qty 1

## 2015-05-03 MED ORDER — ACETAMINOPHEN 500 MG PO TABS
1000.0000 mg | ORAL_TABLET | Freq: Once | ORAL | Status: AC
  Administered 2015-05-03: 1000 mg via ORAL
  Filled 2015-05-03: qty 2

## 2015-05-03 MED ORDER — SODIUM CHLORIDE 0.9 % IV BOLUS (SEPSIS)
1000.0000 mL | INTRAVENOUS | Status: AC
  Administered 2015-05-03 (×2): 1000 mL via INTRAVENOUS

## 2015-05-03 MED ORDER — PIPERACILLIN-TAZOBACTAM 3.375 G IVPB 30 MIN
3.3750 g | Freq: Once | INTRAVENOUS | Status: AC
  Administered 2015-05-03: 3.375 g via INTRAVENOUS
  Filled 2015-05-03: qty 50

## 2015-05-03 NOTE — ED Provider Notes (Signed)
CSN: [id number]     Arrival date & time 05/03/15  1758 History   First MD Initiated Contact with Patient 05/03/15 1811     Chief Complaint  Patient presents with  . Abdominal Pain    hypotensive   Patient is a 19 y.o. female presenting with general illness. The history is provided by the patient, the EMS personnel and medical records. No language interpreter was used.  Illness Location:  Abdomen Quality:  Pain Severity:  Severe Onset quality:  Gradual Timing:  Constant Progression:  Worsening Chronicity:  Recurrent Context:  Abdominal pain. Patient is 2 weeks postpartum from vaginal delivery requiring induction secondary to intrauterine growth retardation. Patient had precipitous delivery which was Located by peripartum postpartum hemorrhage necessitating transfusion. Patient was GBS positive and not adequately covered with antibiotics and was eventually diagnosed with endometritis for which she is admitted to the hospital and underwent IV antibiotics. Patient d/c'd home x1 week ago w/ PO analgesia. Worsening pain for x2 days Associated symptoms: abdominal pain, fever and nausea   Associated symptoms: no chest pain, no cough, no shortness of breath and no vomiting     Past Medical History  Diagnosis Date  . Anemia    Past Surgical History  Procedure Laterality Date  . Dilation and evacuation N/A 04/22/2015    Procedure: DILATATION AND EVACUATION;  Surgeon: Adam Phenix, MD;  Location: [facility];  Service: Gynecology;  Laterality: N/A;   Family History  Problem Relation Age of Onset  . Cancer Mother    Social History  Substance Use Topics  . Smoking status: Former Smoker -- 0.00 packs/day    Types: Cigars  . Smokeless tobacco: Never Used  . Alcohol Use: No     Comment: occassionally   OB History    Gravida Para Term Preterm AB TAB SAB Ectopic Multiple Living   1 1 1       0 1      Review of Systems  Constitutional: Positive for fever, chills and appetite change.   Respiratory: Negative for cough and shortness of breath.   Cardiovascular: Negative for chest pain.  Gastrointestinal: Positive for nausea and abdominal pain. Negative for vomiting.  Genitourinary: Positive for dysuria, frequency and vaginal bleeding (improving).  All other systems reviewed and are negative.   Allergies  Review of patient's allergies indicates no known allergies.  Home Medications   Prior to Admission medications   Medication Sig Start Date End Date Taking? Authorizing Provider  etonogestrel (NEXPLANON) 68 MG IMPL implant 1 each by Subdermal route once. Implanted 04/16/15   Yes Historical Provider, MD  ferrous sulfate (FERROUSUL) 325 (65 FE) MG tablet Take 1 tablet (325 mg total) by mouth 2 (two) times daily. Patient taking differently: Take 325 mg by mouth daily with breakfast.  04/17/15  Yes Kathrynn Running, MD  ibuprofen (ADVIL,MOTRIN) 600 MG tablet Take 1 tablet (600 mg total) by mouth every 6 (six) hours. Patient not taking: Reported on 05/03/2015 04/17/15   Kathrynn Running, MD  oxyCODONE-acetaminophen (PERCOCET/ROXICET) 5-325 MG per tablet Take 1-2 tablets by mouth every 3 (three) hours as needed for severe pain (moderate to severe pain (when tolerating fluids)). Patient not taking: Reported on 05/03/2015 04/23/15   Adam Phenix, MD  Prenatal Vit-Fe Fumarate-FA (PRENATAL COMPLETE) 14-0.4 MG TABS Take as directed Patient not taking: Reported on 04/21/2015 02/18/15   Willodean Rosenthal, MD   BP 121/70 mmHg  Pulse 108  Temp(Src) 99.8 F (37.7 C) (Oral)  Resp 18  Ht 5\' 3"  (1.6 m)  Wt 127 lb (57.607 kg)  BMI 22.50 kg/m2  SpO2 100%  LMP  (LMP Unknown)  Breastfeeding? No   Physical Exam  Constitutional: She is oriented to person, place, and time. She appears distressed.  Young female lying in stretcher in mild to moderate distress secondary to pain  HENT:  Head: Normocephalic and atraumatic.  Eyes: Conjunctivae are normal. Pupils are equal, round, and reactive  to light.  Neck: Normal range of motion. Neck supple.  Cardiovascular: Regular rhythm and intact distal pulses.  Tachycardia present.   Pulmonary/Chest: Effort normal and breath sounds normal. No respiratory distress.  Abdominal: Soft. Bowel sounds are normal. She exhibits no distension. There is tenderness.  Tenderness to palpation of right abdomen and suprapubic region with guarding but no rebound  Musculoskeletal: Normal range of motion.  Neurological: She is alert and oriented to person, place, and time.  Skin: Skin is warm and dry. She is not diaphoretic.  Nursing note and vitals reviewed.   ED Course  Procedures   Labs Review Labs Reviewed  COMPREHENSIVE METABOLIC PANEL - Abnormal; Notable for the following:    Sodium 130 (*)    Potassium 3.2 (*)    Chloride 100 (*)    CO2 18 (*)    Glucose, Bld 142 (*)    BUN 23 (*)    Creatinine, Ser 1.49 (*)    Calcium 7.6 (*)    Total Protein 6.0 (*)    Albumin 2.5 (*)    ALT 12 (*)    Alkaline Phosphatase 140 (*)    Total Bilirubin 1.4 (*)    GFR calc non Af Amer 50 (*)    GFR calc Af Amer 58 (*)    All other components within normal limits  CBC WITH DIFFERENTIAL/PLATELET - Abnormal; Notable for the following:    WBC 17.3 (*)    RBC 3.46 (*)    Hemoglobin 8.9 (*)    HCT 27.8 (*)    MCH 25.7 (*)    RDW 16.0 (*)    Neutro Abs 15.5 (*)    Lymphs Abs 0.6 (*)    Monocytes Absolute 1.2 (*)    All other components within normal limits  URINALYSIS, ROUTINE W REFLEX MICROSCOPIC (NOT AT [facility]) - Abnormal; Notable for the following:    Color, Urine AMBER (*)    APPearance TURBID (*)    Hgb urine dipstick MODERATE (*)    Ketones, ur 15 (*)    Protein, ur 100 (*)    Urobilinogen, UA 2.0 (*)    Leukocytes, UA LARGE (*)    All other components within normal limits  LIPASE, BLOOD - Abnormal; Notable for the following:    Lipase 14 (*)    All other components within normal limits  PROTIME-INR - Abnormal; Notable for the following:     Prothrombin Time 19.6 (*)    INR 1.66 (*)    All other components within normal limits  URINE MICROSCOPIC-ADD ON - Abnormal; Notable for the following:    Squamous Epithelial / LPF FEW (*)    Bacteria, UA MANY (*)    All other components within normal limits  I-STAT CG4 LACTIC ACID, ED - Abnormal; Notable for the following:    Lactic Acid, Venous 2.05 (*)    All other components within normal limits  CULTURE, BLOOD (ROUTINE X 2)  CULTURE, BLOOD (ROUTINE X 2)  URINE CULTURE  C DIFFICILE QUICK SCREEN W PCR REFLEX  PROCALCITONIN  APTT  I-STAT CG4 LACTIC ACID, ED  TYPE AND SCREEN   Imaging Review Ct Abdomen Pelvis W Contrast  05/03/2015   CLINICAL DATA:  Abdominal pain, fever, chills. Post vaginal delivery 04/15/2015. Endometritis, recent D and C for retained products of conception. Now with septic shock.  EXAM: CT ABDOMEN AND PELVIS WITH CONTRAST  TECHNIQUE: Multidetector CT imaging of the abdomen and pelvis was performed using the standard protocol following bolus administration of intravenous contrast.  CONTRAST:  36mL OMNIPAQUE IOHEXOL 300 MG/ML  SOLN  COMPARISON:  No prior CT.  Pelvic ultrasound 04/21/2015  FINDINGS: Lower chest: Minimal dependent atelectasis at the lung bases. No consolidation or pleural effusion.  Liver: Periportal edema.  No focal lesion.  Hepatobiliary: Distended with gallbladder wall thickening. No calcified gallstone. No biliary dilatation.  Pancreas: No ductal dilatation or surrounding inflammation. Homogeneous enhancement.  Spleen: Normal.  Adrenal glands: Normal, no nodule.  No hyper enhancement.  Kidneys: Heterogeneous enhancement of the right kidney with right urothelial thickening, urothelial enhancement, and perinephric stranding. Findings are consistent with pyelonephritis. There is no intrarenal or perirenal fluid collection. Homogeneous enhancement throughout the left kidney. No abnormal left urothelial enhancement.  Stomach/Bowel: Stomach is decompressed.  There are no dilated or thickened small bowel loops. Normal of normal small bowel enhancement. Small volume of stool throughout the colon without colonic wall thickening. The appendix is not definitively identified.  Vascular/Lymphatic: Mild mesenteric and retroperitoneal haziness. No retroperitoneal adenopathy. Abdominal aorta is normal in caliber.  Reproductive: Postpartum appearance of the uterus. Mild periuterine haziness. Ovaries are not discretely visualized.  Bladder: Physiologically distended.  Other: No free air or intra-abdominal fluid collection. Trace perihepatic fluid.  Musculoskeletal: There are no acute or suspicious osseous abnormalities.  IMPRESSION: 1. Findings consistent with right pyelonephritis.  No renal abscess. 2. Periportal edema and diffuse gallbladder wall thickening. There is diffuse haziness of the mesentery and retroperitoneum, and small volume perihepatic ascites. Findings may be related to aggressive hydration and third-spacing in addition to reactive from the right pyelonephritis.   Electronically Signed   By: Rubye Oaks M.D.   On: 05/03/2015 22:28   Dg Chest Portable 1 View  05/03/2015   CLINICAL DATA:  Code sepsis. Patient with fever, chills and abdominal pains. Intense chest pressure. Shortness of breath yesterday.  EXAM: PORTABLE CHEST - 1 VIEW  COMPARISON:  None.  FINDINGS: Heart, mediastinum hila are unremarkable. Lungs are clear. No pleural effusion or pneumothorax. Skeletal structures are unremarkable.  IMPRESSION: No active disease.   Electronically Signed   By: Amie Portland M.D.   On: 05/03/2015 19:01   I have personally reviewed and evaluated these images and lab results as part of my medical decision-making.   EKG Interpretation   Date/Time:  Saturday May 03 2015 18:35:24 EDT Ventricular Rate:  126 PR Interval:  132 QRS Duration: 81 QT Interval:  298 QTC Calculation: 431 R Axis:   74 Text Interpretation:  Sinus tachycardia Ventricular  premature complex No  prior for comparison Confirmed by LIU MD, Annabelle Harman ([id number]) on 05/03/2015  7:02:38 PM      MDM  Ms. Duffy Rhody is a 19 yo female presenting via EMS with abdominal pain. Patient is 2 weeks postpartum from vaginal delivery requiring induction secondary to intrauterine growth retardation. Patient had precipitous delivery which was Located by peripartum postpartum hemorrhage necessitating transfusion. Patient was GBS positive and not adequately covered with antibiotics and was eventually diagnosed with endometritis for which she is admitted to the hospital and underwent IV antibiotics.  Patient discharged home one week ago with by mouth analgesia which she ran out of 2-3 days ago. Patient getting expressing worsening abdominal pain since then. Associated with fever, chills, dysuria, and urinary frequency.  Exam above notable for young female in moderate stretcher in mild/moderate distress. Febrile to 101.4F. tachycardic to 120's and 130's. Lowest SBP here in 90s. Tachypneic 20s. Maintaining saturations without supplemental oxygen. Abdominal tenderness noted to right sided suprapubic region with some guarding but no rebound.  Patient's presentation concerning for sepsis of possibly abdominal origin. I-STAT lactic acid 2.05. Blood and urine cultures collected inpatient initiated on IV fluids and broad-spectrum antibiotics with Vancomycin and Zosyn. Patient's blood pressure responded to IV fluids with SBP's now on low 100s. Chest x-ray showing no acute cardiopulmonary disease.  UA showing large leukocytes, negative nitrates, many bacteria, few epithelial cells. to CT scanner with CT abdomen/pelvis showing findings consistent with right pyelonephritis.  Patient admitted to hospitalist service for further evaluation and management of sepsis secondary to pyelonephritis. Patient understands and agrees with the plan and has no further questions or concerns this time.  Pt care discussed with and  followed by my attending, Dr. Ree Kida, MD Pager [phone]   Final diagnoses:  Pyelonephritis  Sepsis, due to unspecified organism  Hypotension, unspecified hypotension type  Leukocytosis     Angelina Ok, MD 05/04/15 0041  Lavera Guise, MD 05/04/15 6578

## 2015-05-03 NOTE — ED Notes (Signed)
Pt. Reports having abdominal pain, fever, chills, and hypotensive.  She denies any vaginal bleeding.

## 2015-05-04 DIAGNOSIS — A4151 Sepsis due to Escherichia coli [E. coli]: Secondary | ICD-10-CM

## 2015-05-04 DIAGNOSIS — D62 Acute posthemorrhagic anemia: Secondary | ICD-10-CM

## 2015-05-04 LAB — COMPREHENSIVE METABOLIC PANEL
ALBUMIN: 1.9 g/dL — AB (ref 3.5–5.0)
ALK PHOS: 149 U/L — AB (ref 38–126)
ALT: 14 U/L (ref 14–54)
AST: 28 U/L (ref 15–41)
Anion gap: 9 (ref 5–15)
BILIRUBIN TOTAL: 2.3 mg/dL — AB (ref 0.3–1.2)
BUN: 16 mg/dL (ref 6–20)
CALCIUM: 7 mg/dL — AB (ref 8.9–10.3)
CO2: 18 mmol/L — ABNORMAL LOW (ref 22–32)
Chloride: 112 mmol/L — ABNORMAL HIGH (ref 101–111)
Creatinine, Ser: 1.33 mg/dL — ABNORMAL HIGH (ref 0.44–1.00)
GFR calc Af Amer: >60 mL/min (ref >60)
GFR calc non Af Amer: 57 mL/min — ABNORMAL LOW (ref >60)
GLUCOSE: 107 mg/dL — AB (ref 65–99)
Potassium: 2.9 mmol/L — ABNORMAL LOW (ref 3.5–5.1)
Sodium: 139 mmol/L (ref 135–145)
TOTAL PROTEIN: 5.2 g/dL — AB (ref 6.5–8.1)

## 2015-05-04 LAB — LACTIC ACID, PLASMA: Lactic Acid, Venous: 1.6 mmol/L (ref 0.5–2.0)

## 2015-05-04 LAB — CBC
HEMATOCRIT: 26.4 % — AB (ref 36.0–46.0)
HEMOGLOBIN: 8.8 g/dL — AB (ref 12.0–15.0)
MCH: 26.5 pg (ref 26.0–34.0)
MCHC: 33.3 g/dL (ref 30.0–36.0)
MCV: 79.5 fL (ref 78.0–100.0)
Platelets: 206 10*3/uL (ref 150–400)
RBC: 3.32 MIL/uL — AB (ref 3.87–5.11)
RDW: 16 % — ABNORMAL HIGH (ref 11.5–15.5)
WBC: 11.6 10*3/uL — AB (ref 4.0–10.5)

## 2015-05-04 LAB — MRSA PCR SCREENING: MRSA by PCR: NEGATIVE

## 2015-05-04 LAB — MAGNESIUM: MAGNESIUM: 1.1 mg/dL — AB (ref 1.7–2.4)

## 2015-05-04 MED ORDER — POTASSIUM CHLORIDE CRYS ER 20 MEQ PO TBCR
40.0000 meq | EXTENDED_RELEASE_TABLET | Freq: Once | ORAL | Status: AC
  Administered 2015-05-04: 40 meq via ORAL
  Filled 2015-05-04: qty 2

## 2015-05-04 MED ORDER — ONDANSETRON HCL 4 MG/2ML IJ SOLN
4.0000 mg | Freq: Once | INTRAMUSCULAR | Status: AC
  Administered 2015-05-04: 4 mg via INTRAVENOUS
  Filled 2015-05-04: qty 2

## 2015-05-04 MED ORDER — VANCOMYCIN HCL 500 MG IV SOLR
500.0000 mg | Freq: Two times a day (BID) | INTRAVENOUS | Status: DC
  Administered 2015-05-04: 500 mg via INTRAVENOUS
  Filled 2015-05-04 (×3): qty 500

## 2015-05-04 MED ORDER — SODIUM CHLORIDE 0.9 % IV BOLUS (SEPSIS): 1000.0000 mL | INTRAVENOUS | Status: DC | PRN

## 2015-05-04 MED ORDER — ENOXAPARIN SODIUM 40 MG/0.4ML ~~LOC~~ SOLN
40.0000 mg | Freq: Every day | SUBCUTANEOUS | Status: DC
  Administered 2015-05-04 – 2015-05-07 (×3): 40 mg via SUBCUTANEOUS
  Filled 2015-05-04 (×5): qty 0.4

## 2015-05-04 MED ORDER — OXYCODONE HCL 5 MG PO TABS
5.0000 mg | ORAL_TABLET | ORAL | Status: DC | PRN
  Administered 2015-05-04: 10 mg via ORAL
  Administered 2015-05-04: 5 mg via ORAL
  Administered 2015-05-05 – 2015-05-06 (×4): 10 mg via ORAL
  Filled 2015-05-04: qty 1
  Filled 2015-05-04 (×5): qty 2

## 2015-05-04 MED ORDER — SODIUM CHLORIDE 0.9 % IV BOLUS (SEPSIS)
500.0000 mL | Freq: Once | INTRAVENOUS | Status: AC
  Administered 2015-05-04: 500 mL via INTRAVENOUS

## 2015-05-04 MED ORDER — PIPERACILLIN-TAZOBACTAM 3.375 G IVPB
3.3750 g | Freq: Three times a day (TID) | INTRAVENOUS | Status: DC
  Administered 2015-05-04 (×2): 3.375 g via INTRAVENOUS
  Filled 2015-05-04 (×4): qty 50

## 2015-05-04 MED ORDER — MORPHINE SULFATE (PF) 2 MG/ML IV SOLN
1.0000 mg | INTRAVENOUS | Status: DC | PRN
  Administered 2015-05-06: 2 mg via INTRAVENOUS
  Filled 2015-05-04: qty 1

## 2015-05-04 MED ORDER — IBUPROFEN 200 MG PO TABS
400.0000 mg | ORAL_TABLET | Freq: Four times a day (QID) | ORAL | Status: DC | PRN
  Administered 2015-05-04: 400 mg via ORAL
  Filled 2015-05-04: qty 2

## 2015-05-04 MED ORDER — ACETAMINOPHEN 325 MG PO TABS
650.0000 mg | ORAL_TABLET | ORAL | Status: DC | PRN
  Administered 2015-05-04 – 2015-05-07 (×7): 650 mg via ORAL
  Filled 2015-05-04 (×7): qty 2

## 2015-05-04 MED ORDER — FLORANEX PO PACK
1.0000 g | PACK | Freq: Three times a day (TID) | ORAL | Status: DC
  Administered 2015-05-04 – 2015-05-10 (×9): 1 g via ORAL
  Filled 2015-05-04 (×22): qty 1

## 2015-05-04 MED ORDER — SODIUM CHLORIDE 0.9 % IV SOLN
INTRAVENOUS | Status: DC
  Administered 2015-05-05: 01:00:00 via INTRAVENOUS

## 2015-05-04 MED ORDER — VANCOMYCIN 50 MG/ML ORAL SOLUTION
125.0000 mg | Freq: Four times a day (QID) | ORAL | Status: DC
  Administered 2015-05-04 (×2): 125 mg via ORAL
  Filled 2015-05-04 (×5): qty 2.5

## 2015-05-04 MED ORDER — SODIUM CHLORIDE 0.9 % IV SOLN
INTRAVENOUS | Status: DC
  Administered 2015-05-04: 02:00:00 via INTRAVENOUS

## 2015-05-04 MED ORDER — CEFTRIAXONE SODIUM 2 G IJ SOLR
2.0000 g | INTRAMUSCULAR | Status: DC
  Administered 2015-05-04 – 2015-05-05 (×2): 2 g via INTRAVENOUS
  Filled 2015-05-04 (×3): qty 2

## 2015-05-04 MED ORDER — ONDANSETRON HCL 4 MG/2ML IJ SOLN
4.0000 mg | Freq: Four times a day (QID) | INTRAMUSCULAR | Status: DC | PRN
  Administered 2015-05-06 – 2015-05-08 (×4): 4 mg via INTRAVENOUS
  Filled 2015-05-04 (×5): qty 2

## 2015-05-04 MED ORDER — SODIUM CHLORIDE 0.9 % IV SOLN: INTRAVENOUS | Status: DC

## 2015-05-04 NOTE — Progress Notes (Signed)
Utilization Review Completed.Dowell, Deanna T9/18/2016  

## 2015-05-04 NOTE — Progress Notes (Signed)
ANTIBIOTIC CONSULT NOTE - INITIAL  Pharmacy Consult for Vancomycin and Zosyn Indication: rule out sepsis  No Known Allergies  Patient Measurements: Height:  (160 cm) Weight: 128 lb 8.5 oz (58.3 kg) IBW/kg (Calculated) : 52.4 Adjusted Body Weight:   Vital Signs: Temp: 98.3 F (36.8 C) (09/18 0130) Temp Source: Oral (09/18 0130) BP: 105/62 mmHg (09/18 0130) Pulse Rate: 115 (09/18 0130) Intake/Output from previous day: 09/17 0701 - 09/18 0700 In: 1250 [IV Piggyback:1250] Out: -  Intake/Output from this shift: Total I/O In: 1250 [IV Piggyback:1250] Out: -   Labs:  Recent Labs  05/03/15 1820  WBC 17.3*  HGB 8.9*  PLT 275  CREATININE 1.49*   Estimated Creatinine Clearance: 50.2 mL/min (by C-G formula based on Cr of 1.49). No results for input(s): VANCOTROUGH, VANCOPEAK, VANCORANDOM, GENTTROUGH, GENTPEAK, GENTRANDOM, TOBRATROUGH, TOBRAPEAK, TOBRARND, AMIKACINPEAK, AMIKACINTROU, AMIKACIN in the last 72 hours.   Microbiology: Recent Results (from the past 720 hour(s))  OB RESULT CONSOLE Group B Strep     Status: None   Collection Time: 04/14/15 12:00 AM  Result Value Ref Range Status   GBS Positive  Final  Group B strep by PCR     Status: Abnormal   Collection Time: 04/14/15  9:25 PM  Result Value Ref Range Status   Group B strep by PCR POSITIVE (A) NEGATIVE Final    Comment: RESULT CALLED TO, READ BACK BY AND VERIFIED WITH: RN KNOXX,H @ 2330 ON 04/14/15 BY FULKS,C   Wet prep, genital     Status: Abnormal   Collection Time: 04/21/15  9:56 PM  Result Value Ref Range Status   Yeast Wet Prep HPF POC NONE SEEN NONE SEEN Final   Trich, Wet Prep NONE SEEN NONE SEEN Final   Clue Cells Wet Prep HPF POC FEW (A) NONE SEEN Final   WBC, Wet Prep HPF POC TOO NUMEROUS TO COUNT (A) NONE SEEN Final  Surgical pcr screen     Status: Abnormal   Collection Time: 04/22/15 12:32 AM  Result Value Ref Range Status   MRSA, PCR NEGATIVE NEGATIVE Final   Staphylococcus aureus  POSITIVE (A) NEGATIVE Final    Comment:        The Xpert SA Assay (FDA approved for NASAL specimens in patients over 43 years of age), is one component of a comprehensive surveillance program.  Test performance has been validated by [facility] for patients greater than or equal to 22 year old. It is not intended to diagnose infection nor to guide or monitor treatment. RESULT CALLED TO, READ BACK BY AND VERIFIED WITH: RN [facility] @ 0411 ON 04/22/15 BY FULKS,C     Medical History: Past Medical History  Diagnosis Date  . Anemia     Medications:  Prescriptions prior to admission  Medication Sig Dispense Refill Last Dose  . etonogestrel (NEXPLANON) 68 MG IMPL implant 1 each by Subdermal route once. Implanted 04/16/15     . ferrous sulfate (FERROUSUL) 325 (65 FE) MG tablet Take 1 tablet (325 mg total) by mouth 2 (two) times daily. (Patient taking differently: Take 325 mg by mouth daily with breakfast. ) 60 tablet 1 05/02/2015 at Unknown time  . ibuprofen (ADVIL,MOTRIN) 600 MG tablet Take 1 tablet (600 mg total) by mouth every 6 (six) hours. (Patient not taking: Reported on 05/03/2015) 30 tablet 0 Not Taking at Unknown time  . oxyCODONE-acetaminophen (PERCOCET/ROXICET) 5-325 MG per tablet Take 1-2 tablets by mouth every 3 (three) hours as needed for severe pain (moderate to  severe pain (when tolerating fluids)). (Patient not taking: Reported on 05/03/2015) 20 tablet 0 Not Taking at Unknown time  . Prenatal Vit-Fe Fumarate-FA (PRENATAL COMPLETE) 14-0.4 MG TABS Take as directed (Patient not taking: Reported on 04/21/2015) 60 each 0 Not Taking at Unknown time   Scheduled:  . enoxaparin (LOVENOX) injection  40 mg Subcutaneous Daily  . lactobacillus  1 g Oral TID WC   Infusions:  . sodium chloride     Assessment: 19yo female with history of anemia presents with abdominal pain. Pharmacy is consulted to dose vancomycin and zosyn for suspected sepsis. Pt is afebrile, WBC 17.3, sCr 1.49, PCT 9.78,  LA 2.05>1.05.  Pt received vancomycin 1g and zosyn 3.375g once in the ED.  Goal of Therapy:  Vancomycin trough level 15-20 mcg/ml  Plan:  Vancomycin 500mg  IV q12h Zosyn 3.375g IV q8h Measure antibiotic drug levels at steady state Follow up culture results, renal function and clinical course  Arlean Hopping. Newman Pies, PharmD Clinical Pharmacist Pager [phone] 05/04/2015,2:09 AM

## 2015-05-04 NOTE — Progress Notes (Signed)
[facility] TEAM 1 - Stepdown/ICU TEAM PROGRESS NOTE  Deanna Sullivan JJO:[id number] DOB: Oct 08, 1995 DOA: 05/03/2015 PCP: No primary care provider on file.  Admit HPI / Brief Narrative: 19 y.o. female who is two weeks postpartum from vaginal delivery which was complicated by hemorrhage requiring PRBC transfusion as well as endometritis requiring D&E and abx. She was brought to the ER by EMS due to right sided ab pain, back pain and was found to be hypotensive, in sinus tachycardia, w/ leukocytosis and lactic acidosis.  CT ab showed right sided pyelo.  HPI/Subjective: Pt seen for f/u visit.  Assessment/Plan:  Severe sepsis with right sided E coli pyelonephritis  Diarrhea  Most likely due to pyelo   Acute renal failure crt 1.49 at presentation   Hypokalemia   Recent endometritis   Normocytic anemia   Code Status: FULL Family Communication: no family present at time of exam Disposition Plan: SDU  Consultants: none  Procedures: none  Antibiotics: Zosyn 9/17 > 9/18 Vanc 9/17 > 9/18 Rocephin 9/18 >  DVT prophylaxis: lovenox   Objective: Blood pressure 108/60, pulse 128, temperature 99.1 F (37.3 C), temperature source Oral, resp. rate 28, height 5\' 3"  (1.6 m), weight 58.3 kg (128 lb 8.5 oz), SpO2 100 %, not currently breastfeeding.  Intake/Output Summary (Last 24 hours) at 05/04/15 1657 Last data filed at 05/04/15 1600  Gross per 24 hour  Intake   4065 ml  Output      0 ml  Net   4065 ml   Exam: Pt seen for f/u visit.  Data Reviewed: Basic Metabolic Panel:  Recent Labs Lab 05/03/15 1820 05/04/15 0600  NA 130* 139  K 3.2* 2.9*  CL 100* 112*  CO2 18* 18*  GLUCOSE 142* 107*  BUN 23* 16  CREATININE 1.49* 1.33*  CALCIUM 7.6* 7.0*    CBC:  Recent Labs Lab 05/03/15 1820 05/04/15 0600  WBC 17.3* 11.6*  NEUTROABS 15.5*  --   HGB 8.9* 8.8*  HCT 27.8* 26.4*  MCV 80.3 79.5  PLT 275 206    Liver Function Tests:  Recent Labs Lab 05/03/15 1820  05/04/15 0600  AST 25 28  ALT 12* 14  ALKPHOS 140* 149*  BILITOT 1.4* 2.3*  PROT 6.0* 5.2*  ALBUMIN 2.5* 1.9*    Recent Labs Lab 05/03/15 1820  LIPASE 14*   Coags:  Recent Labs Lab 05/03/15 1820  INR 1.66*    Recent Labs Lab 05/03/15 1820  APTT 34     Recent Results (from the past 240 hour(s))  Blood Culture (routine x 2)     Status: None (Preliminary result)   Collection Time: 05/03/15  6:15 PM  Result Value Ref Range Status   Specimen Description BLOOD LEFT FOREARM  Final   Special Requests BOTTLES DRAWN AEROBIC AND ANAEROBIC 5CC  Final   Culture  Setup Time   Final    GRAM NEGATIVE RODS IN BOTH AEROBIC AND ANAEROBIC BOTTLES CRITICAL RESULT CALLED TO, READ BACK BY AND VERIFIED WITH: T DAVIS,RN AT 0846 05/04/15 BY L BENFIELD    Culture GRAM NEGATIVE RODS  Final   Report Status PENDING  Incomplete  Urine culture     Status: None (Preliminary result)   Collection Time: 05/03/15  8:08 PM  Result Value Ref Range Status   Specimen Description URINE, CLEAN CATCH  Final   Special Requests NONE  Final   Culture >=100,000 COLONIES/mL ESCHERICHIA COLI  Final   Report Status PENDING  Incomplete  MRSA PCR Screening  Status: None   Collection Time: 05/04/15  7:00 AM  Result Value Ref Range Status   MRSA by PCR NEGATIVE NEGATIVE Final    Comment:        The GeneXpert MRSA Assay (FDA approved for NASAL specimens only), is one component of a comprehensive MRSA colonization surveillance program. It is not intended to diagnose MRSA infection nor to guide or monitor treatment for MRSA infections.      Studies:   Recent x-ray studies have been reviewed in detail by the Attending Physician  Scheduled Meds:  Scheduled Meds: . enoxaparin (LOVENOX) injection  40 mg Subcutaneous Daily  . lactobacillus  1 g Oral TID WC  . piperacillin-tazobactam (ZOSYN)  IV  3.375 g Intravenous Q8H  . vancomycin  125 mg Oral 4 times per day  . vancomycin  500 mg Intravenous Q12H      Time spent on care of this patient: No charge   Lonia Blood , MD   [facility] Office  [phone] Pager - Text Page per Amion as per below:  On-Call/Text Page:      [email or web address]      password [email or web address]  If 7PM-7AM, please contact night-coverage [email or web address] Password [email or web address] 05/04/2015, 4:57 PM   LOS: 1 day

## 2015-05-04 NOTE — H&P (Signed)
History and Physical  Deanna Sullivan ZOX:[id number] DOB: 04-08-1996 DOA: 05/03/2015  Referring physician: EDP PCP: No primary care provider on file.   Chief Complaint: ab pain/fever  HPI: Deanna Sullivan is a 19 y.o. female   Two weeks postpartum from vaginal delivery which was complicated by postpartum hemorrhage requiring prbc  Transfusion and postpartum endometritis required D&E and abx. She was brought to the ER by EMS due to right sided ab pain, back pain, she was found to be hypotensive, sinus tachycardia, leukocytosis, lactic acidosis, CT ab showed +right sided pyelo, she is given vanc/zosyn and hospitalist called to admit the patient.  Currently patient reported feeling a little better, she reported not recent vaginal discharge or bleed, but did developed n/v and diarrhea today.   Review of Systems:  Detail per HPI, Review of systems are otherwise negative  Past Medical History  Diagnosis Date  . Anemia    Past Surgical History  Procedure Laterality Date  . Dilation and evacuation N/A 04/22/2015    Procedure: DILATATION AND EVACUATION;  Surgeon: Adam Phenix, MD;  Location: [facility];  Service: Gynecology;  Laterality: N/A;   Social History:  reports that she has quit smoking. Her smoking use included Cigars. She has never used smokeless tobacco. She reports that she does not drink alcohol or use illicit drugs. Patient lives at home & is able to participate in activities of daily living independently , she is currently a Theatre stage manager.  No Known Allergies  Family History  Problem Relation Age of Onset  . Cancer Mother       Prior to Admission medications   Medication Sig Start Date End Date Taking? Authorizing Provider  etonogestrel (NEXPLANON) 68 MG IMPL implant 1 each by Subdermal route once. Implanted 04/16/15   Yes Historical Provider, MD  ferrous sulfate (FERROUSUL) 325 (65 FE) MG tablet Take 1 tablet (325 mg total) by mouth 2 (two) times daily. Patient taking  differently: Take 325 mg by mouth daily with breakfast.  04/17/15  Yes Kathrynn Running, MD  ibuprofen (ADVIL,MOTRIN) 600 MG tablet Take 1 tablet (600 mg total) by mouth every 6 (six) hours. Patient not taking: Reported on 05/03/2015 04/17/15   Kathrynn Running, MD  oxyCODONE-acetaminophen (PERCOCET/ROXICET) 5-325 MG per tablet Take 1-2 tablets by mouth every 3 (three) hours as needed for severe pain (moderate to severe pain (when tolerating fluids)). Patient not taking: Reported on 05/03/2015 04/23/15   Adam Phenix, MD  Prenatal Vit-Fe Fumarate-FA (PRENATAL COMPLETE) 14-0.4 MG TABS Take as directed Patient not taking: Reported on 04/21/2015 02/18/15   Willodean Rosenthal, MD    Physical Exam: BP 105/62 mmHg  Pulse 115  Temp(Src) 98.3 F (36.8 C) (Oral)  Resp 28  Ht  (1.6 m)  Wt 128 lb 8.5 oz (58.3 kg)  BMI 22.77 kg/m2  SpO2 100%  LMP  (LMP Unknown)  Breastfeeding? No  General:  Weak, but NAD Eyes: PERRL ENT: unremarkable Neck: supple, no JVD Cardiovascular: RRR Respiratory: CTABL Abdomen: soft/NT/ND, positive bowel sounds, positive right sided CVA tenderness. Skin: no rash Musculoskeletal:  No edema Psychiatric: calm/cooperative Neurologic: no focal findings            Labs on Admission:  Basic Metabolic Panel:  Recent Labs Lab 05/03/15 1820  NA 130*  K 3.2*  CL 100*  CO2 18*  GLUCOSE 142*  BUN 23*  CREATININE 1.49*  CALCIUM 7.6*   Liver Function Tests:  Recent Labs Lab 05/03/15 1820  AST 25  ALT 12*  ALKPHOS 140*  BILITOT 1.4*  PROT 6.0*  ALBUMIN 2.5*    Recent Labs Lab 05/03/15 1820  LIPASE 14*   No results for input(s): AMMONIA in the last 168 hours. CBC:  Recent Labs Lab 05/03/15 1820  WBC 17.3*  NEUTROABS 15.5*  HGB 8.9*  HCT 27.8*  MCV 80.3  PLT 275   Cardiac Enzymes: No results for input(s): CKTOTAL, CKMB, CKMBINDEX, TROPONINI in the last 168 hours.  BNP (last 3 results) No results for input(s): BNP in the last 8760  hours.  ProBNP (last 3 results) No results for input(s): PROBNP in the last 8760 hours.  CBG: No results for input(s): GLUCAP in the last 168 hours.  Radiological Exams on Admission: Ct Abdomen Pelvis W Contrast  05/03/2015   CLINICAL DATA:  Abdominal pain, fever, chills. Post vaginal delivery 04/15/2015. Endometritis, recent D and C for retained products of conception. Now with septic shock.  EXAM: CT ABDOMEN AND PELVIS WITH CONTRAST  TECHNIQUE: Multidetector CT imaging of the abdomen and pelvis was performed using the standard protocol following bolus administration of intravenous contrast.  CONTRAST:  90mL OMNIPAQUE IOHEXOL 300 MG/ML  SOLN  COMPARISON:  No prior CT.  Pelvic ultrasound 04/21/2015  FINDINGS: Lower chest: Minimal dependent atelectasis at the lung bases. No consolidation or pleural effusion.  Liver: Periportal edema.  No focal lesion.  Hepatobiliary: Distended with gallbladder wall thickening. No calcified gallstone. No biliary dilatation.  Pancreas: No ductal dilatation or surrounding inflammation. Homogeneous enhancement.  Spleen: Normal.  Adrenal glands: Normal, no nodule.  No hyper enhancement.  Kidneys: Heterogeneous enhancement of the right kidney with right urothelial thickening, urothelial enhancement, and perinephric stranding. Findings are consistent with pyelonephritis. There is no intrarenal or perirenal fluid collection. Homogeneous enhancement throughout the left kidney. No abnormal left urothelial enhancement.  Stomach/Bowel: Stomach is decompressed. There are no dilated or thickened small bowel loops. Normal of normal small bowel enhancement. Small volume of stool throughout the colon without colonic wall thickening. The appendix is not definitively identified.  Vascular/Lymphatic: Mild mesenteric and retroperitoneal haziness. No retroperitoneal adenopathy. Abdominal aorta is normal in caliber.  Reproductive: Postpartum appearance of the uterus. Mild periuterine haziness.  Ovaries are not discretely visualized.  Bladder: Physiologically distended.  Other: No free air or intra-abdominal fluid collection. Trace perihepatic fluid.  Musculoskeletal: There are no acute or suspicious osseous abnormalities.  IMPRESSION: 1. Findings consistent with right pyelonephritis.  No renal abscess. 2. Periportal edema and diffuse gallbladder wall thickening. There is diffuse haziness of the mesentery and retroperitoneum, and small volume perihepatic ascites. Findings may be related to aggressive hydration and third-spacing in addition to reactive from the right pyelonephritis.   Electronically Signed   By: Rubye Oaks M.D.   On: 05/03/2015 22:28   Dg Chest Portable 1 View  05/03/2015   CLINICAL DATA:  Code sepsis. Patient with fever, chills and abdominal pains. Intense chest pressure. Shortness of breath yesterday.  EXAM: PORTABLE CHEST - 1 VIEW  COMPARISON:  None.  FINDINGS: Heart, mediastinum hila are unremarkable. Lungs are clear. No pleural effusion or pneumothorax. Skeletal structures are unremarkable.  IMPRESSION: No active disease.   Electronically Signed   By: Amie Portland M.D.   On: 05/03/2015 19:01    EKG: Independently reviewed. Sinus tachycardia  Assessment/Plan Present on Admission:  . Sepsis  Sever sepsis with right sided pyelonephritis: patient presented with fever, sinus tachycardia, leukocytosis, lactic acidosis. Culture pending, continue vanc/zosyn. Culture pending.  ARF: from pyelo, continue bax/ivf, repeat  labs in am.  Elevation of lft: CT ab showed diffuse gallbladder wall  Thickening, possible related to hydration and reactive from right pyelo, repeat labs in am.  Recent h/o endometritis, reported fully treated with abx, denies lower ab pain, no vaginal discharge or bleed.   H/o anemia from vaginal bleed, hgb has been stable at 8.9.   Reported diarrhea x2 today, c diff pending. Empirically start oral vanc.  DVT prophylaxis: lovenox  subQ  Consultants: none  Code Status: full   Family Communication:  Patient   Disposition Plan: admit to stepdown  Time spent:  Xu,Fang MD, PhD [facility] Pager [phone] If 7PM-7AM, please contact night-coverage at [email or web address], password [facility]

## 2015-05-04 NOTE — Progress Notes (Signed)
Pt temp was 100.3 650 tylenol given @2130  HR 140's pt asymtomatic recheck at 2245 temp was 102.3 HR 150's sinus rhythm paged Lenny Pastel to notify of Temp and HR he ordered 400 advil.  Upon recheck pt temp was 101.3 and HR 138-143 still asyptomatic will continue to monitor.

## 2015-05-05 ENCOUNTER — Inpatient Hospital Stay (HOSPITAL_COMMUNITY): Payer: Medicaid Other

## 2015-05-05 DIAGNOSIS — O2230 Deep phlebothrombosis in pregnancy, unspecified trimester: Secondary | ICD-10-CM

## 2015-05-05 LAB — COMPREHENSIVE METABOLIC PANEL
ALBUMIN: 1.7 g/dL — AB (ref 3.5–5.0)
ALT: 12 U/L — ABNORMAL LOW (ref 14–54)
ANION GAP: 9 (ref 5–15)
AST: 19 U/L (ref 15–41)
Alkaline Phosphatase: 123 U/L (ref 38–126)
BILIRUBIN TOTAL: 2 mg/dL — AB (ref 0.3–1.2)
BUN: 10 mg/dL (ref 6–20)
CO2: 15 mmol/L — ABNORMAL LOW (ref 22–32)
Calcium: 6.8 mg/dL — ABNORMAL LOW (ref 8.9–10.3)
Chloride: 114 mmol/L — ABNORMAL HIGH (ref 101–111)
Creatinine, Ser: 1.32 mg/dL — ABNORMAL HIGH (ref 0.44–1.00)
GFR calc non Af Amer: 58 mL/min — ABNORMAL LOW (ref >60)
GLUCOSE: 104 mg/dL — AB (ref 65–99)
POTASSIUM: 2.9 mmol/L — AB (ref 3.5–5.1)
Sodium: 138 mmol/L (ref 135–145)
TOTAL PROTEIN: 4.5 g/dL — AB (ref 6.5–8.1)

## 2015-05-05 LAB — BASIC METABOLIC PANEL
ANION GAP: 7 (ref 5–15)
BUN: 7 mg/dL (ref 6–20)
CALCIUM: 7.6 mg/dL — AB (ref 8.9–10.3)
CO2: 18 mmol/L — ABNORMAL LOW (ref 22–32)
Chloride: 114 mmol/L — ABNORMAL HIGH (ref 101–111)
Creatinine, Ser: 1.16 mg/dL — ABNORMAL HIGH (ref 0.44–1.00)
Glucose, Bld: 92 mg/dL (ref 65–99)
POTASSIUM: 3.5 mmol/L (ref 3.5–5.1)
SODIUM: 139 mmol/L (ref 135–145)

## 2015-05-05 LAB — CBC
HEMATOCRIT: 24.4 % — AB (ref 36.0–46.0)
HEMOGLOBIN: 7.9 g/dL — AB (ref 12.0–15.0)
MCH: 25.2 pg — ABNORMAL LOW (ref 26.0–34.0)
MCHC: 32.4 g/dL (ref 30.0–36.0)
MCV: 77.7 fL — ABNORMAL LOW (ref 78.0–100.0)
Platelets: 198 10*3/uL (ref 150–400)
RBC: 3.14 MIL/uL — AB (ref 3.87–5.11)
RDW: 16 % — ABNORMAL HIGH (ref 11.5–15.5)
WBC: 5.9 10*3/uL (ref 4.0–10.5)

## 2015-05-05 LAB — URINE CULTURE

## 2015-05-05 LAB — TSH: TSH: 0.338 u[IU]/mL — AB (ref 0.350–4.500)

## 2015-05-05 LAB — MAGNESIUM: MAGNESIUM: 2.7 mg/dL — AB (ref 1.7–2.4)

## 2015-05-05 MED ORDER — METOPROLOL TARTRATE 1 MG/ML IV SOLN
10.0000 mg | Freq: Four times a day (QID) | INTRAVENOUS | Status: DC | PRN
  Administered 2015-05-06: 10 mg via INTRAVENOUS
  Filled 2015-05-05 (×2): qty 10

## 2015-05-05 MED ORDER — MAGNESIUM SULFATE 2 GM/50ML IV SOLN
2.0000 g | Freq: Once | INTRAVENOUS | Status: AC
  Administered 2015-05-05: 2 g via INTRAVENOUS
  Filled 2015-05-05: qty 50

## 2015-05-05 MED ORDER — POTASSIUM CHLORIDE CRYS ER 20 MEQ PO TBCR
40.0000 meq | EXTENDED_RELEASE_TABLET | Freq: Three times a day (TID) | ORAL | Status: DC
  Administered 2015-05-05 – 2015-05-06 (×3): 40 meq via ORAL
  Filled 2015-05-05 (×5): qty 2

## 2015-05-05 MED ORDER — POTASSIUM CHLORIDE IN NACL 20-0.45 MEQ/L-% IV SOLN
INTRAVENOUS | Status: DC
Start: 2015-05-05 — End: 2015-05-06
  Administered 2015-05-05 – 2015-05-06 (×3): via INTRAVENOUS
  Filled 2015-05-05 (×5): qty 1000

## 2015-05-05 MED ORDER — SODIUM CHLORIDE 0.9 % IV BOLUS (SEPSIS)
500.0000 mL | Freq: Once | INTRAVENOUS | Status: AC
  Administered 2015-05-05: 500 mL via INTRAVENOUS

## 2015-05-05 MED ORDER — POTASSIUM CHLORIDE CRYS ER 20 MEQ PO TBCR
40.0000 meq | EXTENDED_RELEASE_TABLET | Freq: Two times a day (BID) | ORAL | Status: DC
  Administered 2015-05-05: 40 meq via ORAL
  Filled 2015-05-05: qty 2

## 2015-05-05 NOTE — Progress Notes (Signed)
Pt magnesium level was 1.1 paged Deanna Sullivan new orders received will continue to monitor

## 2015-05-05 NOTE — Progress Notes (Signed)
*  Preliminary Results* Bilateral lower extremity venous duplex completed. Bilateral lower extremities are negative for deep vein thrombosis. There is no evidence of Baker's cyst bilaterally.  05/05/2015  Gertie Fey, RVT, RDCS, RDMS

## 2015-05-05 NOTE — Progress Notes (Signed)
[place] TEAM 1 - Stepdown/ICU TEAM PROGRESS NOTE  Deanna Sullivan ZOX:[id number] DOB: 1995/12/22 DOA: 05/03/2015 PCP: No primary care provider on file.  Admit HPI / Brief Narrative: 19 y.o. female who is two weeks postpartum from vaginal delivery which was complicated by hemorrhage requiring PRBC transfusion as well as endometritis requiring D&E and abx. She was brought to the ER by EMS due to right sided ab pain, back pain and was found to be hypotensive, in sinus tachycardia, w/ leukocytosis and lactic acidosis.  CT ab showed right sided pyelo.  HPI/Subjective: The patient states she feels much better.  She is anxious to be discharged home because she misses her brand-new baby.  She denies chest pain shortness of breath fevers chills nausea or vomiting.  She denies calf pain or leg cramps.  She states her abdomen feels completely normal.  She was able to tolerate some grits this morning without difficulty.  Assessment/Plan:  Severe sepsis with right sided E coli pyelonephritis and E coli bacteremia  persisting fevers - remains tachycardic - increase volume resuscitation - lactate has normalized - CT at time of admission did not note abscess but if pt does not soon show more signif signs of improvement will have to consider possibility of occult abscess or other source of ongoing infxn   Diarrhea  Most likely due to pyelo - appears to be slowing down - follow   Acute renal failure crt 1.49 at presentation - slowly improving w/ volume expansion - follow   Hypokalemia  Replace Mg first then resume K replacement   Severe hypomagnesemia Replace via IV and f/u later today   Sinus tachycardia  Most c/w sepsis due to bacteremia, but given her recent difficult delivery she would be at increased risk of DVT/PE - index of suspicion is low/mod so will not empirically dose w/ heparin - avoid IV contrast as able at this time due to risk of ATN - check LE venous duplex - d-dimer not likely to be  helpful as would expect it to be elevated   Recent endometritis  As above - may require repeat GYN eval if infxn sx do not improve   Normocytic anemia in setting of recent post-delivery uterine bleeding  Transfuse if Hgb drops below 7.0, or if tachycardia persists despite ongoing volume expansion   Code Status: FULL Family Communication: no family present at time of exam Disposition Plan: SDU  Consultants: none  Procedures: none  Antibiotics: Zosyn 9/17 > 9/18 Vanc 9/17 > 9/18 Rocephin 9/18 >  DVT prophylaxis: lovenox   Objective: Blood pressure 107/62, pulse 121, temperature 98.2 F (36.8 C), temperature source Oral, resp. rate 27, height  (1.6 m), weight 58.3 kg (128 lb 8.5 oz), SpO2 96 %, not currently breastfeeding.  Intake/Output Summary (Last 24 hours) at 05/05/15 1119 Last data filed at 05/05/15 0600  Gross per 24 hour  Intake   3790 ml  Output    900 ml  Net   2890 ml   Exam: General: No acute respiratory distress - alert and conversant  Lungs: Clear to auscultation bilaterally without wheezes or crackles Cardiovascular: Tachycardic but regular without appreciable murmur gallop or rub Abdomen: Nontender, nondistended, soft, bowel sounds positive, no rebound, no ascites, no appreciable mass - no CVA tenderness at this time Extremities: No significant cyanosis, clubbing, or edema bilateral lower extremities   Data Reviewed: Basic Metabolic Panel:  Recent Labs Lab 05/03/15 1820 05/04/15 0600 05/04/15 1900 05/05/15 0225  NA 130* 139  --  138  K 3.2* 2.9*  --  2.9*  CL 100* 112*  --  114*  CO2 18* 18*  --  15*  GLUCOSE 142* 107*  --  104*  BUN 23* 16  --  10  CREATININE 1.49* 1.33*  --  1.32*  CALCIUM 7.6* 7.0*  --  6.8*  MG  --   --  1.1*  --     CBC:  Recent Labs Lab 05/03/15 1820 05/04/15 0600 05/05/15 0225  WBC 17.3* 11.6* 5.9  NEUTROABS 15.5*  --   --   HGB 8.9* 8.8* 7.9*  HCT 27.8* 26.4* 24.4*  MCV 80.3 79.5 77.7*  PLT 275 206  198    Liver Function Tests:  Recent Labs Lab 05/03/15 1820 05/04/15 0600 05/05/15 0225  AST 25 28 19   ALT 12* 14 12*  ALKPHOS 140* 149* 123  BILITOT 1.4* 2.3* 2.0*  PROT 6.0* 5.2* 4.5*  ALBUMIN 2.5* 1.9* 1.7*    Recent Labs Lab 05/03/15 1820  LIPASE 14*   Coags:  Recent Labs Lab 05/03/15 1820  INR 1.66*    Recent Labs Lab 05/03/15 1820  APTT 34     Recent Results (from the past 240 hour(s))  Blood Culture (routine x 2)     Status: None (Preliminary result)   Collection Time: 05/03/15  6:15 PM  Result Value Ref Range Status   Specimen Description BLOOD LEFT FOREARM  Final   Special Requests BOTTLES DRAWN AEROBIC AND ANAEROBIC 5CC  Final   Culture  Setup Time   Final    GRAM NEGATIVE RODS IN BOTH AEROBIC AND ANAEROBIC BOTTLES CRITICAL RESULT CALLED TO, READ BACK BY AND VERIFIED WITH: T DAVIS,RN AT 0846 05/04/15 BY L BENFIELD    Culture ESCHERICHIA COLI  Final   Report Status PENDING  Incomplete  Blood Culture (routine x 2)     Status: None (Preliminary result)   Collection Time: 05/03/15  7:00 PM  Result Value Ref Range Status   Specimen Description BLOOD LEFT HAND  Final   Special Requests   Final    BOTTLES DRAWN AEROBIC AND ANAEROBIC 10CC PATIENT ON FOLLOWING VANC AND ZOSEYN   Culture PENDING  Incomplete   Report Status PENDING  Incomplete  Urine culture     Status: None   Collection Time: 05/03/15  8:08 PM  Result Value Ref Range Status   Specimen Description URINE, CLEAN CATCH  Final   Special Requests NONE  Final   Culture >=100,000 COLONIES/mL ESCHERICHIA COLI  Final   Report Status 05/05/2015 FINAL  Final   Organism ID, Bacteria ESCHERICHIA COLI  Final      Susceptibility   Escherichia coli - MIC*    AMPICILLIN <=2 SENSITIVE Sensitive     CEFAZOLIN <=4 SENSITIVE Sensitive     CEFTRIAXONE <=1 SENSITIVE Sensitive     CIPROFLOXACIN <=0.25 SENSITIVE Sensitive     GENTAMICIN <=1 SENSITIVE Sensitive     IMIPENEM <=0.25 SENSITIVE Sensitive      NITROFURANTOIN <=16 SENSITIVE Sensitive     TRIMETH/SULFA <=20 SENSITIVE Sensitive     AMPICILLIN/SULBACTAM <=2 SENSITIVE Sensitive     PIP/TAZO <=4 SENSITIVE Sensitive     * >=100,000 COLONIES/mL ESCHERICHIA COLI  MRSA PCR Screening     Status: None   Collection Time: 05/04/15  7:00 AM  Result Value Ref Range Status   MRSA by PCR NEGATIVE NEGATIVE Final    Comment:        The GeneXpert MRSA Assay (FDA approved for  NASAL specimens only), is one component of a comprehensive MRSA colonization surveillance program. It is not intended to diagnose MRSA infection nor to guide or monitor treatment for MRSA infections.      Studies:   Recent x-ray studies have been reviewed in detail by the Attending Physician  Scheduled Meds:  Scheduled Meds: . cefTRIAXone (ROCEPHIN)  IV  2 g Intravenous Q24H  . enoxaparin (LOVENOX) injection  40 mg Subcutaneous Daily  . lactobacillus  1 g Oral TID WC  . potassium chloride  40 mEq Oral BID    Time spent on care of this patient: 35 mins   MCCLUNG,JEFFREY T , MD   [facility] Office  [phone] Pager - Text Page per Loretha Stapler as per below:  On-Call/Text Page:      [email or web address]      password [email or web address]  If 7PM-7AM, please contact night-coverage [email or web address] Password [email or web address] 05/05/2015, 11:19 AM   LOS: 2 days

## 2015-05-06 ENCOUNTER — Inpatient Hospital Stay (HOSPITAL_COMMUNITY): Payer: Medicaid Other

## 2015-05-06 ENCOUNTER — Ambulatory Visit (HOSPITAL_COMMUNITY): Payer: Medicaid Other

## 2015-05-06 ENCOUNTER — Encounter (HOSPITAL_COMMUNITY): Payer: Self-pay | Admitting: Radiology

## 2015-05-06 DIAGNOSIS — J9601 Acute respiratory failure with hypoxia: Secondary | ICD-10-CM | POA: Diagnosis present

## 2015-05-06 DIAGNOSIS — D649 Anemia, unspecified: Secondary | ICD-10-CM

## 2015-05-06 DIAGNOSIS — N179 Acute kidney failure, unspecified: Secondary | ICD-10-CM | POA: Diagnosis present

## 2015-05-06 DIAGNOSIS — N10 Acute tubulo-interstitial nephritis: Secondary | ICD-10-CM | POA: Diagnosis present

## 2015-05-06 DIAGNOSIS — R509 Fever, unspecified: Secondary | ICD-10-CM

## 2015-05-06 DIAGNOSIS — R7881 Bacteremia: Secondary | ICD-10-CM | POA: Diagnosis present

## 2015-05-06 DIAGNOSIS — R6521 Severe sepsis with septic shock: Secondary | ICD-10-CM

## 2015-05-06 DIAGNOSIS — A419 Sepsis, unspecified organism: Principal | ICD-10-CM

## 2015-05-06 DIAGNOSIS — E872 Acidosis, unspecified: Secondary | ICD-10-CM | POA: Diagnosis present

## 2015-05-06 DIAGNOSIS — O23599 Infection of other part of genital tract in pregnancy, unspecified trimester: Secondary | ICD-10-CM | POA: Diagnosis present

## 2015-05-06 DIAGNOSIS — I428 Other cardiomyopathies: Secondary | ICD-10-CM

## 2015-05-06 DIAGNOSIS — R6520 Severe sepsis without septic shock: Secondary | ICD-10-CM | POA: Diagnosis present

## 2015-05-06 DIAGNOSIS — E876 Hypokalemia: Secondary | ICD-10-CM | POA: Diagnosis present

## 2015-05-06 DIAGNOSIS — O8612 Endometritis following delivery: Secondary | ICD-10-CM

## 2015-05-06 DIAGNOSIS — B9620 Unspecified Escherichia coli [E. coli] as the cause of diseases classified elsewhere: Secondary | ICD-10-CM | POA: Diagnosis present

## 2015-05-06 DIAGNOSIS — R000 Tachycardia, unspecified: Secondary | ICD-10-CM | POA: Diagnosis present

## 2015-05-06 LAB — POCT I-STAT 3, ART BLOOD GAS (G3+)
Acid-base deficit: 11 mmol/L — ABNORMAL HIGH (ref 0.0–2.0)
Bicarbonate: 14.8 mEq/L — ABNORMAL LOW (ref 20.0–24.0)
O2 Saturation: 86 %
PH ART: 7.25 — AB (ref 7.350–7.450)
TCO2: 16 mmol/L (ref 0–100)
pCO2 arterial: 33.7 mmHg — ABNORMAL LOW (ref 35.0–45.0)
pO2, Arterial: 58 mmHg — ABNORMAL LOW (ref 80.0–100.0)

## 2015-05-06 LAB — CBC
HEMATOCRIT: 24.6 % — AB (ref 36.0–46.0)
Hemoglobin: 8 g/dL — ABNORMAL LOW (ref 12.0–15.0)
MCH: 25 pg — ABNORMAL LOW (ref 26.0–34.0)
MCHC: 32.5 g/dL (ref 30.0–36.0)
MCV: 76.9 fL — ABNORMAL LOW (ref 78.0–100.0)
PLATELETS: 128 10*3/uL — AB (ref 150–400)
RBC: 3.2 MIL/uL — ABNORMAL LOW (ref 3.87–5.11)
RDW: 16.4 % — AB (ref 11.5–15.5)
WBC: 3.8 10*3/uL — AB (ref 4.0–10.5)

## 2015-05-06 LAB — BLOOD GAS, ARTERIAL
Acid-base deficit: 7.8 mmol/L — ABNORMAL HIGH (ref 0.0–2.0)
Bicarbonate: 15.9 mEq/L — ABNORMAL LOW (ref 20.0–24.0)
DRAWN BY: 276051
O2 Content: 4 L/min
O2 Saturation: 88 %
PCO2 ART: 28.1 mmHg — AB (ref 35.0–45.0)
PH ART: 7.382 (ref 7.350–7.450)
Patient temperature: 102.7
TCO2: 16.7 mmol/L (ref 0–100)
pO2, Arterial: 67.2 mmHg — ABNORMAL LOW (ref 80.0–100.0)

## 2015-05-06 LAB — COMPREHENSIVE METABOLIC PANEL
ALT: 14 U/L (ref 14–54)
AST: 24 U/L (ref 15–41)
Albumin: 1.6 g/dL — ABNORMAL LOW (ref 3.5–5.0)
Alkaline Phosphatase: 136 U/L — ABNORMAL HIGH (ref 38–126)
Anion gap: 6 (ref 5–15)
BILIRUBIN TOTAL: 2 mg/dL — AB (ref 0.3–1.2)
BUN: 7 mg/dL (ref 6–20)
CHLORIDE: 113 mmol/L — AB (ref 101–111)
CO2: 17 mmol/L — ABNORMAL LOW (ref 22–32)
CREATININE: 1.18 mg/dL — AB (ref 0.44–1.00)
Calcium: 7.8 mg/dL — ABNORMAL LOW (ref 8.9–10.3)
Glucose, Bld: 107 mg/dL — ABNORMAL HIGH (ref 65–99)
Potassium: 3.9 mmol/L (ref 3.5–5.1)
Sodium: 136 mmol/L (ref 135–145)
TOTAL PROTEIN: 4.7 g/dL — AB (ref 6.5–8.1)

## 2015-05-06 LAB — PROCALCITONIN: PROCALCITONIN: 36.86 ng/mL

## 2015-05-06 LAB — CULTURE, BLOOD (ROUTINE X 2)

## 2015-05-06 LAB — TECHNOLOGIST SMEAR REVIEW

## 2015-05-06 LAB — GLUCOSE, CAPILLARY: GLUCOSE-CAPILLARY: 110 mg/dL — AB (ref 65–99)

## 2015-05-06 LAB — T4, FREE: FREE T4: 1.26 ng/dL — AB (ref 0.61–1.12)

## 2015-05-06 LAB — TROPONIN I
TROPONIN I: 0.09 ng/mL — AB (ref <0.031)
Troponin I: 0.04 ng/mL — ABNORMAL HIGH (ref <0.031)

## 2015-05-06 LAB — PROTIME-INR
INR: 1.45 (ref 0.00–1.49)
Prothrombin Time: 17.7 seconds — ABNORMAL HIGH (ref 11.6–15.2)

## 2015-05-06 LAB — LACTIC ACID, PLASMA
LACTIC ACID, VENOUS: 1.3 mmol/L (ref 0.5–2.0)
LACTIC ACID, VENOUS: 1.2 mmol/L (ref 0.5–2.0)

## 2015-05-06 LAB — APTT: aPTT: 23 seconds — ABNORMAL LOW (ref 24–37)

## 2015-05-06 MED ORDER — IOHEXOL 350 MG/ML SOLN
100.0000 mL | Freq: Once | INTRAVENOUS | Status: AC | PRN
  Administered 2015-05-06: 100 mL via INTRAVENOUS

## 2015-05-06 MED ORDER — FENTANYL CITRATE (PF) 100 MCG/2ML IJ SOLN
100.0000 ug | Freq: Once | INTRAMUSCULAR | Status: AC
  Administered 2015-05-06: 100 ug via INTRAVENOUS

## 2015-05-06 MED ORDER — MIDAZOLAM HCL 2 MG/2ML IJ SOLN
1.0000 mg | INTRAMUSCULAR | Status: DC | PRN
Start: 2015-05-06 — End: 2015-05-06

## 2015-05-06 MED ORDER — MIDAZOLAM HCL 2 MG/2ML IJ SOLN
INTRAMUSCULAR | Status: AC
Start: 2015-05-06 — End: 2015-05-07
  Filled 2015-05-06: qty 4

## 2015-05-06 MED ORDER — PANTOPRAZOLE SODIUM 40 MG IV SOLR
40.0000 mg | Freq: Every day | INTRAVENOUS | Status: DC
  Administered 2015-05-06 – 2015-05-08 (×3): 40 mg via INTRAVENOUS
  Filled 2015-05-06 (×3): qty 40

## 2015-05-06 MED ORDER — MIDAZOLAM BOLUS VIA INFUSION
1.0000 mg | INTRAVENOUS | Status: DC | PRN
  Filled 2015-05-06: qty 2

## 2015-05-06 MED ORDER — TECHNETIUM TO 99M ALBUMIN AGGREGATED
3.0000 | Freq: Once | INTRAVENOUS | Status: AC | PRN
  Administered 2015-05-06: 3 via INTRAVENOUS

## 2015-05-06 MED ORDER — CHLORHEXIDINE GLUCONATE 0.12% ORAL RINSE (MEDLINE KIT)
15.0000 mL | Freq: Two times a day (BID) | OROMUCOSAL | Status: DC
  Administered 2015-05-06 – 2015-05-08 (×3): 15 mL via OROMUCOSAL

## 2015-05-06 MED ORDER — FENTANYL CITRATE (PF) 100 MCG/2ML IJ SOLN
INTRAMUSCULAR | Status: AC
  Filled 2015-05-06: qty 4

## 2015-05-06 MED ORDER — LORAZEPAM 2 MG/ML IJ SOLN
INTRAMUSCULAR | Status: AC
  Filled 2015-05-06: qty 1

## 2015-05-06 MED ORDER — LORAZEPAM 2 MG/ML IJ SOLN
2.0000 mg | Freq: Once | INTRAMUSCULAR | Status: AC
  Administered 2015-05-06: 2 mg via INTRAVENOUS

## 2015-05-06 MED ORDER — MIDAZOLAM HCL 2 MG/2ML IJ SOLN: 2.0000 mg | INTRAMUSCULAR | Status: DC | PRN

## 2015-05-06 MED ORDER — MIDAZOLAM HCL 5 MG/ML IJ SOLN
0.0000 mg/h | INTRAMUSCULAR | Status: DC
  Administered 2015-05-06 – 2015-05-07 (×2): 2 mg/h via INTRAVENOUS
  Filled 2015-05-06 (×2): qty 10

## 2015-05-06 MED ORDER — SODIUM BICARBONATE 8.4 % IV SOLN
INTRAVENOUS | Status: DC
  Administered 2015-05-07: 02:00:00 via INTRAVENOUS
  Filled 2015-05-06 (×5): qty 150

## 2015-05-06 MED ORDER — SODIUM CHLORIDE 0.9 % IV SOLN
25.0000 ug/h | INTRAVENOUS | Status: DC
  Administered 2015-05-06: 50 ug/h via INTRAVENOUS
  Filled 2015-05-06: qty 50

## 2015-05-06 MED ORDER — VANCOMYCIN HCL IN DEXTROSE 750-5 MG/150ML-% IV SOLN
750.0000 mg | Freq: Two times a day (BID) | INTRAVENOUS | Status: DC
  Filled 2015-05-06 (×2): qty 150

## 2015-05-06 MED ORDER — DEXTROSE 5 % IV SOLN
30.0000 ug/min | INTRAVENOUS | Status: DC
  Administered 2015-05-06: 10 ug/min via INTRAVENOUS
  Filled 2015-05-06: qty 1

## 2015-05-06 MED ORDER — ETOMIDATE 2 MG/ML IV SOLN
20.0000 mg | Freq: Once | INTRAVENOUS | Status: AC
  Administered 2015-05-06: 20 mg via INTRAVENOUS

## 2015-05-06 MED ORDER — PIPERACILLIN-TAZOBACTAM 3.375 G IVPB
3.3750 g | Freq: Three times a day (TID) | INTRAVENOUS | Status: DC
  Administered 2015-05-06 – 2015-05-07 (×3): 3.375 g via INTRAVENOUS
  Filled 2015-05-06 (×5): qty 50

## 2015-05-06 MED ORDER — METOPROLOL TARTRATE 1 MG/ML IV SOLN
2.5000 mg | INTRAVENOUS | Status: DC | PRN
  Administered 2015-05-07: 2.5 mg via INTRAVENOUS
  Filled 2015-05-06: qty 5

## 2015-05-06 MED ORDER — MIDAZOLAM HCL 2 MG/2ML IJ SOLN
INTRAMUSCULAR | Status: AC
  Filled 2015-05-06: qty 4

## 2015-05-06 MED ORDER — ANTISEPTIC ORAL RINSE SOLUTION (CORINZ)
7.0000 mL | Freq: Four times a day (QID) | OROMUCOSAL | Status: DC
  Administered 2015-05-06 – 2015-05-08 (×7): 7 mL via OROMUCOSAL

## 2015-05-06 MED ORDER — FENTANYL CITRATE (PF) 100 MCG/2ML IJ SOLN: 50.0000 ug | Freq: Once | INTRAMUSCULAR | Status: DC

## 2015-05-06 MED ORDER — WHITE PETROLATUM GEL
Status: AC
Start: 2015-05-06 — End: 2015-05-07
  Filled 2015-05-06: qty 1

## 2015-05-06 MED ORDER — MIDAZOLAM HCL 2 MG/2ML IJ SOLN
4.0000 mg | Freq: Once | INTRAMUSCULAR | Status: AC
  Administered 2015-05-06: 4 mg via INTRAVENOUS

## 2015-05-06 MED ORDER — FENTANYL BOLUS VIA INFUSION
50.0000 ug | INTRAVENOUS | Status: DC | PRN
  Filled 2015-05-06: qty 50

## 2015-05-06 MED ORDER — VANCOMYCIN HCL IN DEXTROSE 750-5 MG/150ML-% IV SOLN
750.0000 mg | Freq: Two times a day (BID) | INTRAVENOUS | Status: DC
  Administered 2015-05-06 – 2015-05-07 (×2): 750 mg via INTRAVENOUS
  Filled 2015-05-06 (×3): qty 150

## 2015-05-06 MED ORDER — ROCURONIUM BROMIDE 50 MG/5ML IV SOLN
50.0000 mg | Freq: Once | INTRAVENOUS | Status: AC
  Administered 2015-05-06: 50 mg via INTRAVENOUS

## 2015-05-06 NOTE — Progress Notes (Signed)
[name] TEAM 1 - Stepdown/ICU TEAM Progress Note  Deanna Sullivan XOV:[id number] DOB: 05/23/1996 DOA: 05/03/2015 PCP: No primary care provider on file.  Admit HPI / Brief Narrative: 19 year old BF PMHx none, Two weeks postpartum from vaginal delivery which was complicated by postpartum hemorrhage requiring prbc Transfusion and postpartum Endometritis required dilation and evacuation secondary to retained products of conception 7 days postpartum and abx. She was brought to the ER by EMS due to right sided ab pain, back pain, she was found to be hypotensive, sinus tachycardia, leukocytosis, lactic acidosis, CT ab showed +right sided pyelo, she is given vanc/zosyn and hospitalist called to admit the patient.  Currently patient reported feeling a little better, she reported not recent vaginal discharge or bleed, but did developed n/v and diarrhea today.    HPI/Subjective: 9/20 A/O 4, NAD. Patient states whenever she stands becomes lightheaded. Negative CP, negative SOB, negative DOE. NOTE; was paged to bedside by RN Kriste Basque 2dary to patient having approximately 24 hours of tachypnea, tachycardia, low BP unknown cause. MAXIMUM TEMPERATURE overnight 39.3. Patient stated she does have an Aunt having DVTs unknown cause and unknown cancer. Will call her Aunt this evening to obtain more information   Assessment/Plan: Severe sepsis with right sided E coli pyelonephritis and E coli bacteremia  -Patient on appropriate antibiotics but continues to have fever. Negative leukocytosis  -Continue ceftriaxone  -Panculture, obtain PCXR  -Obtain VQ scan R/O PE  -LE Doppler pending   Fever/chills -See severe sepsis - In patient with increasing episodes of fever, continued no BP, tachypnea, tachycardia  -Pain products of conception? Abscess? PE? Additional retained products of conception? -VQ scan negative for PE  -Consult OB/GYN  -Will try to avoid contrast if possible secondary to acute renal failure. NOTE  ; patient patient's bedside upon return from VQ scan patient in respiratory distress with positive CP, tachypnea, tachycardia, hypotensive -Patient treated with morphine 2 mg which resolved CP however patient began to become agitated and hallucinate, treated with Ativan 2 mg but had paradoxical reaction. -Rapid Response was paged to bedside which obtained ABG. PH not consistent with PCO2 and HCO3, however verified was arterial.  -Patient with continued agitation and hypotensive contacted PCCM to request patient be sedated and ventilated.    Diarrhea  -Resolved    Acute renal failure -Cr max= 1.33  -Cr trending down currently 1.18  -Avoid nephrotoxic medication  Hypokalemia  -Resolved continue monitor closely    Severe hypomagnesemia Replace via IV and f/u later today   Sinus tachycardia  -See severe sepsis  -Most c/w sepsis due to bacteremia, but given her recent difficult delivery she would be at increased risk of DVT/PE; negative PE, LE Doppler pending  Recent endometritis/Retained products of conception after delivery with complications -As above - may require repeat GYN eval if infxn sx do not improve Addendum; patient now under the care PCCM. Recommend evaluation for additional retained products of conception given patient's worsening status.   Normocytic anemia in setting of recent post-delivery uterine bleeding  -Transfuse if Hgb drops below 7.0, or if tachycardia persists despite ongoing volume expansion     Code Status: FULL Family Communication: no family present at time of exam Disposition Plan:     Consultants: Clemencia Course [facility] M).   Procedure/Significant Events: 9/6 Dilation and Evacuation; endometritis and retained products of conception 7 days postpartum 9/17 CT abdomen pelvis with contrast; Rt pyelonephritis. Negative renal abscess. -Periportal edema and diffuse gallbladder wall thickening. Diffuse haziness mesentery and retroperitoneum, and  small volume perihepatic ascites. Aggressive hydration vs third-spacing vs reactive from the right pyelonephritis.    Culture   Antibiotics: Zosyn 9/17 > stopped 9/18 Vanc 9/17 > stopped9/18 Rocephin 9/18 >   DVT prophylaxis: lovenox    Devices   LINES / TUBES:      Continuous Infusions: . fentaNYL infusion INTRAVENOUS 50 mcg/hr (05/06/15 1330)  . phenylephrine (NEO-SYNEPHRINE) Adult infusion    .  sodium bicarbonate  infusion 1000 mL 100 mL/hr at 05/06/15 1508    Objective: VITAL SIGNS: Temp: 102.7 F (39.3 C) (09/20 1100) Temp Source: Oral (09/20 1100) BP: 100/32 mmHg (09/20 1331) Pulse Rate: 139 (09/20 1331) SPO2; FIO2:   Intake/Output Summary (Last 24 hours) at 05/06/15 1521 Last data filed at 05/06/15 0830  Gross per 24 hour  Intake 2484.67 ml  Output    800 ml  Net 1684.67 ml     Exam: General: A/O 4, dizziness with standing, No acute respiratory distress Eyes: Negative headache, eye pain, double vision,negative scleral hemorrhage ENT: Negative Runny nose, negative ear pain, negative gingival bleeding, Neck:  Negative scars, masses, torticollis, lymphadenopathy, JVD Lungs: Tachypnea, Clear to auscultation bilaterally without wheezes or crackles Cardiovascular: Tachycardic Regular rhythm without murmur gallop or rub normal S1 and S2 Abdomen:negative abdominal pain, nondistended, positive soft, bowel sounds, no rebound, no ascites, no appreciable mass Extremities: No significant cyanosis, clubbing, or edema bilateral lower extremities Psychiatric:  Negative depression, negative anxiety, negative fatigue, negative mania Neurologic:  Cranial nerves II through XII intact, tongue/uvula midline, all extremities muscle strength 5/5, sensation intact throughout, negative dysarthria, negative expressive aphasia, negative receptive aphasia. Addendum Paged to bedside. Patient respiratory and cardiac distress. Having chest pain/back pain. HR 17-199, BP  100/50, RR 40-50  Data Reviewed: Basic Metabolic Panel:  Recent Labs Lab 05/03/15 1820 05/04/15 0600 05/04/15 1900 05/05/15 0225 05/05/15 1445 05/06/15 0310  NA 130* 139  --  138 139 136  K 3.2* 2.9*  --  2.9* 3.5 3.9  CL 100* 112*  --  114* 114* 113*  CO2 18* 18*  --  15* 18* 17*  GLUCOSE 142* 107*  --  104* 92 107*  BUN 23* 16  --  CREATININE 1.49* 1.33*  --  1.32* 1.16* 1.18*  CALCIUM 7.6* 7.0*  --  6.8* 7.6* 7.8*  MG  --   --  1.1*  --  2.7*  --    Liver Function Tests:  Recent Labs Lab 05/03/15 1820 05/04/15 0600 05/05/15 0225 05/06/15 0310  AST ALT 12* 14 12* 14  ALKPHOS 140* 149* 123 136*  BILITOT 1.4* 2.3* 2.0* 2.0*  PROT 6.0* 5.2* 4.5* 4.7*  ALBUMIN 2.5* 1.9* 1.7* 1.6*    Recent Labs Lab 05/03/15 1820  LIPASE 14*   No results for input(s): AMMONIA in the last 168 hours. CBC:  Recent Labs Lab 05/03/15 1820 05/04/15 0600 05/05/15 0225 05/06/15 0310  WBC 17.3* 11.6* 5.9 3.8*  NEUTROABS 15.5*  --   --   --   HGB 8.9* 8.8* 7.9* 8.0*  HCT 27.8* 26.4* 24.4* 24.6*  MCV 80.3 79.5 77.7* 76.9*  PLT 275 206 198 128*   Cardiac Enzymes: No results for input(s): CKTOTAL, CKMB, CKMBINDEX, TROPONINI in the last 168 hours. BNP (last 3 results) No results for input(s): BNP in the last 8760 hours.  ProBNP (last 3 results) No results for input(s): PROBNP in the last 8760 hours.  CBG: No results for input(s): GLUCAP in the  last 168 hours.  Recent Results (from the past 240 hour(s))  Blood Culture (routine x 2)     Status: None   Collection Time: 05/03/15  6:15 PM  Result Value Ref Range Status   Specimen Description BLOOD LEFT FOREARM  Final   Special Requests BOTTLES DRAWN AEROBIC AND ANAEROBIC 5CC  Final   Culture  Setup Time   Final    GRAM NEGATIVE RODS IN BOTH AEROBIC AND ANAEROBIC BOTTLES CRITICAL RESULT CALLED TO, READ BACK BY AND VERIFIED WITH: T DAVIS,RN AT 0846 05/04/15 BY L BENFIELD    Culture ESCHERICHIA COLI  Final     Report Status 05/06/2015 FINAL  Final   Organism ID, Bacteria ESCHERICHIA COLI  Final      Susceptibility   Escherichia coli - MIC*    AMPICILLIN <=2 SENSITIVE Sensitive     CEFAZOLIN <=4 SENSITIVE Sensitive     CEFEPIME <=1 SENSITIVE Sensitive     CEFTAZIDIME <=1 SENSITIVE Sensitive     CEFTRIAXONE <=1 SENSITIVE Sensitive     CIPROFLOXACIN <=0.25 SENSITIVE Sensitive     GENTAMICIN <=1 SENSITIVE Sensitive     IMIPENEM <=0.25 SENSITIVE Sensitive     TRIMETH/SULFA <=20 SENSITIVE Sensitive     AMPICILLIN/SULBACTAM <=2 SENSITIVE Sensitive     PIP/TAZO <=4 SENSITIVE Sensitive     * ESCHERICHIA COLI  Blood Culture (routine x 2)     Status: None (Preliminary result)   Collection Time: 05/03/15  7:00 PM  Result Value Ref Range Status   Specimen Description BLOOD LEFT HAND  Final   Special Requests   Final    BOTTLES DRAWN AEROBIC AND ANAEROBIC 10CC POF VANC AND ZOSEYN   Culture NO GROWTH 3 DAYS  Final   Report Status PENDING  Incomplete  Urine culture     Status: None   Collection Time: 05/03/15  8:08 PM  Result Value Ref Range Status   Specimen Description URINE, CLEAN CATCH  Final   Special Requests NONE  Final   Culture >=100,000 COLONIES/mL ESCHERICHIA COLI  Final   Report Status 05/05/2015 FINAL  Final   Organism ID, Bacteria ESCHERICHIA COLI  Final      Susceptibility   Escherichia coli - MIC*    AMPICILLIN <=2 SENSITIVE Sensitive     CEFAZOLIN <=4 SENSITIVE Sensitive     CEFTRIAXONE <=1 SENSITIVE Sensitive     CIPROFLOXACIN <=0.25 SENSITIVE Sensitive     GENTAMICIN <=1 SENSITIVE Sensitive     IMIPENEM <=0.25 SENSITIVE Sensitive     NITROFURANTOIN <=16 SENSITIVE Sensitive     TRIMETH/SULFA <=20 SENSITIVE Sensitive     AMPICILLIN/SULBACTAM <=2 SENSITIVE Sensitive     PIP/TAZO <=4 SENSITIVE Sensitive     * >=100,000 COLONIES/mL ESCHERICHIA COLI  MRSA PCR Screening     Status: None   Collection Time: 05/04/15  7:00 AM  Result Value Ref Range Status   MRSA by PCR NEGATIVE  NEGATIVE Final    Comment:        The GeneXpert MRSA Assay (FDA approved for NASAL specimens only), is one component of a comprehensive MRSA colonization surveillance program. It is not intended to diagnose MRSA infection nor to guide or monitor treatment for MRSA infections.   Culture, blood (routine x 2)     Status: None (Preliminary result)   Collection Time: 05/06/15  9:51 AM  Result Value Ref Range Status   Specimen Description BLOOD RIGHT ANTECUBITAL  Final   Special Requests BOTTLES DRAWN AEROBIC AND ANAEROBIC 5CC  Final  Culture PENDING  Incomplete   Report Status PENDING  Incomplete     Studies:  Recent x-ray studies have been reviewed in detail by the Attending Physician  Scheduled Meds:  Scheduled Meds: . enoxaparin (LOVENOX) injection  40 mg Subcutaneous Daily  . fentaNYL      . fentaNYL      . fentaNYL (SUBLIMAZE) injection  50 mcg Intravenous Once  . lactobacillus  1 g Oral TID WC  . midazolam      . midazolam      . pantoprazole (PROTONIX) IV  40 mg Intravenous Daily  . piperacillin-tazobactam (ZOSYN)  IV  3.375 g Intravenous 3 times per day  . vancomycin  750 mg Intravenous Q12H  . white petrolatum        Time spent on care of this patient: 40 mins   WOODS, Roselind Messier , MD  [facility] Office  [phone] Pager - [phone]  On-Call/Text Page:      [email or web address]      password [email or web address]  If 7PM-7AM, please contact night-coverage [email or web address] Password [email or web address] 05/06/2015, 3:21 PM   LOS: 3 days   Care during the described time interval was provided by me .  I have reviewed this patient's available data, including medical history, events of note, physical examination, and all test results as part of my evaluation. I have personally reviewed and interpreted all radiology studies.   Carolyne Littles, MD [phone] Pager

## 2015-05-06 NOTE — Progress Notes (Signed)
Patient transported to CT and back to room 2M06 without any complications. 

## 2015-05-06 NOTE — Progress Notes (Signed)
Critical care consulted, pt increasingly agitated and confused. Awaiting arrival of MD at bedside.

## 2015-05-06 NOTE — Progress Notes (Signed)
Pt HR sustained 140's-150', RR 30-40's PRN metoprolol given. MD Joseph Art paged. Pt placed on nonrebreather, rapid response notified. Awaiting orders.

## 2015-05-06 NOTE — Progress Notes (Signed)
  Echocardiogram 2D Echocardiogram has been performed.  Deanna Sullivan 05/06/2015, 1:06 PM

## 2015-05-06 NOTE — Procedures (Signed)
Intubation Procedure Note Deanna Sullivan [id number] [birth year]  Procedure: Intubation Indications: Respiratory insufficiency  Procedure Details Consent: Unable to obtain consent because of emergent medical necessity. Time Out: Verified patient identification, verified procedure, site/side was marked, verified correct patient position, special equipment/implants available, medications/allergies/relevent history reviewed, required imaging and test results available.  Performed  Drugs:  50 mcg Fentanyl, 1 mg Versed, 10 mg Etomidate, 30 mg Rocuronium. IDL using glidescope x 1 with # 3 blade. Grade 1 view. 7.5 tube visualized passing through vocal cords. Following intubation:  positive color change on ETCO2, condensation seen in endotracheal tube, equal breath sounds bilaterally.  Evaluation Hemodynamic Status: BP stable throughout; O2 sats: transiently fell during during procedure Patient's Current Condition: stable Complications: No apparent complications Patient did tolerate procedure well. Chest X-ray ordered to verify placement.  CXR: pending.   Rutherford Guys, [place] [facility] [place] [facility] Pager: [phone]  or [phone] 05/06/2015, 1:23 PM  I was present and supervised the entire procedure.  Alyson Reedy, M.D. [facility] Pulmonary/Critical Care Medicine. Pager: [phone]. After hours pager: [phone].

## 2015-05-06 NOTE — Consult Note (Signed)
ANTIBIOTIC CONSULT NOTE - INITIAL  Pharmacy Consult for vancomycin, zosyn Indication: rule out sepsis  No Known Allergies  Patient Measurements: Height: 5\' 3"  (160 cm) Weight: 128 lb 8.5 oz (58.3 kg) IBW/kg (Calculated) : 52.4  Vital Signs: Temp: 102.7 F (39.3 C) (09/20 1100) Temp Source: Oral (09/20 1100) BP: 100/32 mmHg (09/20 1331) Pulse Rate: 139 (09/20 1331) Intake/Output from previous day: 09/19 0701 - 09/20 0700 In: 2654.7 [P.O.:600; I.V.:1404.7; IV Piggyback:650] Out: 800 [Urine:800] Intake/Output from this shift: Total I/O In: 120 [P.O.:120] Out: -   Labs:  Recent Labs  05/04/15 0600 05/05/15 0225 05/05/15 1445 05/06/15 0310  WBC 11.6* 5.9  --  3.8*  HGB 8.8* 7.9*  --  8.0*  PLT 206 198  --  128*  CREATININE 1.33* 1.32* 1.16* 1.18*   Estimated Creatinine Clearance: 63.4 mL/min (by C-G formula based on Cr of 1.18). No results for input(s): VANCOTROUGH, VANCOPEAK, VANCORANDOM, GENTTROUGH, GENTPEAK, GENTRANDOM, TOBRATROUGH, TOBRAPEAK, TOBRARND, AMIKACINPEAK, AMIKACINTROU, AMIKACIN in the last 72 hours.   Medical History: Past Medical History  Diagnosis Date  . Anemia    Assessment: Assessment: 19yo female with history of anemia presents with abdominal pain. Pharmacy is consulted to dose vancomycin and zosyn for suspected sepsis. Pt is afebrile, WBC 17.3, sCr 1.49, PCT 9.78, LA 2.05>1.05.  Pt received vancomycin 1g and zosyn 3.375g once in the ED.  Pt postpartum from vaginal delivery x 2 weeks which required transfusion 2/2 pospartum hemorrhage.   ID: Suspected sepsis.  WBC 13.8 (was as high as 17.3), Tmax 102.7  Vanc 9/18>>9/18, 9/20 Zosyn 9/18>>9/18, 9/20 Ceftriaxone 9/18>>9/20  9/17 UC >100K ecoli 9/17 BC GNR - x1 has E. Coli MRSA PCR Neg  Nephro: Scr 1.18  Goal of Therapy:  Vancomycin trough level 15-20 mcg/ml  Plan:  - Zosyn 3.375 gm IV q8h - Vancomycin 750 mg q12h (no load since recent dosing) - Follow up culture results, renal  function, clinical course, and VT prn  Isaac Bliss, PharmD, BCPS Clinical Pharmacist Pager [phone] 05/06/2015 3:08 PM

## 2015-05-06 NOTE — Progress Notes (Signed)
Critical care at bedside, one time dose of ativan give, orders given for transfer to ICU.

## 2015-05-06 NOTE — Progress Notes (Signed)
eLink Physician-Brief Progress Note Patient Name: Deanna Sullivan DOB: 1996/01/18 MRN: [id number]   Date of Service  05/06/2015  HPI/Events of Note  Called to radiology to order appropriate CTA to R/O aortic dissection.   eICU Interventions  Appropriate studies ordered.      Intervention Category Minor Interventions: Routine modifications to care plan (e.g. PRN medications for pain, fever)  Sommer,Steven Eugene 05/06/2015, 4:53 PM

## 2015-05-06 NOTE — Consult Note (Signed)
PULMONARY / CRITICAL CARE MEDICINE   Name: Deanna Sullivan MRN: [id number] DOB: 09/15/1995    ADMISSION DATE:  05/03/2015 CONSULTATION DATE:  05/06/15  REFERRING MD :  Joseph Art  CHIEF COMPLAINT:  Respiratory Distress  INITIAL PRESENTATION:  19 y.o. F who is two weeks postpartum from spontaneous vaginal delivery (04/15/15), brought to [facility] ED 05/03/15 with back and abd pain.  She was found to have sepsis due to pyelonephritis.  She was admitted to SDU by [facility].  On 9/19, she had back pain and chest pain followed by confusion and spike in fever.  She was transferred to the ICU and was intubated for respiratory insufficiency.   STUDIES:  CT A / P 9/17 >>> right pyelo, no renal abscess.  Periportal edema and diffuse GB wall thickening. Small volume perihepatic ascites.  CXR 9/20 >>> small effusions. VQ Scan 9/20 >>> low prob for PE. LE duplex 9/20 >>> negative for DVT.   SIGNIFICANT EVENTS: 8/30 - spontaneous vaginal delivery. 9/17 - admitted with sepsis due to pyelo. 9/20 - chest pain, back pain, respiratory insufficiency.  Transferred to ICU and intubated, CVL placed.  HISTORY OF PRESENT ILLNESS:  Pt is encephalopathic; therefore, this HPI is obtained from chart review. Deanna Sullivan is a 19 y.o. F with PMH of anemia as well as recent vaginal delivery (delivery was 04/15/15) which was complicated by post partum hemorrhage requiring PRBC transfusion as well as endometritis requiring D&C and abx.  She was brought to [facility] ED 05/03/15 due to right sided back pain and abdominal pain.  She was found to have sepsis due to right sided pyelonephritis.  Blood and urine cultures from admit returned positive for E.Coli.  On 9/20, pt complained of back pain and then chest pain.  She then became tachycardic (sinus tach with rates as high as 180's) and had intermittent hypotension (lowest SBP 80).  She received morphine for chest pain and shortly thereafter she became confused and disoriented.  In addition, her  core temperature had spiked from 102F to 105F.  PCCM was called and pt was transferred to the ICU where she was subsequently intubated for respiratory insufficiency.  PAST MEDICAL HISTORY :   has a past medical history of Anemia.  has past surgical history that includes Dilation and evacuation (N/A, 04/22/2015). Prior to Admission medications   Medication Sig Start Date End Date Taking? Authorizing Provider  etonogestrel (NEXPLANON) 68 MG IMPL implant 1 each by Subdermal route once. Implanted 04/16/15   Yes Historical Provider, MD  ferrous sulfate (FERROUSUL) 325 (65 FE) MG tablet Take 1 tablet (325 mg total) by mouth 2 (two) times daily. Patient taking differently: Take 325 mg by mouth daily with breakfast.  04/17/15  Yes Kathrynn Running, MD  ibuprofen (ADVIL,MOTRIN) 600 MG tablet Take 1 tablet (600 mg total) by mouth every 6 (six) hours. Patient not taking: Reported on 05/03/2015 04/17/15   Kathrynn Running, MD  oxyCODONE-acetaminophen (PERCOCET/ROXICET) 5-325 MG per tablet Take 1-2 tablets by mouth every 3 (three) hours as needed for severe pain (moderate to severe pain (when tolerating fluids)). Patient not taking: Reported on 05/03/2015 04/23/15   Adam Phenix, MD  Prenatal Vit-Fe Fumarate-FA (PRENATAL COMPLETE) 14-0.4 MG TABS Take as directed Patient not taking: Reported on 04/21/2015 02/18/15   Willodean Rosenthal, MD   No Known Allergies  FAMILY HISTORY:  Family History  Problem Relation Age of Onset  . Cancer Mother     SOCIAL HISTORY:  reports that she has quit smoking. Her  smoking use included Cigars. She has never used smokeless tobacco. She reports that she does not drink alcohol or use illicit drugs.  REVIEW OF SYSTEMS:  Unable to obtain as pt is encephalopathic.  SUBJECTIVE:   VITAL SIGNS: Temp:  [99.6 F (37.6 C)-102.7 F (39.3 C)] 102.7 F (39.3 C) (09/20 1100) Pulse Rate:  [119-152] 139 (09/20 1331) Resp:  [12-35] 22 (09/20 1331) BP: (88-135)/(32-89) 100/32 mmHg  (09/20 1331) SpO2:  [82 %-99 %] 99 % (09/20 1331) FiO2 (%):  [100 %] 100 % (09/20 1331) HEMODYNAMICS:   VENTILATOR SETTINGS: Vent Mode:  [-]  FiO2 (%):  [100 %] 100 % INTAKE / OUTPUT: Intake/Output      09/19 0701 - 09/20 0700 09/20 0701 - 09/21 0700   P.O. 600 120   I.V. (mL/kg) 1404.7 (24.1)    IV Piggyback 650    Total Intake(mL/kg) 2654.7 (45.5) 120 (2.1)   Urine (mL/kg/hr) 800 (0.6)    Total Output 800     Net +1854.7 +120          PHYSICAL EXAMINATION: General: Young AA female, in distress. Neuro: Awake but anxious, agitated and emotional. MAE's. HEENT: [place]/AT. Sclerae anicteric. Cardiovascular: RRR, no M/R/G.  Lungs: Respirations even and unlabored.  CTA bilaterally, No W/R/R. Abdomen: BS x 4, soft, NT/ND.  Musculoskeletal: No gross deformities, no edema.  Skin: Intact, warm, no rashes.  LABS:  CBC  Recent Labs Lab 05/04/15 0600 05/05/15 0225 05/06/15 0310  WBC 11.6* 5.9 3.8*  HGB 8.8* 7.9* 8.0*  HCT 26.4* 24.4* 24.6*  PLT 206 198 128*   Coag's  Recent Labs Lab 05/03/15 1820  APTT 34  INR 1.66*   BMET  Recent Labs Lab 05/05/15 0225 05/05/15 1445 05/06/15 0310  NA 138 139 136  K 2.9* 3.5 3.9  CL 114* 114* 113*  CO2 15* 18* 17*  BUN 10 7 7   CREATININE 1.32* 1.16* 1.18*  GLUCOSE 104* 92 107*   Electrolytes  Recent Labs Lab 05/04/15 1900 05/05/15 0225 05/05/15 1445 05/06/15 0310  CALCIUM  --  6.8* 7.6* 7.8*  MG 1.1*  --  2.7*  --    Sepsis Markers  Recent Labs Lab 05/03/15 1820  05/03/15 2148 05/04/15 0600 05/06/15 0951  LATICACIDVEN  --   < > 1.05 1.6 1.3  PROCALCITON 9.78  --   --   --   --   < > = values in this interval not displayed. ABG  Recent Labs Lab 05/06/15 0830  PHART 7.382  PCO2ART 28.1*  PO2ART 67.2*   Liver Enzymes  Recent Labs Lab 05/04/15 0600 05/05/15 0225 05/06/15 0310  AST 28 19 24   ALT 14 12* 14  ALKPHOS 149* 123 136*  BILITOT 2.3* 2.0* 2.0*  ALBUMIN 1.9* 1.7* 1.6*   Cardiac  Enzymes No results for input(s): TROPONINI, PROBNP in the last 168 hours. Glucose No results for input(s): GLUCAP in the last 168 hours.  Imaging Dg Chest 2 View  05/06/2015   CLINICAL DATA:  Pneumonia affecting pregnancy. Feeling better today.  EXAM: CHEST  2 VIEW  COMPARISON:  05/03/2015  FINDINGS: Decreasing lung volumes with increasing bibasilar opacities, likely atelectasis. Possible small effusions. Heart size is borderline. No acute bony abnormality.  IMPRESSION: Worsening aeration of the lungs with increasing bibasilar opacities, likely atelectasis.  Suspect small effusions.   Electronically Signed   By: Charlett Nose M.D.   On: 05/06/2015 09:07   N[facility] Pulmonary Perfusion  05/06/2015   CLINICAL DATA:  Recent postpartum  patient was actively breastfeeding with right-sided abdominal and back pain.  EXAM: NUCLEAR MEDICINE VENTILATION AND PERFUSION SCAN  TECHNIQUE: Perfusion images were obtained in multiple projections after intravenous injection of radiopharmaceutical.  RADIOPHARMACEUTICALS:  3 mCi Tc68m MAA IV  COMPARISON:  Chest radiograph 05/06/2015  FINDINGS: Profusion images demonstrate no profusion abnormality. Radiotracer is homogeneously distributed throughout the lungs bilaterally.  IMPRESSION: Very low probability for pulmonary embolism (PE absent).   Electronically Signed   By: Annia Belt M.D.   On: 05/06/2015 11:30     ASSESSMENT / PLAN:  INFECTIOUS A:   Bacteremia - BCx's positive for GNR's with E.Coli speciation. UTI / pyelonephritis - E.coli. P:   BCx2 9/17 >>> GNR'S >>> E.COLI (pan sens). UCx >>> E.COLI (pan sens). Abx: Stop ceftriaxone and broaden as below. Abx: Vanc, start date 9/20, day 1/x. Abx: Zosyn, start date 9/20, day 1/x. PCT algorithm to limit abx exposure.  PULMONARY OETT 9/20 >>> A: VDRF due to inability to protect airway during distress 9/20. P:   Full mechanical support, wean as able. VAP prevention measures. SBT in AM if able. CXR in  AM.  CARDIOVASCULAR CVL L IJ 9/20 >>> A:  R/o dissection / AAA. Sinus tachycardia. Chest pain - EKG non-revealing. ? Post partum cardiomyopathy. P:  STAT echo - r/o dissection / AAA given pain, tachycardia, hypotension and to assess for post partum cardiomyopathy. Lopressor PRN to maintain HR < 115. Trend troponins. Repeat EKG, lactate.  RENAL A:   Non AG metabolic acidosis. AKI. Pseudohypocalcemia - corrects to 9.72. P:   HCO3 at 100/hr. Send ionized calcium. BMP in AM.  GASTROINTESTINAL A:   Hypoalbumineia. Hyperbilirubinemia. GI prophylaxis. Nutrition. P:   LFT's in AM. SUP: Pantoprazole. NPO. Nutrition consult once extubated for diet education / calorie counts.  HEMATOLOGIC A:   Anemia - microcytic; chronic. Thrombocytopenia without evidence of bleeding. VTE Prophylaxis. P:  Transfuse for Hgb < 7. Monitor platelet counts. SCD's / Lovenox. CBC in AM.  ENDOCRINE A:   Mild hyperthyroidism.   P:   Repeat TSH / free T4 in a few weeks, defer any treatment for now.  NEUROLOGIC A:   Acute metabolic encephalopathy. P:   Sedation:  Fentanyl gtt / Midazolam PRN. RASS goal: 0 to -1. Daily WUA.   Family updated: Mother by RN, she is on her way to hospital.  Rutherford Guys, PA - [facility] Pager: [phone]  or [phone] 05/06/2015, 1:32 PM  Attending Note:  19 year old female who delivered 04/11/2015.  Subsequently developed endometritis and was hospitalized and underwent a D&C then discharged.  Return to the hospital with UTI and pyelo with E. Coli in the blood and urine.  On 9/20 the patient started complaining of chest and back pain, became tachycardic and hypotensive.  Also blood work revealed decreasing WBC and platelet.  Concern is for dissection vs post partum cardiomyopathy vs septic shock with ARDS.  I spoke with cardiology and STAT echo is ordered and pending.  Would like to have a CT of the chest to  determine dissection vs cardiomyopathy vs endocarditis however renal function is starting to deteriorate specially with hypotension.  Will also order pressors for BP support.  CVP as ordered.  Transvaginal U/S to determine if there are any retained material in the uterus that can be causing infection.  Peripheral smear as ordered to determine DIC as well as INR and PTT as ordered.  The patient is  critically ill with multiple organ systems failure and requires high complexity decision making for assessment and support, frequent evaluation and titration of therapies, application of advanced monitoring technologies and extensive interpretation of multiple databases.   Critical Care Time devoted to patient care services described in this note is  90  Minutes. This time reflects time of care of this signee Dr Koren Bound. This critical care time does not reflect procedure time, or teaching time or supervisory time of PA/NP/Med student/Med Resident etc but could involve care discussion time.  Alyson Reedy, M.D. [facility] Pulmonary/Critical Care Medicine. Pager: [phone]. After hours pager: [phone].

## 2015-05-06 NOTE — Procedures (Signed)
Central Venous Catheter Insertion Procedure Note Daniyla Lutey [id number] 12-01-1995  Procedure: Insertion of Central Venous Catheter Indications: Assessment of intravascular volume, Drug and/or fluid administration and Frequent blood sampling  Procedure Details Consent: Unable to obtain consent because of emergent medical necessity. Time Out: Verified patient identification, verified procedure, site/side was marked, verified correct patient position, special equipment/implants available, medications/allergies/relevent history reviewed, required imaging and test results available.  Performed  Maximum sterile technique was used including antiseptics, cap, gloves, gown, hand hygiene, mask and sheet. Skin prep: Chlorhexidine; local anesthetic administered A antimicrobial bonded/coated triple lumen catheter was placed in the left internal jugular vein using the Seldinger technique.  Evaluation Blood flow good Complications: No apparent complications Patient did tolerate procedure well. Chest X-ray ordered to verify placement.  CXR: pending.  U/S used in placement.  YACOUB,WESAM 05/06/2015, 1:52 PM

## 2015-05-06 NOTE — Progress Notes (Signed)
Per MD Molli Knock, give 2 mg of Versed, and 100 mg of fentanyl. Raymon Mutton as second Charity fundraiser to verify. Will continue to monitor.

## 2015-05-06 NOTE — Progress Notes (Signed)
Patient was transported via vent on 100% O2 to CT.  Patient vitals remained stable throughout transport and there were no complications noted to, during, or returning from CT. Patient was returned back to room and RN at bedside. RT will continue to monitor patient.

## 2015-05-06 NOTE — Progress Notes (Signed)
Paged MD Joseph Art, notified patient HR ST 120's-130s, tachypenic and febrile of 102.7. ABG, VQ scan, chest xray ordered. Will continue to monitor.

## 2015-05-07 ENCOUNTER — Inpatient Hospital Stay (HOSPITAL_COMMUNITY): Payer: Medicaid Other

## 2015-05-07 DIAGNOSIS — I7100 Dissection of unspecified site of aorta: Secondary | ICD-10-CM | POA: Diagnosis present

## 2015-05-07 DIAGNOSIS — M545 Low back pain: Secondary | ICD-10-CM

## 2015-05-07 DIAGNOSIS — N170 Acute kidney failure with tubular necrosis: Secondary | ICD-10-CM

## 2015-05-07 DIAGNOSIS — I471 Supraventricular tachycardia: Secondary | ICD-10-CM

## 2015-05-07 DIAGNOSIS — M549 Dorsalgia, unspecified: Secondary | ICD-10-CM | POA: Diagnosis present

## 2015-05-07 LAB — BASIC METABOLIC PANEL
Anion gap: 7 (ref 5–15)
Anion gap: 8 (ref 5–15)
BUN: 5 mg/dL — AB (ref 6–20)
CALCIUM: 7.4 mg/dL — AB (ref 8.9–10.3)
CHLORIDE: 104 mmol/L (ref 101–111)
CO2: 22 mmol/L (ref 22–32)
CO2: 24 mmol/L (ref 22–32)
CREATININE: 1.06 mg/dL — AB (ref 0.44–1.00)
CREATININE: 1.08 mg/dL — AB (ref 0.44–1.00)
Calcium: 7.5 mg/dL — ABNORMAL LOW (ref 8.9–10.3)
Chloride: 108 mmol/L (ref 101–111)
GFR calc Af Amer: >60 mL/min (ref >60)
GFR calc non Af Amer: >60 mL/min (ref >60)
GLUCOSE: 121 mg/dL — AB (ref 65–99)
Glucose, Bld: 95 mg/dL (ref 65–99)
Potassium: 3.4 mmol/L — ABNORMAL LOW (ref 3.5–5.1)
Potassium: 3.1 mmol/L — ABNORMAL LOW (ref 3.5–5.1)
SODIUM: 137 mmol/L (ref 135–145)
SODIUM: 136 mmol/L (ref 135–145)

## 2015-05-07 LAB — CBC
HCT: 21.4 % — ABNORMAL LOW (ref 36.0–46.0)
HCT: 19.8 % — ABNORMAL LOW (ref 36.0–46.0)
HEMOGLOBIN: 6.5 g/dL — AB (ref 12.0–15.0)
Hemoglobin: 7 g/dL — ABNORMAL LOW (ref 12.0–15.0)
MCH: 25.2 pg — AB (ref 26.0–34.0)
MCH: 24.7 pg — AB (ref 26.0–34.0)
MCHC: 32.7 g/dL (ref 30.0–36.0)
MCHC: 32.8 g/dL (ref 30.0–36.0)
MCV: 77 fL — ABNORMAL LOW (ref 78.0–100.0)
MCV: 75.3 fL — AB (ref 78.0–100.0)
PLATELETS: 89 10*3/uL — AB (ref 150–400)
Platelets: 85 10*3/uL — ABNORMAL LOW (ref 150–400)
RBC: 2.78 MIL/uL — ABNORMAL LOW (ref 3.87–5.11)
RBC: 2.63 MIL/uL — ABNORMAL LOW (ref 3.87–5.11)
RDW: 16.8 % — AB (ref 11.5–15.5)
RDW: 16.7 % — ABNORMAL HIGH (ref 11.5–15.5)
WBC: 5.1 10*3/uL (ref 4.0–10.5)
WBC: 8.3 10*3/uL (ref 4.0–10.5)

## 2015-05-07 LAB — POCT I-STAT 3, ART BLOOD GAS (G3+)
ACID-BASE DEFICIT: 2 mmol/L (ref 0.0–2.0)
ACID-BASE EXCESS: 2 mmol/L (ref 0.0–2.0)
BICARBONATE: 24 meq/L (ref 20.0–24.0)
BICARBONATE: 21.8 meq/L (ref 20.0–24.0)
O2 Saturation: 100 %
O2 Saturation: 91 %
PO2 ART: 465 mmHg — AB (ref 80.0–100.0)
PO2 ART: 56 mmHg — AB (ref 80.0–100.0)
TCO2: 25 mmol/L (ref 0–100)
TCO2: 23 mmol/L (ref 0–100)
pCO2 arterial: 25.9 mmHg — ABNORMAL LOW (ref 35.0–45.0)
pCO2 arterial: 31.5 mmHg — ABNORMAL LOW (ref 35.0–45.0)
pH, Arterial: 7.574 — ABNORMAL HIGH (ref 7.350–7.450)
pH, Arterial: 7.447 (ref 7.350–7.450)

## 2015-05-07 LAB — TROPONIN I: Troponin I: 0.03 ng/mL (ref <0.031)

## 2015-05-07 LAB — MAGNESIUM
MAGNESIUM: 1.4 mg/dL — AB (ref 1.7–2.4)
MAGNESIUM: 2.5 mg/dL — AB (ref 1.7–2.4)

## 2015-05-07 LAB — RAPID URINE DRUG SCREEN, HOSP PERFORMED
Amphetamines: NOT DETECTED
BARBITURATES: NOT DETECTED
BENZODIAZEPINES: POSITIVE — AB
COCAINE: NOT DETECTED
Opiates: NOT DETECTED
TETRAHYDROCANNABINOL: NOT DETECTED

## 2015-05-07 LAB — CALCIUM, IONIZED: Calcium, Ionized, Serum: 4.5 mg/dL (ref 4.5–5.6)

## 2015-05-07 LAB — PROCALCITONIN: Procalcitonin: 123.59 ng/mL

## 2015-05-07 LAB — PHOSPHORUS
PHOSPHORUS: 1.5 mg/dL — AB (ref 2.5–4.6)
Phosphorus: 5 mg/dL — ABNORMAL HIGH (ref 2.5–4.6)

## 2015-05-07 MED ORDER — POTASSIUM CHLORIDE 20 MEQ/15ML (10%) PO SOLN
20.0000 meq | ORAL | Status: DC
Start: 2015-05-07 — End: 2015-05-07

## 2015-05-07 MED ORDER — POTASSIUM CHLORIDE CRYS ER 20 MEQ PO TBCR
40.0000 meq | EXTENDED_RELEASE_TABLET | Freq: Once | ORAL | Status: AC
  Administered 2015-05-07: 40 meq via ORAL

## 2015-05-07 MED ORDER — MAGNESIUM SULFATE 2 GM/50ML IV SOLN
2.0000 g | INTRAVENOUS | Status: AC
Start: 2015-05-07 — End: 2015-05-07
  Administered 2015-05-07 (×3): 2 g via INTRAVENOUS
  Filled 2015-05-07 (×3): qty 50

## 2015-05-07 MED ORDER — POTASSIUM PHOSPHATES 15 MMOLE/5ML IV SOLN
30.0000 mmol | Freq: Once | INTRAVENOUS | Status: AC
  Administered 2015-05-07: 30 mmol via INTRAVENOUS
  Filled 2015-05-07: qty 10

## 2015-05-07 MED ORDER — DEXTROSE 5 % IV SOLN
2.0000 g | INTRAVENOUS | Status: DC
  Administered 2015-05-07 – 2015-05-09 (×3): 2 g via INTRAVENOUS
  Filled 2015-05-07 (×3): qty 2

## 2015-05-07 MED ORDER — FUROSEMIDE 10 MG/ML IJ SOLN
10.0000 mg | Freq: Two times a day (BID) | INTRAMUSCULAR | Status: AC
  Administered 2015-05-07 – 2015-05-08 (×2): 10 mg via INTRAVENOUS
  Filled 2015-05-07 (×2): qty 2

## 2015-05-07 MED ORDER — METOPROLOL TARTRATE 12.5 MG HALF TABLET
12.5000 mg | ORAL_TABLET | Freq: Two times a day (BID) | ORAL | Status: DC
  Administered 2015-05-07 – 2015-05-10 (×7): 12.5 mg via ORAL
  Filled 2015-05-07 (×8): qty 1

## 2015-05-07 MED ORDER — SODIUM CHLORIDE 0.9 % IV SOLN
Freq: Once | INTRAVENOUS | Status: AC
  Administered 2015-05-07: 23:00:00 via INTRAVENOUS

## 2015-05-07 MED ORDER — FENTANYL CITRATE (PF) 100 MCG/2ML IJ SOLN
25.0000 ug | INTRAMUSCULAR | Status: DC | PRN
  Administered 2015-05-07 (×3): 25 ug via INTRAVENOUS
  Administered 2015-05-08 – 2015-05-09 (×3): 50 ug via INTRAVENOUS
  Administered 2015-05-09: 25 ug via INTRAVENOUS
  Administered 2015-05-09 – 2015-05-10 (×3): 50 ug via INTRAVENOUS
  Filled 2015-05-07 (×10): qty 2

## 2015-05-07 NOTE — Progress Notes (Signed)
CRITICAL VALUE ALERT  Critical value received:  Hb: 6.5  Date of notification:  05/07/2015  Time of notification: 2024  Critical value read back:Yes.    Nurse who received alert:  Kris Hartmann  MD notified (1st page):  Pola Corn MD  Time of first page:  2039  Responding MD:  Dr. Arsenio Loader  Time MD responded:  2030

## 2015-05-07 NOTE — Progress Notes (Signed)
110 ml fentynl wasted in sink  35 versed wasted in sink with Janna Arch RN

## 2015-05-07 NOTE — Progress Notes (Signed)
[facility] ICU Electrolyte Replacement Protocol  Patient Name: Deanna Sullivan DOB: 30-Dec-1995 MRN: [id number]  Date of Service  05/07/2015   HPI/Events of Note    Recent Labs Lab 05/04/15 0600 05/04/15 1900 05/05/15 0225 05/05/15 1445 05/06/15 0310 05/07/15 0459  NA 139  --  138 139 136 137  K 2.9*  --  2.9* 3.5 3.9 3.4*  CL 112*  --  114* 114* 113* 108  CO2 18*  --  15* 18* 17* 22  GLUCOSE 107*  --  104* 92 107* 95  BUN 16  --  _0 5*  CREATININE 1.33*  --  1.32* 1.16* 1.18* 1.06*  CALCIUM 7.0*  --  6.8* 7.6* 7.8* 7.4*  MG  --  1.1*  --  2.7*  --  1.4*  PHOS  --   --   --   --   --  1.5*    Estimated Creatinine Clearance: 70.6 mL/min (by C-G formula based on Cr of 1.06).  Intake/Output      09/20 0701 - 09/21 0700   P.O. 120   I.V. (mL/kg) 1409.1 (24.2)   NG/GT 30   IV Piggyback 2400   Total Intake(mL/kg) 3959.1 (67.9)   Urine (mL/kg/hr) 2575 (1.8)   Emesis/NG output 350 (0.3)   Total Output 2925   Net +1034.1        - I/O DETAILED x24h    Total I/O In: 778.6 [I.V.:728.6; IV Piggyback:50] Out: 1300 [Urine:1300] - I/O THIS SHIFT    ASSESSMENT   eICURN Interventions  Electrolyte protocol criteria met. Irregular labs replaced per protocol. MD notified.   ASSESSMENT: MAJOR ELECTROLYTE    [name] 05/07/2015, 6:20 AM

## 2015-05-07 NOTE — Consult Note (Addendum)
PULMONARY / CRITICAL CARE MEDICINE   Name: Deanna Sullivan MRN: [id number] DOB: 05-26-1996    ADMISSION DATE:  05/03/2015 CONSULTATION DATE:  05/06/15  REFERRING MD :  Joseph Art  CHIEF COMPLAINT:  Respiratory Distress  INITIAL PRESENTATION:  19 y.o. F who is two weeks postpartum from spontaneous vaginal delivery (04/15/15), brought to [facility] ED 05/03/15 with back and abd pain.  She was found to have sepsis due to pyelonephritis.  She was admitted to SDU by [facility].  On 9/19, she had back pain and chest pain followed by confusion and spike in fever.  She was transferred to the ICU and was intubated for respiratory insufficiency.   STUDIES:  CT A / P 9/17 >>> right pyelo, no renal abscess.  Periportal edema and diffuse GB wall thickening. Small volume perihepatic ascites.  CXR 9/20 >>> small effusions. VQ Scan 9/20 >>> low prob for PE. LE duplex 9/20 >>> negative for DVT.  SIGNIFICANT EVENTS: 8/30 - spontaneous vaginal delivery. 9/17 - admitted with sepsis due to pyelo. 9/20 - chest pain, back pain, respiratory insufficiency.  Transferred to ICU and intubated, CVL placed. 9/21- self extubated, high RR remains  SUBJECTIVE: some increase RR  VITAL SIGNS: Temp:  [98.3 F (36.8 C)-105 F (40.6 C)] 98.7 F (37.1 C) (09/21 0852) Pulse Rate:  [81-155] 140 (09/21 0900) Resp:  [12-40] 31 (09/21 0900) BP: (88-135)/(32-115) 110/46 mmHg (09/21 0900) SpO2:  [82 %-100 %] 100 % (09/21 0900) FiO2 (%):  [30 %-100 %] 30 % (09/21 0800) HEMODYNAMICS: CVP:  [7 mmHg-62 mmHg] 7 mmHg VENTILATOR SETTINGS: Vent Mode:  [-] PCV FiO2 (%):  [30 %-100 %] 30 % Set Rate:  [20 bmp-30 bmp] 30 bmp Vt Set:  [420 mL] 420 mL PEEP:  [5 cmH20-10 cmH20] 5 cmH20 Plateau Pressure:  [20 cmH20-25 cmH20] 24 cmH20 INTAKE / OUTPUT: Intake/Output      09/20 0701 - 09/21 0700 09/21 0701 - 09/22 0700   P.O. 120    I.V. (mL/kg) 1746 (29.9) 300 (5.1)   NG/GT 130    IV Piggyback 2600    Total Intake(mL/kg) 4596 (78.8) 300 (5.1)    Urine (mL/kg/hr) 3075 (2.2) 450 (2.9)   Emesis/NG output 350 (0.3)    Total Output 3425 450   Net +1171 -150          PHYSICAL EXAMINATION: General: Young AA female-extubated, alert but sleepy Neuro: Alert, follows commands. HEENT: [place]/AT. Sclerae anicteric. Cardiovascular: RRR, no M/R/G.  Lungs: Respirations even and mild labored.  CTA bilaterally Abdomen: BS x 4, soft, NT/ND.  Musculoskeletal: No gross deformities, no edema.  Skin: Intact, warm, no rashes.  LABS:  CBC  Recent Labs Lab 05/05/15 0225 05/06/15 0310 05/07/15 0459  WBC 5.9 3.8* 5.1  HGB 7.9* 8.0* 7.0*  HCT 24.4* 24.6* 21.4*  PLT 198 128* 89*   Coag's  Recent Labs Lab 05/03/15 1820 05/06/15 1907  APTT 34 23*  INR 1.66* 1.45   BMET  Recent Labs Lab 05/05/15 1445 05/06/15 0310 05/07/15 0459  NA 139 136 137  K 3.5 3.9 3.4*  CL 114* 113* 108  CO2 18* 17* 22  BUN 7 7 5*  CREATININE 1.16* 1.18* 1.06*  GLUCOSE 92 107* 95   Electrolytes  Recent Labs Lab 05/04/15 1900  05/05/15 1445 05/06/15 0310 05/07/15 0459  CALCIUM  --   < > 7.6* 7.8* 7.4*  MG 1.1*  --  2.7*  --  1.4*  PHOS  --   --   --   --  1.5*  < > = values in this interval not displayed. Sepsis Markers  Recent Labs Lab 05/03/15 1820  05/04/15 0600 05/06/15 0951 05/06/15 1437 05/07/15 0459  LATICACIDVEN  --   < > 1.6 1.3 1.2  --   PROCALCITON 9.78  --   --   --  36.86 123.59  < > = values in this interval not displayed. ABG  Recent Labs Lab 05/06/15 1451 05/07/15 0309 05/07/15 0744  PHART 7.250* 7.574* 7.447  PCO2ART 33.7* 25.9* 31.5*  PO2ART 58.0* 465.0* 56.0*   Liver Enzymes  Recent Labs Lab 05/04/15 0600 05/05/15 0225 05/06/15 0310  AST 28 19 24   ALT 14 12* 14  ALKPHOS 149* 123 136*  BILITOT 2.3* 2.0* 2.0*  ALBUMIN 1.9* 1.7* 1.6*   Cardiac Enzymes  Recent Labs Lab 05/06/15 1437 05/06/15 1907 05/07/15 0051  TROPONINI 0.09* 0.04* 0.03   Glucose  Recent Labs Lab 05/06/15 1609  GLUCAP 110*     Imaging Nm Pulmonary Perfusion  05/06/2015   CLINICAL DATA:  Recent postpartum patient was actively breastfeeding with right-sided abdominal and back pain.  EXAM: NUCLEAR MEDICINE VENTILATION AND PERFUSION SCAN  TECHNIQUE: Perfusion images were obtained in multiple projections after intravenous injection of radiopharmaceutical.  RADIOPHARMACEUTICALS:  3 mCi Tc66m MAA IV  COMPARISON:  Chest radiograph 05/06/2015  FINDINGS: Profusion images demonstrate no profusion abnormality. Radiotracer is homogeneously distributed throughout the lungs bilaterally.  IMPRESSION: Very low probability for pulmonary embolism (PE absent).   Electronically Signed   By: Annia Belt M.D.   On: 05/06/2015 11:30   US Transvaginal Non-ob  05/07/2015   CLINICAL DATA:  Retained products of conception.  Septic shock.  EXAM: TRANSABDOMINAL AND TRANSVAGINAL ULTRASOUND OF PELVIS  TECHNIQUE: Both transabdominal and transvaginal ultrasound examinations of the pelvis were performed. Transabdominal technique was performed for global imaging of the pelvis including uterus, ovaries, adnexal regions, and pelvic cul-de-sac. It was necessary to proceed with endovaginal exam following the transabdominal exam to visualize the uterus and ovaries.  COMPARISON:  CT 05/06/2015.  FINDINGS: Uterus  Measurements: 11.6 x 5.2 x 6.9 cm. No fibroids or other mass visualized.  Endometrium  Thickness: 6.7 mm. No focal abnormality identified. No retained products of conception noted. Trace endometrial fluid.  Right ovary  Measurements: 3.1 x 2.1 x 2.9 cm. Normal appearance/no adnexal mass.  Left ovary  Measurements: 2.7 x 2.0 x 2.9 cm. Normal appearance/no adnexal mass.  Other findings  Large amount of free pelvic fluid.  IMPRESSION: 1. Large amount of free pelvic fluid. 2. No evidence of endometrial thickening or retained products of conception. Trace residual endometrial fluid.   Electronically Signed   By: Maisie Fus  Register   On: 05/07/2015 07:39   US Pelvis  Complete  05/07/2015   CLINICAL DATA:  Retained products of conception.  Septic shock.  EXAM: TRANSABDOMINAL AND TRANSVAGINAL ULTRASOUND OF PELVIS  TECHNIQUE: Both transabdominal and transvaginal ultrasound examinations of the pelvis were performed. Transabdominal technique was performed for global imaging of the pelvis including uterus, ovaries, adnexal regions, and pelvic cul-de-sac. It was necessary to proceed with endovaginal exam following the transabdominal exam to visualize the uterus and ovaries.  COMPARISON:  CT 05/06/2015.  FINDINGS: Uterus  Measurements: 11.6 x 5.2 x 6.9 cm. No fibroids or other mass visualized.  Endometrium  Thickness: 6.7 mm. No focal abnormality identified. No retained products of conception noted. Trace endometrial fluid.  Right ovary  Measurements: 3.1 x 2.1 x 2.9 cm. Normal appearance/no adnexal mass.  Left ovary  Measurements: 2.7 x 2.0 x 2.9 cm. Normal appearance/no adnexal mass.  Other findings  Large amount of free pelvic fluid.  IMPRESSION: 1. Large amount of free pelvic fluid. 2. No evidence of endometrial thickening or retained products of conception. Trace residual endometrial fluid.   Electronically Signed   By: Maisie Fus  Register   On: 05/07/2015 07:39   Dg Chest Port 1 View  05/07/2015   CLINICAL DATA:  Intubation.  EXAM: PORTABLE CHEST - 1 VIEW  COMPARISON:  CT 05/06/2015.  Chest x-ray 05/06/2015.  FINDINGS: Endotracheal tube and NG tube in stable position. Left IJ line in stable position. Heart size stable. Progressive diffuse bilateral pulmonary infiltrates and/or edema. Right pleural effusion again noted. No pneumothorax.  IMPRESSION: 1. Lines and tubes in stable position. 2. Progressive diffuse bilateral pulmonary infiltrates and/or edema. Right pleural effusion again noted.   Electronically Signed   By: Maisie Fus  Register   On: 05/07/2015 07:49   Portable Chest Xray  05/06/2015   CLINICAL DATA:  Post intubation and central line placement.  EXAM: PORTABLE CHEST -  1 VIEW  COMPARISON:  Earlier the same day.  FINDINGS: There has been interval intubation with the endotracheal tube overlying the tracheal air column and terminating at the level of the clavicular heads. Enteric catheter transverses the thorax, tip collimated off the image. Left-sided internal jugular approach central venous catheter terminates within the expected location of right atrium.  Cardiomediastinal silhouette is normal for portable technique. Mediastinal contours appear intact.  There is no evidence of pneumothorax. There are bilateral interstitial and alveolar opacities, with right pleural effusion. Left subpulmonic effusion is not excluded.  Osseous structures are without acute abnormality. Soft tissues are grossly normal.  IMPRESSION: Status post intubation with endotracheal tube in satisfactory position.  Enteric catheter collimated off the image.  Central venous catheter with tip in the expected location of the right atrium.  Appearance of the lungs suggestive of pulmonary edema, with right pleural effusion. Left subpulmonic effusion is not excluded.   Electronically Signed   By: Ted Mcalpine M.D.   On: 05/06/2015 15:02   Ct Angio Chest Aorta W/cm &/or Wo/cm  05/06/2015   EXAM: CT ANGIOGRAPHY CHEST, ABDOMEN AND PELVIS  TECHNIQUE: Multidetector CT imaging through the chest, abdomen and pelvis was performed using the standard protocol during bolus administration of intravenous contrast. Multiplanar reconstructed images and MIPs were obtained and reviewed to evaluate the vascular anatomy.  CONTRAST:  OMNIPAQUE IOHEXOL 350 MG/ML SOLN  COMPARISON:  CT scan abdomen and pelvis 05/03/2015  FINDINGS: CTA CHEST FINDINGS  Mediastinum: The patient is intubated. The tip of the endotracheal tube terminates just above the carina. A nasogastric tube is present and terminates in the gastric antrum. There is a left IJ approach central venous catheter. The tip of the catheter terminates at the superior  cavoatrial junction. Unremarkable thyroid gland and thoracic inlet.  Heart/Vascular: Conventional 3 vessel aortic arch. No aneurysm or dissection. Excellent opacification of the pulmonary arteries. No evidence of pulmonary embolus. The heart is within normal limits for size. No pericardial effusion.  Lungs/Pleura: Moderate right and small left layering pleural effusions with significant bilateral left lower lobe atelectasis. Superimposed infiltrate difficult to exclude.  Bones/Soft Tissues: No acute fracture or aggressive appearing lytic or blastic osseous lesion.  Review of the MIP images confirms the above findings.  CTA ABDOMEN AND PELVIS FINDINGS  VASCULAR  Aorta: Normal caliber abdominal aorta. No evidence of aneurysm or dissection.  Celiac: The left hepatic artery is  replaced to the left gastric artery.  SMA: Widely patent and unremarkable.  Renals: Single dominant renal arteries bilaterally which are widely patent and unremarkable.  IMA: Widely patent and unremarkable.  Inflow: Widely patent and unremarkable.  Proximal Outflow: Widely patent and unremarkable.  Veins: No focal venous abnormality identified.  NON-VASCULAR  Abdomen: A combination of streak artifact from the arms and arterial phase timing slightly limits evaluation of the abdomen and pelvis. The nasogastric tube terminates in the gastric antrum. Unremarkable CT appearance of the stomach, duodenum, spleen, adrenal glands and pancreas. The liver is normal in size. There is diffuse periportal edema and extensive thickening of the gallbladder wall. The gallbladder lumen itself is not distended. The portal veins remain patent.  Diffusely enlarged and edematous right kidney with patchy arterial phase enhancement is most consistent with severe pyelonephritis. Mild fullness of the right renal collecting system without true hydronephrosis. Minimal patchy enhancement of the left kidney favored to be secondary to streak artifact rather than true parenchymal  edema.  Small volume free fluid in the bilateral pericolic gutters and anatomic pelvis.  Pelvis: Free fluid layers within the pelvis. Hyperenhancing and post gravid appearance of the uterus. Foley catheter is present within the bladder.  Bones/Soft Tissues: Reticulation of the subcutaneous fat consistent with mild anasarca. No acute fracture or aggressive appearing lytic or blastic osseous lesion.  Review of the MIP images confirms the above findings.  IMPRESSION: CTA CHEST  1. No evidence of acute aortic dissection or other arterial abnormality. 2. No evidence of acute pulmonary embolus. 3. Moderate right and small left layering pleural effusions. No associated pulmonary edema. 4. Lobar atelectasis of the bilateral lower lobes. Superimposed infiltrate or aspiration is difficult to exclude entirely. 5. The tip of the endotracheal tube terminates above the carina. 6. The tip of the left IJ approach central venous catheter terminates at the superior cavoatrial junction.  CTA ABD/PELVIS  1. No evidence of acute aortic or other arterial abnormality. 2. Findings remain consistent with severe right-sided pyelonephritis. 3. There may be mild fullness of the right renal collecting system without true hydronephrosis. 4. Periportal edema, diffuse thickening of the gallbladder wall, subcutaneous anasarca and free fluid within the abdomen favored to be secondary to third spacing in the setting of sepsis and aggressive hydration. 5. The portal veins remain patent. 6. The bladder contains a moderate amount of urine despite the presence of the Foley catheter.   Electronically Signed   By: Malachy Moan M.D.   On: 05/06/2015 18:45   Ct Angio Abd/pel W/ And/or W/o  05/06/2015   EXAM: CT ANGIOGRAPHY CHEST, ABDOMEN AND PELVIS  TECHNIQUE: Multidetector CT imaging through the chest, abdomen and pelvis was performed using the standard protocol during bolus administration of intravenous contrast. Multiplanar reconstructed images  and MIPs were obtained and reviewed to evaluate the vascular anatomy.  CONTRAST:  OMNIPAQUE IOHEXOL 350 MG/ML SOLN  COMPARISON:  CT scan abdomen and pelvis 05/03/2015  FINDINGS: CTA CHEST FINDINGS  Mediastinum: The patient is intubated. The tip of the endotracheal tube terminates just above the carina. A nasogastric tube is present and terminates in the gastric antrum. There is a left IJ approach central venous catheter. The tip of the catheter terminates at the superior cavoatrial junction. Unremarkable thyroid gland and thoracic inlet.  Heart/Vascular: Conventional 3 vessel aortic arch. No aneurysm or dissection. Excellent opacification of the pulmonary arteries. No evidence of pulmonary embolus. The heart is within normal limits for size. No pericardial effusion.  Lungs/Pleura:  Moderate right and small left layering pleural effusions with significant bilateral left lower lobe atelectasis. Superimposed infiltrate difficult to exclude.  Bones/Soft Tissues: No acute fracture or aggressive appearing lytic or blastic osseous lesion.  Review of the MIP images confirms the above findings.  CTA ABDOMEN AND PELVIS FINDINGS  VASCULAR  Aorta: Normal caliber abdominal aorta. No evidence of aneurysm or dissection.  Celiac: The left hepatic artery is replaced to the left gastric artery.  SMA: Widely patent and unremarkable.  Renals: Single dominant renal arteries bilaterally which are widely patent and unremarkable.  IMA: Widely patent and unremarkable.  Inflow: Widely patent and unremarkable.  Proximal Outflow: Widely patent and unremarkable.  Veins: No focal venous abnormality identified.  NON-VASCULAR  Abdomen: A combination of streak artifact from the arms and arterial phase timing slightly limits evaluation of the abdomen and pelvis. The nasogastric tube terminates in the gastric antrum. Unremarkable CT appearance of the stomach, duodenum, spleen, adrenal glands and pancreas. The liver is normal in size. There is  diffuse periportal edema and extensive thickening of the gallbladder wall. The gallbladder lumen itself is not distended. The portal veins remain patent.  Diffusely enlarged and edematous right kidney with patchy arterial phase enhancement is most consistent with severe pyelonephritis. Mild fullness of the right renal collecting system without true hydronephrosis. Minimal patchy enhancement of the left kidney favored to be secondary to streak artifact rather than true parenchymal edema.  Small volume free fluid in the bilateral pericolic gutters and anatomic pelvis.  Pelvis: Free fluid layers within the pelvis. Hyperenhancing and post gravid appearance of the uterus. Foley catheter is present within the bladder.  Bones/Soft Tissues: Reticulation of the subcutaneous fat consistent with mild anasarca. No acute fracture or aggressive appearing lytic or blastic osseous lesion.  Review of the MIP images confirms the above findings.  IMPRESSION: CTA CHEST  1. No evidence of acute aortic dissection or other arterial abnormality. 2. No evidence of acute pulmonary embolus. 3. Moderate right and small left layering pleural effusions. No associated pulmonary edema. 4. Lobar atelectasis of the bilateral lower lobes. Superimposed infiltrate or aspiration is difficult to exclude entirely. 5. The tip of the endotracheal tube terminates above the carina. 6. The tip of the left IJ approach central venous catheter terminates at the superior cavoatrial junction.  CTA ABD/PELVIS  1. No evidence of acute aortic or other arterial abnormality. 2. Findings remain consistent with severe right-sided pyelonephritis. 3. There may be mild fullness of the right renal collecting system without true hydronephrosis. 4. Periportal edema, diffuse thickening of the gallbladder wall, subcutaneous anasarca and free fluid within the abdomen favored to be secondary to third spacing in the setting of sepsis and aggressive hydration. 5. The portal veins  remain patent. 6. The bladder contains a moderate amount of urine despite the presence of the Foley catheter.   Electronically Signed   By: Malachy Moan M.D.   On: 05/06/2015 18:45     ASSESSMENT / PLAN:  INFECTIOUS A:   Bacteremia - BCx's positive for GNR's with E.Coli speciation. UTI / pyelonephritis - E.coli. P:   BCx2 9/17 >>> GNR'S >>> E.COLI (pan sens). UCx >>> E.COLI (pan sens). Abx: Vanc, start date 9/20, day 1/x. Abx: Zosyn, start date 9/20, day 1/x. PCT algorithm to limit abx exposure.  PULMONARY OETT 9/20 >>> 9/21 self-extubated A: VDRF due to inability to protect airway during distress 9/20. pulm edema? Etiology? cariomyoapthy P:   Wean to nasal cannula as tolerated  Re-intubate as necessary  Neg balance goals remain, ct with effusion / edema pcxr Repeat ABg   CARDIOVASCULAR CVL L IJ 9/20 >>> A:  Sinus tachycardia. cardiomyopathy etiology unknown - peri partum P:  Lopressor PRN to maintain HR < 115. IVF  Echo repeat in 2-4 weeks Repeat tox screen Cards evaluation BB when able Avoid tachy  RENAL A:   Non AG metabolic acidosis > Resolved  AKI Hypokalemia P:   D/c HCO3  Repeat BMP in AM. KVO Replace K, phos, mag  GASTROINTESTINAL A:   Hypoalbumineia. Hyperbilirubinemia. GI prophylaxis. Nutrition. P:   LFT's in AM. SUP: Pantoprazole. Nutrition consult for diet education / calorie counts.  HEMATOLOGIC A:   Anemia - microcytic; chronic. Thrombocytopenia without evidence of bleeding (sepsis) and dilution VTE Prophylaxis. P:  Transfuse 1 Unit PRBC if less 7 Monitor platelet counts. SCD's / Lovenox, may need to dc if drops further in am  CBC in AM.  ENDOCRINE A:   Mild hyperthyroidism vs sick euthyroid  P:   Repeat TSH / free T4 in a few weeks, defer any treatment for now. Assess t3  NEUROLOGIC A:   Acute metabolic encephalopathy >improved  P:   abg  Family updated: No family at bedside at this time   Rutherford Guys, [place] -  [facility] Pager: [phone]  or [phone] 05/07/2015, 9:41 AM   STAFF NOTE: I, Rory Percy, MD FACP have personally reviewed patient's available data, including medical history, events of note, physical examination and test results as part of my evaluation. I have discussed with resident/NP and other care providers such as pharmacist, RN and RRT. In addition, I personally evaluated patient and elicited key findings of: just self extubated, rr up, does awaken, monitor further in ICU, Ecoli noted, narrow to ceftriaxone, add stop date, abg done, may be able to advance diet , assess cards consult for peripartum cardiomyopathy>?>?, assess T3 , kvo, may need early lasix, LA low, could thi sbe HIV cardiomyoapthy, re test hiv and get rna The patient is critically ill with multiple organ systems failure and requires high complexity decision making for assessment and support, frequent evaluation and titration of therapies, application of advanced monitoring technologies and extensive interpretation of multiple databases.   Critical Care Time devoted to patient care services described in this note is 30 Minutes. This time reflects time of care of this signee: Rory Percy, MD FACP. This critical care time does not reflect procedure time, or teaching time or supervisory time of PA/NP/Med student/Med Resident etc but could involve care discussion time. Rest per NP/medical resident whose note is outlined above and that I agree with   Mcarthur Rossetti. Tyson Alias, MD, FACP Pgr: [phone] [facility] 05/07/2015 11:41 AM   '

## 2015-05-07 NOTE — Progress Notes (Signed)
RN called RT and communicated that pt self extubated. Pt is stable at this time. No distress noted. RT will continue to monitor.

## 2015-05-07 NOTE — Progress Notes (Signed)
eLink Physician-Brief Progress Note Patient Name: Deanna Sullivan DOB: Jun 14, 1996 MRN: [id number]   Date of Service  05/07/2015  HPI/Events of Note  Symptomatic Anemia - Hgb = 6.5.  eICU Interventions  Will transfuse 1 unit PRBC now.      Intervention Category Major Interventions: Other:  Lenell Antu 05/07/2015, 8:32 PM

## 2015-05-07 NOTE — Progress Notes (Signed)
Pt found to be in distress. Pt resp in the 30's, HR130, Pox is 87 on 2l o2nc, pt feel sob. Pt is restless. Np paged and rapid response notified. 0000 Pt vomited Lg amount of clear emesis. Pt states that she feel much better. Resp 20's  HR 120's POX 100% on 4L. Will continue to monitor pt.

## 2015-05-07 NOTE — Consult Note (Signed)
CARDIOLOGY CONSULT NOTE   Patient ID: Deanna Sullivan MRN: [id number] DOB/AGE: May 27, 1996 19 y.o.  Admit date: 05/03/2015  Primary Physician   No primary care provider on file. Primary Cardiologist   None Reason for Consultation   Post partum cardiomyopathy  HPI: Deanna Sullivan is a 19 y.o. female with a PMH of anemia as well as recent vaginal delivery (delivery was 04/15/15) which was c/b post partum hemorrhage requiring PRBC transfusion as well as endometritis requiring D&C and abx who was admitted to [facility] on 05/02/15 for sepsis due to pyelonephritis.  She was admitted to SDU by [facility]. On 9/19, she had back pain and chest pain followed by confusion and spike in fever. She was transferred to the ICU and was intubated for respiratory insufficiency. 2D ECHO was done on 05/06/15 which revealed EF 40-45% and cardiology was consulted.  Blood and urine cultures from admit returned positive for E.Coli. On 9/20, pt complained of back pain and then chest pain. She then became tachycardic (sinus tach with rates as high as 180's) and had intermittent hypotension (lowest SBP 80). She received morphine for chest pain and shortly thereafter she became confused and disoriented. In addition, her core temperature had spiked from 102F to 105F. PCCM was called and pt was transferred to the ICU where she was subsequently intubated for respiratory insufficiency. She was acutely encephalopathic and could not answer questions. She subsequently self extubated but remains with elevated RR and tachycardia.   Today she is alert and oriented but appears exhausted. She answers in one word answers. She was able to tell me the name of her newborn son, [name] who is with her mother. She denies chest pain or SOB. She denies orthopnea, PND, LE edema. She only has some right back pain.    STUDIES:  CT A / P 9/17 >>> right pyelo, no renal abscess. Periportal edema and diffuse GB wall thickening. Small volume perihepatic  ascites.  CXR 9/20 >>> small effusions. VQ Scan 9/20 >>> low prob for PE. LE duplex 9/20 >>> negative for DVT. CTA CHEST>>> No evidence of acute aortic dissection or PE. Moderate right and small left layering pleural effusions. No associated pulmonary edema  SIGNIFICANT EVENTS: 8/30 - spontaneous vaginal delivery. 9/17 - admitted with sepsis due to pyelo. 9/20 - chest pain, back pain, respiratory insufficiency. Transferred to ICU and intubated, CVL placed. 9/21- self extubated, high RR remains  Past Medical History  Diagnosis Date  . Anemia      Past Surgical History  Procedure Laterality Date  . Dilation and evacuation N/A 04/22/2015    Procedure: DILATATION AND EVACUATION;  Surgeon: Adam Phenix, MD;  Location: [facility];  Service: Gynecology;  Laterality: N/A;    No Known Allergies  I have reviewed the patient's current medications . antiseptic oral rinse  7 mL Mouth Rinse QID  . cefTRIAXone (ROCEPHIN)  IV  2 g Intravenous Q24H  . chlorhexidine gluconate  15 mL Mouth Rinse BID  . enoxaparin (LOVENOX) injection  40 mg Subcutaneous Daily  . furosemide  10 mg Intravenous Q12H  . lactobacillus  1 g Oral TID WC  . metoprolol tartrate  12.5 mg Oral BID  . pantoprazole (PROTONIX) IV  40 mg Intravenous Daily  . potassium chloride  40 mEq Oral Once  . potassium phosphate IVPB (mmol)  30 mmol Intravenous Once     acetaminophen, fentaNYL (SUBLIMAZE) injection, metoprolol, ondansetron (ZOFRAN) IV, sodium chloride  Prior to Admission medications   Medication  Sig Start Date End Date Taking? Authorizing Provider  etonogestrel (NEXPLANON) 68 MG IMPL implant 1 each by Subdermal route once. Implanted 04/16/15   Yes Historical Provider, MD  ferrous sulfate (FERROUSUL) 325 (65 FE) MG tablet Take 1 tablet (325 mg total) by mouth 2 (two) times daily. Patient taking differently: Take 325 mg by mouth daily with breakfast.  04/17/15  Yes Kathrynn Running, MD  ibuprofen (ADVIL,MOTRIN) 600 MG  tablet Take 1 tablet (600 mg total) by mouth every 6 (six) hours. Patient not taking: Reported on 05/03/2015 04/17/15   Kathrynn Running, MD  oxyCODONE-acetaminophen (PERCOCET/ROXICET) 5-325 MG per tablet Take 1-2 tablets by mouth every 3 (three) hours as needed for severe pain (moderate to severe pain (when tolerating fluids)). Patient not taking: Reported on 05/03/2015 04/23/15   Adam Phenix, MD  Prenatal Vit-Fe Fumarate-FA (PRENATAL COMPLETE) 14-0.4 MG TABS Take as directed Patient not taking: Reported on 04/21/2015 02/18/15   Willodean Rosenthal, MD     Social History   Social History  . Marital Status: Single    Spouse Name: N/A  . Number of Children: N/A  . Years of Education: N/A   Occupational History  . Not on file.   Social History Main Topics  . Smoking status: Former Smoker -- 0.00 packs/day    Types: Cigars  . Smokeless tobacco: Never Used  . Alcohol Use: No     Comment: occassionally  . Drug Use: No  . Sexual Activity: Yes    Birth Control/ Protection: None   Other Topics Concern  . Not on file   Social History Narrative    Family Status  Relation Status Death Age  . Mother Alive    Family History  Problem Relation Age of Onset  . Cancer Mother      ROS:  Full 14 point review of systems complete and found to be negative unless listed above.  Physical Exam: Blood pressure 123/51, pulse 126, temperature 98.7 F (37.1 C), temperature source Oral, resp. rate 34, height  (1.6 m), weight 58.3 kg (128 lb 8.5 oz), SpO2 100 %, currently breastfeeding.   General: Young AA female-extubated, alert but sleepy Neuro: Alert, follows commands. HEENT: [place]/AT. Sclerae anicteric. Cardiovascular: RRR, no M/R/G.  Lungs: Respirations even and mild labored. CTA bilaterally Abdomen: BS x 4, soft, NT/ND.  Musculoskeletal: No gross deformities, no edema.  Skin: Intact, warm, no rashes.  Labs:   Lab Results  Component Value Date   WBC 5.1 05/07/2015   HGB 7.0*  05/07/2015   HCT 21.4* 05/07/2015   MCV 77.0* 05/07/2015   PLT 89* 05/07/2015    Recent Labs  05/06/15 1907  INR 1.45    Recent Labs Lab 05/06/15 0310 05/07/15 0459  NA 136 137  K 3.9 3.4*  CL 113* 108  CO2 17* 22  BUN 7 5*  CREATININE 1.18* 1.06*  CALCIUM 7.8* 7.4*  PROT 4.7*  --   BILITOT 2.0*  --   ALKPHOS 136*  --   ALT 14  --   AST 24  --   GLUCOSE 107* 95  ALBUMIN 1.6*  --    MAGNESIUM  Date Value Ref Range Status  05/07/2015 1.4* 1.7 - 2.4 mg/dL Final    Recent Labs  57/84/69 1437 05/06/15 1907 05/07/15 0051  TROPONINI 0.09* 0.04* 0.03    LIPASE  Date/Time Value Ref Range Status  05/03/2015 06:20 PM 14* 22 - 51 U/L Final   TSH  Date/Time Value Ref Range  Status  05/05/2015 02:45 PM 0.338* 0.350 - 4.500 uIU/mL Final   No results found for: VITAMINB12, FOLATE, FERRITIN, TIBC, IRON, RETICCTPCT  Echo: 05/06/2015 LV EF: 40- 45% Study Conclusions - Left ventricle: The cavity size was normal. Systolic function was mildly to moderately reduced. The estimated ejection fraction was in the range of 40% to 45%. Wall motion was normal; there were no regional wall motion abnormalities. - Mitral valve: Valve area by continuity equation (using LVOT flow): 2.85 cm^2. - Right ventricle: Poorly visualized. - Pulmonary arteries: PA peak pressure: 31 mm Hg (S). - Pericardium, extracardiac: A trivial pericardial effusion was identified posterior to the heart.  ECG:  HR 145: Sinus tachycardia  Radiology:  Dg Chest 2 View  05/06/2015   CLINICAL DATA:  Pneumonia affecting pregnancy. Feeling better today.  EXAM: CHEST  2 VIEW  COMPARISON:  05/03/2015  FINDINGS: Decreasing lung volumes with increasing bibasilar opacities, likely atelectasis. Possible small effusions. Heart size is borderline. No acute bony abnormality.  IMPRESSION: Worsening aeration of the lungs with increasing bibasilar opacities, likely atelectasis.  Suspect small effusions.    Electronically Signed   By: Charlett Nose M.D.   On: 05/06/2015 09:07   Nm Pulmonary Perfusion  05/06/2015   CLINICAL DATA:  Recent postpartum patient was actively breastfeeding with right-sided abdominal and back pain.  EXAM: NUCLEAR MEDICINE VENTILATION AND PERFUSION SCAN  TECHNIQUE: Perfusion images were obtained in multiple projections after intravenous injection of radiopharmaceutical.  RADIOPHARMACEUTICALS:  3 mCi Tc43m MAA IV  COMPARISON:  Chest radiograph 05/06/2015  FINDINGS: Profusion images demonstrate no profusion abnormality. Radiotracer is homogeneously distributed throughout the lungs bilaterally.  IMPRESSION: Very low probability for pulmonary embolism (PE absent).   Electronically Signed   By: Annia Belt M.D.   On: 05/06/2015 11:30   US Transvaginal Non-ob  05/07/2015   CLINICAL DATA:  Retained products of conception.  Septic shock.  EXAM: TRANSABDOMINAL AND TRANSVAGINAL ULTRASOUND OF PELVIS  TECHNIQUE: Both transabdominal and transvaginal ultrasound examinations of the pelvis were performed. Transabdominal technique was performed for global imaging of the pelvis including uterus, ovaries, adnexal regions, and pelvic cul-de-sac. It was necessary to proceed with endovaginal exam following the transabdominal exam to visualize the uterus and ovaries.  COMPARISON:  CT 05/06/2015.  FINDINGS: Uterus  Measurements: 11.6 x 5.2 x 6.9 cm. No fibroids or other mass visualized.  Endometrium  Thickness: 6.7 mm. No focal abnormality identified. No retained products of conception noted. Trace endometrial fluid.  Right ovary  Measurements: 3.1 x 2.1 x 2.9 cm. Normal appearance/no adnexal mass.  Left ovary  Measurements: 2.7 x 2.0 x 2.9 cm. Normal appearance/no adnexal mass.  Other findings  Large amount of free pelvic fluid.  IMPRESSION: 1. Large amount of free pelvic fluid. 2. No evidence of endometrial thickening or retained products of conception. Trace residual endometrial fluid.   Electronically Signed    By: Maisie Fus  Register   On: 05/07/2015 07:39   US Pelvis Complete  05/07/2015   CLINICAL DATA:  Retained products of conception.  Septic shock.  EXAM: TRANSABDOMINAL AND TRANSVAGINAL ULTRASOUND OF PELVIS  TECHNIQUE: Both transabdominal and transvaginal ultrasound examinations of the pelvis were performed. Transabdominal technique was performed for global imaging of the pelvis including uterus, ovaries, adnexal regions, and pelvic cul-de-sac. It was necessary to proceed with endovaginal exam following the transabdominal exam to visualize the uterus and ovaries.  COMPARISON:  CT 05/06/2015.  FINDINGS: Uterus  Measurements: 11.6 x 5.2 x 6.9 cm. No fibroids or other mass  visualized.  Endometrium  Thickness: 6.7 mm. No focal abnormality identified. No retained products of conception noted. Trace endometrial fluid.  Right ovary  Measurements: 3.1 x 2.1 x 2.9 cm. Normal appearance/no adnexal mass.  Left ovary  Measurements: 2.7 x 2.0 x 2.9 cm. Normal appearance/no adnexal mass.  Other findings  Large amount of free pelvic fluid.  IMPRESSION: 1. Large amount of free pelvic fluid. 2. No evidence of endometrial thickening or retained products of conception. Trace residual endometrial fluid.   Electronically Signed   By: Thomas  Register   On: 05/07/2015 07:39   Dg Chest Port 1 View  05/07/2015   CLINICAL DATA:  Intubation.  EXAM: PORTABLE CHEST - 1 VIEW  COMPARISON:  CT 05/06/2015.  Chest x-ray 05/06/2015.  FINDINGS: Endotracheal tube and NG tube in stable position. Left IJ line in stable position. Heart size stable. Progressive diffuse bilateral pulmonary infiltrates and/or edema. Right pleural effusion again noted. No pneumothorax.  IMPRESSION: 1. Lines and tubes in stable position. 2. Progressive diffuse bilateral pulmonary infiltrates and/or edema. Right pleural effusion again noted.   Electronically Signed   By: Thomas  Register   On: 05/07/2015 07:49   Portable Chest Xray  05/06/2015   CLINICAL DATA:  Post  intubation and central line placement.  EXAM: PORTABLE CHEST - 1 VIEW  COMPARISON:  Earlier the same day.  FINDINGS: There has been interval intubation with the endotracheal tube overlying the tracheal air column and terminating at the level of the clavicular heads. Enteric catheter transverses the thorax, tip collimated off the image. Left-sided internal jugular approach central venous catheter terminates within the expected location of right atrium.  Cardiomediastinal silhouette is normal for portable technique. Mediastinal contours appear intact.  There is no evidence of pneumothorax. There are bilateral interstitial and alveolar opacities, with right pleural effusion. Left subpulmonic effusion is not excluded.  Osseous structures are without acute abnormality. Soft tissues are grossly normal.  IMPRESSION: Status post intubation with endotracheal tube in satisfactory position.  EntKKoreao[id number] [id number] [id number] Wynn [id number] A CHEST FINDINGS  Mediastinum: The patient is intubated. The tip of the endotracheal tube terminates just above the carina. A nasogastric tube is present and terminates in the gastric antrum. There is a left IJ approach central venous  catheter. The tip of the catheter terminates at the superior cavoatrial junction. Unremarkable thyroid gland and thoracic inlet.  Heart/Vascular: Conventional 3 vessel aortic arch. No aneurysm or dissection. Excellent opacification of the pulmonary arteries. No evidence of pulmonary embolus. The heart is within normal limits for size. No pericardial effusion.  Lungs/Pleura: Moderate right and small left layering pleural effusions with significant bilateral left lower lobe atelectasis. Superimposed infiltrate difficult to exclude.  Bones/Soft Tissues: No acute fracture or aggressive appearing lytic or blastic  osseous lesion.  Review of the MIP images confirms the above findings.  CTA ABDOMEN AND PELVIS FINDINGS  VASCULAR  Aorta: Normal caliber abdominal aorta. No evidence of aneurysm or dissection.  Celiac: The left hepatic artery is replaced to the left gastric artery.  SMA: Widely patent and unremarkable.  Renals: Single dominant renal arteries bilaterally which are widely patent and unremarkable.  IMA: Widely patent and unremarkable.  Inflow: Widely patent and unremarkable.  Proximal Outflow: Widely patent and unremarkable.  Veins: No focal venous abnormality identified.  NON-VASCULAR  Abdomen: A combination of streak artifact from the arms and arterial phase timing slightly limits evaluation of the abdomen and pelvis. The nasogastric tube terminates in the gastric antrum. Unremarkable CT appearance of the stomach, duodenum, spleen, adrenal glands and pancreas. The liver is normal in size. There is diffuse periportal edema and extensive thickening of the gallbladder wall. The gallbladder lumen itself is not distended. The portal veins remain patent.  Diffusely enlarged and edematous right kidney with patchy arterial phase enhancement is most consistent with severe pyelonephritis. Mild fullness of the right renal collecting system without true hydronephrosis. Minimal patchy enhancement of the left kidney favored to  be secondary to streak artifact rather than true parenchymal edema.  Small volume free fluid in the bilateral pericolic gutters and anatomic pelvis.  Pelvis: Free fluid layers within the pelvis. Hyperenhancing and post gravid appearance of the uterus. Foley catheter is present within the bladder.  Bones/Soft Tissues: Reticulation of the subcutaneous fat consistent with mild anasarca. No acute fracture or aggressive appearing lytic or blastic osseous lesion.  Review of the MIP images confirms the above findings.  IMPRESSION: CTA CHEST  1. No evidence of acute aortic dissection or other arterial abnormality. 2. No evidence of acute pulmonary embolus. 3. Moderate right and small left layering pleural effusions. No associated pulmonary edema. 4. Lobar atelectasis of the bilateral lower lobes. Superimposed infiltrate or aspiration is difficult to exclude entirely. 5. The tip of the endotracheal tube terminates above the carina. 6. The tip of the left IJ approach central venous catheter terminates at the superior cavoatrial junction.  CTA ABD/PELVIS  1. No evidence of acute aortic or other arterial abnormality. 2. Findings remain consistent with severe right-sided pyelonephritis. 3. There may be mild fullness of the right renal collecting system without true hydronephrosis. 4. Periportal edema, diffuse thickening of the gallbladder wall, subcutaneous anasarca and free fluid within the abdomen favored to be secondary to third spacing in the setting of sepsis and aggressive hydration. 5. The portal veins remain patent. 6. The bladder contains a moderate amount of urine despite the presence of the Foley catheter.   Electronically Signed   By: Malachy Moan M.D.   On: 05/06/2015 18:45   Ct Angio Abd/pel W/ And/or W/o  05/06/2015   EXAM: CT ANGIOGRAPHY CHEST, ABDOMEN AND PELVIS  TECHNIQUE: Multidetector CT imaging through the chest, abdomen and pelvis was performed using the standard protocol during bolus administration  of intravenous contrast. Multiplanar reconstructed images and MIPs were obtained and reviewed to evaluate the vascular anatomy.  CONTRAST:  OMNIPAQUE IOHEXOL 350 MG/ML SOLN  COMPARISON:  CT scan abdomen and pelvis 05/03/2015  FINDINGS: CTA CHEST FINDINGS  Mediastinum: The patient is intubated. The tip of the endotracheal tube terminates just above the carina. A nasogastric tube is present and terminates in the gastric antrum. There is a left IJ approach central venous catheter. The tip of the catheter terminates at the superior cavoatrial junction. Unremarkable thyroid  gland and thoracic inlet.  Heart/Vascular: Conventional 3 vessel aortic arch. No aneurysm or dissection. Excellent opacification of the pulmonary arteries. No evidence of pulmonary embolus. The heart is within normal limits for size. No pericardial effusion.  Lungs/Pleura: Moderate right and small left layering pleural effusions with significant bilateral left lower lobe atelectasis. Superimposed infiltrate difficult to exclude.  Bones/Soft Tissues: No acute fracture or aggressive appearing lytic or blastic osseous lesion.  Review of the MIP images confirms the above findings.  CTA ABDOMEN AND PELVIS FINDINGS  VASCULAR  Aorta: Normal caliber abdominal aorta. No evidence of aneurysm or dissection.  Celiac: The left hepatic artery is replaced to the left gastric artery.  SMA: Widely patent and unremarkable.  Renals: Single dominant renal arteries bilaterally which are widely patent and unremarkable.  IMA: Widely patent and unremarkable.  Inflow: Widely patent and unremarkable.  Proximal Outflow: Widely patent and unremarkable.  Veins: No focal venous abnormality identified.  NON-VASCULAR  Abdomen: A combination of streak artifact from the arms and arterial phase timing slightly limits evaluation of the abdomen and pelvis. The nasogastric tube terminates in the gastric antrum. Unremarkable CT appearance of the stomach, duodenum, spleen, adrenal  glands and pancreas. The liver is normal in size. There is diffuse periportal edema and extensive thickening of the gallbladder wall. The gallbladder lumen itself is not distended. The portal veins remain patent.  Diffusely enlarged and edematous right kidney with patchy arterial phase enhancement is most consistent with severe pyelonephritis. Mild fullness of the right renal collecting system without true hydronephrosis. Minimal patchy enhancement of the left kidney favored to be secondary to streak artifact rather than true parenchymal edema.  Small volume free fluid in the bilateral pericolic gutters and anatomic pelvis.  Pelvis: Free fluid layers within the pelvis. Hyperenhancing and post gravid appearance of the uterus. Foley catheter is present within the bladder.  Bones/Soft Tissues: Reticulation of the subcutaneous fat consistent with mild anasarca. No acute fracture or aggressive appearing lytic or blastic osseous lesion.  Review of the MIP images confirms the above findings.  IMPRESSION: CTA CHEST  1. No evidence of acute aortic dissection or other arterial abnormality. 2. No evidence of acute pulmonary embolus. 3. Moderate right and small left layering pleural effusions. No associated pulmonary edema. 4. Lobar atelectasis of the bilateral lower lobes. Superimposed infiltrate or aspiration is difficult to exclude entirely. 5. The tip of the endotracheal tube terminates above the carina. 6. The tip of the left IJ approach central venous catheter terminates at the superior cavoatrial junction.  CTA ABD/PELVIS  1. No evidence of acute aortic or other arterial abnormality. 2. Findings remain consistent with severe right-sided pyelonephritis. 3. There may be mild fullness of the right renal collecting system without true hydronephrosis. 4. Periportal edema, diffuse thickening of the gallbladder wall, subcutaneous anasarca and free fluid within the abdomen favored to be secondary to third spacing in the setting  of sepsis and aggressive hydration. 5. The portal veins remain patent. 6. The bladder contains a moderate amount of urine despite the presence of the Foley catheter.   Electronically Signed   By: Malachy Moan M.D.   On: 05/06/2015 18:45    ASSESSMENT AND PLAN:    Active Problems:   Sepsis   Acute respiratory failure with hypoxemia   Septic shock   Metabolic acidosis   Acute pyelonephritis   Severe sepsis   Bacteremia, escherichia coli   Fever and chills   Acute renal failure syndrome   Hypokalemia  Hypomagnesemia   Sinus tachycardia   Endometritis complicating pregnancy   Retained products of conception after delivery with complications   Normocytic anemia   Braylynn Lewing is a 19 y.o. female with a PMH of anemia as well as recent vaginal delivery (delivery was 04/15/15) which was c/b post partum hemorrhage requiring PRBC transfusion as well as endometritis requiring D&C and abx who was admitted to [facility] on 05/02/15 for sepsis due to pyelonephritis.  She was admitted to SDU by [facility]. On 9/19, she had back pain and chest pain followed by confusion and spike in fever. She was transferred to the ICU and was intubated for respiratory insufficiency. 2D ECHO was done on 05/06/15 which revealed EF 40-45% and cardiology was consulted.  Cardiomyopathy etiology unknown - peri partum vs sepsis -- 2D ECHO with EF 40-45%. No pericardial effusion or signs of myopericarditis. Troponin negative. -- CXR with pulm edema. Given 10mg  IV Lasix today.  -- Continue lopressor 12.5mg  BID -- Would continue lasix and BB. See MD note for final recommendation  Sinus Tachycardia- due to acute illness  -- Continue Lopressor 12.5mg  BID  Bacteremia due to UTI / pyelonephritis  -- BCx's positive for GNR's with E.Coli  -- Abx per PCCM  Acute respiratory failure -- 9/21 self-extubated. Now resolved.  Anemia - microcytic; chronic. -- Transfuse 1 Unit PRBC if less 7. Continue to monitor  Thrombocytopenia  without evidence of bleeding (sepsis) and dilution -- Monitor platelet counts. 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  Acute metabolic encephalopathy- improved   Signed: Janetta Hora, PA-C 05/07/2015 3:55 PM  Pager [phone]  Co-Sign MD  Patient examined chart reviewed Limited history from patient. 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  HR improved continue beta blocker. Agree with lasix after volume resuscitation as she has 3rd spaced  She has a Systolic flow murmur and left IJ catheter in place.  May need methimazole for hyperthyroidism.  This anemia and sepsis contributing to sinus tachycardia rather than low output.    Charlton Haws

## 2015-05-07 NOTE — Progress Notes (Signed)
Pt self extubated, restraints remained in place. Respiratory and MD Tyson Alias called. Pt placed on nonrebreather, o2 sats 100. No distress noted at this time.

## 2015-05-08 DIAGNOSIS — N178 Other acute kidney failure: Secondary | ICD-10-CM

## 2015-05-08 LAB — T3, FREE: T3, Free: 1.8 pg/mL — ABNORMAL LOW (ref 2.3–5.0)

## 2015-05-08 LAB — CULTURE, BLOOD (ROUTINE X 2): Culture: NO GROWTH

## 2015-05-08 LAB — CBC
HCT: 23.5 % — ABNORMAL LOW (ref 36.0–46.0)
HEMOGLOBIN: 7.8 g/dL — AB (ref 12.0–15.0)
MCH: 25.5 pg — AB (ref 26.0–34.0)
MCHC: 33.2 g/dL (ref 30.0–36.0)
MCV: 76.8 fL — ABNORMAL LOW (ref 78.0–100.0)
Platelets: 73 10*3/uL — ABNORMAL LOW (ref 150–400)
RBC: 3.06 MIL/uL — AB (ref 3.87–5.11)
RDW: 16.6 % — ABNORMAL HIGH (ref 11.5–15.5)
WBC: 7.1 10*3/uL (ref 4.0–10.5)

## 2015-05-08 LAB — BASIC METABOLIC PANEL
ANION GAP: 5 (ref 5–15)
BUN: <5 mg/dL — ABNORMAL LOW (ref 6–20)
CALCIUM: 7.4 mg/dL — AB (ref 8.9–10.3)
CO2: 26 mmol/L (ref 22–32)
Chloride: 105 mmol/L (ref 101–111)
Creatinine, Ser: 0.92 mg/dL (ref 0.44–1.00)
Glucose, Bld: 97 mg/dL (ref 65–99)
POTASSIUM: 3.2 mmol/L — AB (ref 3.5–5.1)
Sodium: 136 mmol/L (ref 135–145)

## 2015-05-08 LAB — PROCALCITONIN: Procalcitonin: 86.65 ng/mL

## 2015-05-08 LAB — HIV ANTIBODY (ROUTINE TESTING W REFLEX): HIV Screen 4th Generation wRfx: NONREACTIVE

## 2015-05-08 LAB — PREPARE RBC (CROSSMATCH)

## 2015-05-08 MED ORDER — POTASSIUM CHLORIDE 10 MEQ/50ML IV SOLN
10.0000 meq | INTRAVENOUS | Status: AC
  Administered 2015-05-08 (×4): 10 meq via INTRAVENOUS
  Filled 2015-05-08 (×4): qty 50

## 2015-05-08 MED ORDER — POTASSIUM CHLORIDE 10 MEQ/100ML IV SOLN
10.0000 meq | INTRAVENOUS | Status: AC
  Administered 2015-05-08 (×2): 10 meq via INTRAVENOUS
  Filled 2015-05-08 (×2): qty 100

## 2015-05-08 MED ORDER — FUROSEMIDE 10 MG/ML IJ SOLN
10.0000 mg | Freq: Two times a day (BID) | INTRAMUSCULAR | Status: AC
  Administered 2015-05-08 (×2): 10 mg via INTRAVENOUS
  Filled 2015-05-08 (×2): qty 2

## 2015-05-08 MED ORDER — POTASSIUM CHLORIDE CRYS ER 20 MEQ PO TBCR
30.0000 meq | EXTENDED_RELEASE_TABLET | ORAL | Status: DC
  Administered 2015-05-08: 30 meq via ORAL
  Filled 2015-05-08 (×2): qty 1

## 2015-05-08 NOTE — Progress Notes (Signed)
PULMONARY / CRITICAL CARE MEDICINE   Name: Deanna Sullivan MRN: [id number] DOB: 1995-12-31    ADMISSION DATE:  05/03/2015 CONSULTATION DATE:  05/06/15  REFERRING MD :  Joseph Art  CHIEF COMPLAINT:  Respiratory Distress  INITIAL PRESENTATION:  19 y.o. F who is two weeks postpartum from spontaneous vaginal delivery (04/15/15), brought to [facility] ED 05/03/15 with back and abd pain.  She was found to have sepsis due to pyelonephritis.  She was admitted to SDU by [facility].  On 9/19, she had back pain and chest pain followed by confusion and spike in fever.  She was transferred to the ICU and was intubated for respiratory insufficiency.   STUDIES:  CT A / P 9/17 >>> right pyelo, no renal abscess.  Periportal edema and diffuse GB wall thickening. Small volume perihepatic ascites.  CXR 9/20 >>> small effusions. VQ Scan 9/20 >>> low prob for PE. LE duplex 9/20 >>> negative for DVT. ECHO 9/21 >>> EF 40-45%  SIGNIFICANT EVENTS: 8/30 - spontaneous vaginal delivery. 9/17 - admitted with sepsis due to pyelo. 9/20 - chest pain, back pain, respiratory insufficiency.  Transferred to ICU and intubated, CVL placed. 9/21- self extubated, high RR remains 9/22- Improved, HR/RR decreased  SUBJECTIVE: rr wnl, no distress, neg balance noted  VITAL SIGNS: Temp:  [98.2 F (36.8 C)-102.9 F (39.4 C)] 98.7 F (37.1 C) (09/22 0741) Pulse Rate:  [70-129] 79 (09/22 0900) Resp:  [19-39] 32 (09/22 0900) BP: (108-129)/(48-96) 120/78 mmHg (09/22 0900) SpO2:  [79 %-100 %] 100 % (09/22 0900) HEMODYNAMICS: CVP:  [7 mmHg-13 mmHg] 7 mmHg VENTILATOR SETTINGS:   INTAKE / OUTPUT: Intake/Output      09/21 0701 - 09/22 0700 09/22 0701 - 09/23 0700   P.O. 240 120   I.V. (mL/kg) 610 (10.5)    Blood 270    NG/GT     IV Piggyback 610 100   Total Intake(mL/kg) 1730 (29.7) 220 (3.8)   Urine (mL/kg/hr) 4450 (3.2)    Emesis/NG output 0 (0)    Total Output 4450     Net -2720 +220        Emesis Occurrence 1 x      PHYSICAL  EXAMINATION: General: Young AA female in bed, NAD.  Neuro: A&Ox4, follows commands HEENT: [place]/AT Cardiovascular: RRR, no M/R/G.  Lungs: Respirations even and mild labored.  CTA bilaterally Abdomen: BS x 4, soft, NT/ND.  Musculoskeletal: No gross deformities, no edema.  Skin: Intact, warm, no rashes.  LABS:  CBC  Recent Labs Lab 05/07/15 0459 05/07/15 1955 05/08/15 0315  WBC 5.1 8.3 7.1  HGB 7.0* 6.5* 7.8*  HCT 21.4* 19.8* 23.5*  PLT 89* 85* 73*   Coag's  Recent Labs Lab 05/03/15 1820 05/06/15 1907  APTT 34 23*  INR 1.66* 1.45   BMET  Recent Labs Lab 05/07/15 0459 05/07/15 1955 05/08/15 0315  NA 137 136 136  K 3.4* 3.1* 3.2*  CL 108 104 105  CO2 BUN 5* <5* <5*  CREATININE 1.06* 1.08* 0.92  GLUCOSE 95 121* 97   Electrolytes  Recent Labs Lab 05/05/15 1445  05/07/15 0459 05/07/15 1955 05/08/15 0315  CALCIUM 7.6*  < > 7.4* 7.5* 7.4*  MG 2.7*  --  1.4* 2.5*  --   PHOS  --   --  1.5* 5.0*  --   < > = values in this interval not displayed. Sepsis Markers  Recent Labs Lab 05/04/15 0600 05/06/15 0951 05/06/15 1437 05/07/15 0459 05/08/15 0315  LATICACIDVEN 1.6  1.3 1.2  --   --   PROCALCITON  --   --  36.86 123.59 86.65   ABG  Recent Labs Lab 05/06/15 1451 05/07/15 0309 05/07/15 0744  PHART 7.250* 7.574* 7.447  PCO2ART 33.7* 25.9* 31.5*  PO2ART 58.0* 465.0* 56.0*   Liver Enzymes  Recent Labs Lab 05/04/15 0600 05/05/15 0225 05/06/15 0310  AST 28 19 24   ALT 14 12* 14  ALKPHOS 149* 123 136*  BILITOT 2.3* 2.0* 2.0*  ALBUMIN 1.9* 1.7* 1.6*   Cardiac Enzymes  Recent Labs Lab 05/06/15 1437 05/06/15 1907 05/07/15 0051  TROPONINI 0.09* 0.04* 0.03   Glucose  Recent Labs Lab 05/06/15 1609  GLUCAP 110*    Imaging No results found.   ASSESSMENT / PLAN:  INFECTIOUS A:   Bacteremia - BCx's positive for GNR's with E.Coli speciation. UTI / pyelonephritis - E.coli. P:   BCx2 9/17 >>> GNR'S >>> E.COLI (pan  sens). UCx >>> E.COLI (pan sens). Abx: Vanc 9/20>>9/21 Abx: Ceftriaxone, start date 9/21>>> Abx: Zosyn, start date 9/20, day 2/x.  Maintain ceftriaxone, stop date is in   PULMONARY OETT 9/20 >>> 9/21 self-extubated A: VDRF due to inability to protect airway during distress 9/20. pulm edema? Etiology? cariomyoapthy P:   Nasal cannula  Neg balance goals successful and improved  CARDIOVASCULAR CVL L IJ 9/20 >>> A:  Sinus tachycardia resolved cardiomyopathy etiology unknown - peri partum? Sepsis? P:  Lopressor PRN to maintain HR < 115. Cont BB per cards, tolerated Echo repeat in 2-4 weeks, cards note noted Avoid tachy  RENAL A:   Non AG metabolic acidosis > Resolved  AKI Hypokalemia P:   KVO Replace K IV as did not tolerate oral Neg balance Repeat BMP in AM  GASTROINTESTINAL A:   Hypoalbumineia. Hyperbilirubinemia. GI prophylaxis. Nutrition. P:    LFTs in AM  SUP: Pantoprazole. Nutrition consult for diet education / calorie counts.  HEMATOLOGIC A:   Anemia - microcytic; chronic. Thrombocytopenia without evidence of bleeding (sepsis) and dilution VTE Prophylaxis. P:  Transfuse PRBC if less 7 Monitor platelet counts. SCD's Drop in hgn , dcLovenox CBC in AM. Maintain lasix  ENDOCRINE A:   Sick Euthyroid  P:   Repeat TSH / free T4 in a few weeks, defer any treatment for now.  NEUROLOGIC A:   Acute metabolic encephalopathy >improved  P:   Resolved Mobilize her Get PT  Family updated: No family at bedside at this time   Simonne Martinet ACNP-BC [facility] Pulmonary/Critical Care Pager # [phone] OR # [phone] if no answer  05/08/2015, 9:47 AM  STAFF NOTE: I, Rory Percy, MD FACP have personally reviewed patient's available data, including medical history, events of note, physical examination and test results as part of my evaluation. I have discussed with resident/NP and other care providers such as pharmacist, RN and RRT. In addition, I  personally evaluated patient and elicited key findings of: much much much improved, CTA, tolerated neg balance well, maintain again next 24 hr, cards evaluated, repeat echo in 2-4 weeks, tele maintain, cbc to follow after blood, T3 indicated sick euthyroid NOT hyperthyroidism, to tele, k supp IV needed, ABX ceftriaxone to stop date, desat noted, needs further lasix  To sdu, triad  Mcarthur Rossetti. Tyson Alias, MD, FACP Pgr: [phone] [facility] 05/08/2015 10:52 AM

## 2015-05-08 NOTE — Progress Notes (Signed)
Called to patients room after complaints of "severe abdominal pain". On arrival, patient had large bowel movement in chair and on floor. Stated that her abdominal pain had subsided. Assisted to bed and cleaned. Explained to patient to call for assistance to use bathroom. Will monitor.

## 2015-05-08 NOTE — Progress Notes (Signed)
[place] ICU Electrolyte Replacement Protocol  Patient Name: Deanna Sullivan DOB: [birth year] MRN: [id number]  Date of Service  05/08/2015   HPI/Events of Note    Recent Labs Lab 05/04/15 1900  05/05/15 1445 05/06/15 0310 05/07/15 0459 05/07/15 1955 05/08/15 0315  NA  --   < > 139 136 137 136 136  K  --   < > 3.5 3.9 3.4* 3.1* 3.2*  CL  --   < > 114* 113* 108 104 105  CO2  --   < > 18* 17* _0 GLUCOSE  --   < > 92 107* 95 121* 97  BUN  --   < > 7 7 5* <5* <5*  CREATININE  --   < > 1.16* 1.18* 1.06* 1.08* 0.92  CALCIUM  --   < > 7.6* 7.8* 7.4* 7.5* 7.4*  MG 1.1*  --  2.7*  --  1.4* 2.5*  --   PHOS  --   --   --   --  1.5* 5.0*  --   < > = values in this interval not displayed.  Estimated Creatinine Clearance: 81.4 mL/min (by C-G formula based on Cr of 0.92).  Intake/Output      09/21 0701 - 09/22 0700   P.O. 240   I.V. (mL/kg) 610 (10.5)   Blood 270   IV Piggyback 560   Total Intake(mL/kg) 1680 (28.8)   Urine (mL/kg/hr) 4250 (3)   Total Output 4250   Net -2570        - I/O DETAILED x24h    Total I/O In: 520 [P.O.:240; I.V.:10; Blood:270] Out: 1600 [Urine:1600] - I/O THIS SHIFT    ASSESSMENT   eICURN Interventions   Electrolyte protocol criteria met. Irregular labs replaced per protocol. MD notified.   ASSESSMENT: MAJOR ELECTROLYTE    Deanna Sullivan 05/08/2015, 5:00 AM

## 2015-05-08 NOTE — Progress Notes (Signed)
eLink Physician-Brief Progress Note Patient Name: Deanna Sullivan DOB: [birth year] MRN: [id number]   Date of Service  05/08/2015  HPI/Events of Note    eICU Interventions  Morning labs ordered     Intervention Category Minor Interventions: Other:  Billy Fischer 05/08/2015, 2:39 AM

## 2015-05-08 NOTE — Progress Notes (Signed)
Patient ID: Deanna Sullivan, female   DOB: [birth year], 19 y.o.   MRN: [id number]    Subjective:  Lethargic no complaints  Objective:  Filed Vitals:   05/08/15 0700 05/08/15 0741 05/08/15 0800 05/08/15 0900  BP: 117/76  116/72 120/78  Pulse: 83  80 79  Temp:  98.7 F (37.1 C)    TempSrc:  Oral    Resp: 30  33 32  Height:      Weight:      SpO2: 97%  98% 100%    Intake/Output from previous day:  Intake/Output Summary (Last 24 hours) at 05/08/15 1191 Last data filed at 05/08/15 0851  Gross per 24 hour  Intake   1650 ml  Output   4000 ml  Net  -2350 ml    Physical Exam: Affect flat  Young black female in no distress  HEENT: normal Neck supple with no adenopathy JVP normal no bruits no thyromegaly Lungs crackles at base no wheezing and good diaphragmatic motion Heart:  S1/S2 no murmur, no rub, gallop or click PMI normal Abdomen: benighn, BS positve, no tenderness, no AAA no bruit.  No HSM or HJR Distal pulses intact with no bruits No edema Neuro non-focal Skin warm and dry No muscular weakness Foley    Lab Results: Basic Metabolic Panel:  Recent Labs  47/82/95 0459 05/07/15 1955 05/08/15 0315  NA 137 136 136  K 3.4* 3.1* 3.2*  CL 108 104 105  CO2 GLUCOSE 95 121* 97  BUN 5* <5* <5*  CREATININE 1.06* 1.08* 0.92  CALCIUM 7.4* 7.5* 7.4*  MG 1.4* 2.5*  --   PHOS 1.5* 5.0*  --    Liver Function Tests:  Recent Labs  05/06/15 0310  AST 24  ALT 14  ALKPHOS 136*  BILITOT 2.0*  PROT 4.7*  ALBUMIN 1.6*   No results for input(s): LIPASE, AMYLASE in the last 72 hours. CBC:  Recent Labs  05/07/15 1955 05/08/15 0315  WBC 8.3 7.1  HGB 6.5* 7.8*  HCT 19.8* 23.5*  MCV 75.3* 76.8*  PLT 85* 73*   Cardiac Enzymes:  Recent Labs  05/06/15 1437 05/06/15 1907 05/07/15 0051  TROPONINI 0.09* 0.04* 0.03   Thyroid Function Tests:  Recent Labs  05/05/15 1445 05/07/15 1245  TSH 0.338*  --   T3FREE  --  1.8*    Imaging: Nm Pulmonary  Perfusion  05/06/2015   CLINICAL DATA:  Recent postpartum patient was actively breastfeeding with right-sided abdominal and back pain.  EXAM: NUCLEAR MEDICINE VENTILATION AND PERFUSION SCAN  TECHNIQUE: Perfusion images were obtained in multiple projections after intravenous injection of radiopharmaceutical.  RADIOPHARMACEUTICALS:  3 mCi Tc44m MAA IV  COMPARISON:  Chest radiograph 05/06/2015  FINDINGS: Profusion images demonstrate no profusion abnormality. Radiotracer is homogeneously distributed throughout the lungs bilaterally.  IMPRESSION: Very low probability for pulmonary embolism (PE absent).   Electronically Signed   By: Annia Belt M.D.   On: 05/06/2015 11:30   US Transvaginal Non-ob  05/07/2015   CLINICAL DATA:  Retained products of conception.  Septic shock.  EXAM: TRANSABDOMINAL AND TRANSVAGINAL ULTRASOUND OF PELVIS  TECHNIQUE: Both transabdominal and transvaginal ultrasound examinations of the pelvis were performed. Transabdominal technique was performed for global imaging of the pelvis including uterus, ovaries, adnexal regions, and pelvic cul-de-sac. It was necessary to proceed with endovaginal exam following the transabdominal exam to visualize the uterus and ovaries.  COMPARISON:  CT 05/06/2015.  FINDINGS: Uterus  Measurements: 11.6 x 5.2 x 6.9 cm.  No fibroids or other mass visualized.  Endometrium  Thickness: 6.7 mm. No focal abnormality identified. No retained products of conception noted. Trace endometrial fluid.  Right ovary  Measurements: 3.1 x 2.1 x 2.9 cm. Normal appearance/no adnexal mass.  Left ovary  Measurements: 2.7 x 2.0 x 2.9 cm. Normal appearance/no adnexal mass.  Other findings  Large amount of free pelvic fluid.  IMPRESSION: 1. Large amount of free pelvic fluid. 2. No evidence of endometrial thickening or retained products of conception. Trace residual endometrial fluid.   Electronically Signed   By: Maisie Fus  Register   On: 05/07/2015 07:39   US Pelvis Complete  05/07/2015    CLINICAL DATA:  Retained products of conception.  Septic shock.  EXAM: TRANSABDOMINAL AND TRANSVAGINAL ULTRASOUND OF PELVIS  TECHNIQUE: Both transabdominal and transvaginal ultrasound examinations of the pelvis were performed. Transabdominal technique was performed for global imaging of the pelvis including uterus, ovaries, adnexal regions, and pelvic cul-de-sac. It was necessary to proceed with endovaginal exam following the transabdominal exam to visualize the uterus and ovaries.  COMPARISON:  CT 05/06/2015.  FINDINGS: Uterus  Measurements: 11.6 x 5.2 x 6.9 cm. No fibroids or other mass visualized.  Endometrium  Thickness: 6.7 mm. No focal abnormality identified. No retained products of conception noted. Trace endometrial fluid.  Right ovary  Measurements: 3.1 x 2.1 x 2.9 cm. Normal appearance/no adnexal mass.  Left ovary  Measurements: 2.7 x 2.0 x 2.9 cm. Normal appearance/no adnexal mass.  Other findings  Large amount of free pelvic fluid.  IMPRESSION: 1. Large amount of free pelvic fluid. 2. No evidence of endometrial thickening or retained products of conception. Trace residual endometrial fluid.   Electronically Signed   By: Maisie Fus  Register   On: 05/07/2015 07:39   Dg Chest Port 1 View  05/07/2015   CLINICAL DATA:  Intubation.  EXAM: PORTABLE CHEST - 1 VIEW  COMPARISON:  CT 05/06/2015.  Chest x-ray 05/06/2015.  FINDINGS: Endotracheal tube and NG tube in stable position. Left IJ line in stable position. Heart size stable. Progressive diffuse bilateral pulmonary infiltrates and/or edema. Right pleural effusion again noted. No pneumothorax.  IMPRESSION: 1. Lines and tubes in stable position. 2. Progressive diffuse bilateral pulmonary infiltrates and/or edema. Right pleural effusion again noted.   Electronically Signed   By: Maisie Fus  Register   On: 05/07/2015 07:49   Portable Chest Xray  05/06/2015   CLINICAL DATA:  Post intubation and central line placement.  EXAM: PORTABLE CHEST - 1 VIEW  COMPARISON:   Earlier the same day.  FINDINGS: There has been interval intubation with the endotracheal tube overlying the tracheal air column and terminating at the level of the clavicular heads. Enteric catheter transverses the thorax, tip collimated off the image. Left-sided internal jugular approach central venous catheter terminates within the expected location of right atrium.  Cardiomediastinal silhouette is normal for portable technique. Mediastinal contours appear intact.  There is no evidence of pneumothorax. There are bilateral interstitial and alveolar opacities, with right pleural effusion. Left subpulmonic effusion is not excluded.  Osseous structures are without acute abnormality. Soft tissues are grossly normal.  IMPRESSION: Status post intubation with endotracheal tube in satisfactory position.  Enteric catheter collimated off the image.  Central venous catheter with tip in the expected location of the right atrium.  Appearance of the lungs suggestive of pulmonary edema, with right pleural effusion. Left subpulmonic effusion is not excluded.   Electronically Signed   By: Ted Mcalpine M.D.   On: 05/06/2015 15:02  Ct Angio Chest Aorta W/cm &/or Wo/cm  05/06/2015   EXAM: CT ANGIOGRAPHY CHEST, ABDOMEN AND PELVIS  TECHNIQUE: Multidetector CT imaging through the chest, abdomen and pelvis was performed using the standard protocol during bolus administration of intravenous contrast. Multiplanar reconstructed images and MIPs were obtained and reviewed to evaluate the vascular anatomy.  CONTRAST:  OMNIPAQUE IOHEXOL 350 MG/ML SOLN  COMPARISON:  CT scan abdomen and pelvis 05/03/2015  FINDINGS: CTA CHEST FINDINGS  Mediastinum: The patient is intubated. The tip of the endotracheal tube terminates just above the carina. A nasogastric tube is present and terminates in the gastric antrum. There is a left IJ approach central venous catheter. The tip of the catheter terminates at the superior cavoatrial junction.  Unremarkable thyroid gland and thoracic inlet.  Heart/Vascular: Conventional 3 vessel aortic arch. No aneurysm or dissection. Excellent opacification of the pulmonary arteries. No evidence of pulmonary embolus. The heart is within normal limits for size. No pericardial effusion.  Lungs/Pleura: Moderate right and small left layering pleural effusions with significant bilateral left lower lobe atelectasis. Superimposed infiltrate difficult to exclude.  Bones/Soft Tissues: No acute fracture or aggressive appearing lytic or blastic osseous lesion.  Review of the MIP images confirms the above findings.  CTA ABDOMEN AND PELVIS FINDINGS  VASCULAR  Aorta: Normal caliber abdominal aorta. No evidence of aneurysm or dissection.  Celiac: The left hepatic artery is replaced to the left gastric artery.  SMA: Widely patent and unremarkable.  Renals: Single dominant renal arteries bilaterally which are widely patent and unremarkable.  IMA: Widely patent and unremarkable.  Inflow: Widely patent and unremarkable.  Proximal Outflow: Widely patent and unremarkable.  Veins: No focal venous abnormality identified.  NON-VASCULAR  Abdomen: A combination of streak artifact from the arms and arterial phase timing slightly limits evaluation of the abdomen and pelvis. The nasogastric tube terminates in the gastric antrum. Unremarkable CT appearance of the stomach, duodenum, spleen, adrenal glands and pancreas. The liver is normal in size. There is diffuse periportal edema and extensive thickening of the gallbladder wall. The gallbladder lumen itself is not distended. The portal veins remain patent.  Diffusely enlarged and edematous right kidney with patchy arterial phase enhancement is most consistent with severe pyelonephritis. Mild fullness of the right renal collecting system without true hydronephrosis. Minimal patchy enhancement of the left kidney favored to be secondary to streak artifact rather than true parenchymal edema.  Small volume  free fluid in the bilateral pericolic gutters and anatomic pelvis.  Pelvis: Free fluid layers within the pelvis. Hyperenhancing and post gravid appearance of the uterus. Foley catheter is present within the bladder.  Bones/Soft Tissues: Reticulation of the subcutaneous fat consistent with mild anasarca. No acute fracture or aggressive appearing lytic or blastic osseous lesion.  Review of the MIP images confirms the above findings.  IMPRESSION: CTA CHEST  1. No evidence of acute aortic dissection or other arterial abnormality. 2. No evidence of acute pulmonary embolus. 3. Moderate right and small left layering pleural effusions. No associated pulmonary edema. 4. Lobar atelectasis of the bilateral lower lobes. Superimposed infiltrate or aspiration is difficult to exclude entirely. 5. The tip of the endotracheal tube terminates above the carina. 6. The tip of the left IJ approach central venous catheter terminates at the superior cavoatrial junction.  CTA ABD/PELVIS  1. No evidence of acute aortic or other arterial abnormality. 2. Findings remain consistent with severe right-sided pyelonephritis. 3. There may be mild fullness of the right renal collecting system without true hydronephrosis.  4. Periportal edema, diffuse thickening of the gallbladder wall, subcutaneous anasarca and free fluid within the abdomen favored to be secondary to third spacing in the setting of sepsis and aggressive hydration. 5. The portal veins remain patent. 6. The bladder contains a moderate amount of urine despite the presence of the Foley catheter.   Electronically Signed   By: Malachy Moan M.D.   On: 05/06/2015 18:45   Ct Angio Abd/pel W/ And/or W/o  05/06/2015   EXAM: CT ANGIOGRAPHY CHEST, ABDOMEN AND PELVIS  TECHNIQUE: Multidetector CT imaging through the chest, abdomen and pelvis was performed using the standard protocol during bolus administration of intravenous contrast. Multiplanar reconstructed images and MIPs were obtained  and reviewed to evaluate the vascular anatomy.  CONTRAST:  OMNIPAQUE IOHEXOL 350 MG/ML SOLN  COMPARISON:  CT scan abdomen and pelvis 05/03/2015  FINDINGS: CTA CHEST FINDINGS  Mediastinum: The patient is intubated. The tip of the endotracheal tube terminates just above the carina. A nasogastric tube is present and terminates in the gastric antrum. There is a left IJ approach central venous catheter. The tip of the catheter terminates at the superior cavoatrial junction. Unremarkable thyroid gland and thoracic inlet.  Heart/Vascular: Conventional 3 vessel aortic arch. No aneurysm or dissection. Excellent opacification of the pulmonary arteries. No evidence of pulmonary embolus. The heart is within normal limits for size. No pericardial effusion.  Lungs/Pleura: Moderate right and small left layering pleural effusions with significant bilateral left lower lobe atelectasis. Superimposed infiltrate difficult to exclude.  Bones/Soft Tissues: No acute fracture or aggressive appearing lytic or blastic osseous lesion.  Review of the MIP images confirms the above findings.  CTA ABDOMEN AND PELVIS FINDINGS  VASCULAR  Aorta: Normal caliber abdominal aorta. No evidence of aneurysm or dissection.  Celiac: The left hepatic artery is replaced to the left gastric artery.  SMA: Widely patent and unremarkable.  Renals: Single dominant renal arteries bilaterally which are widely patent and unremarkable.  IMA: Widely patent and unremarkable.  Inflow: Widely patent and unremarkable.  Proximal Outflow: Widely patent and unremarkable.  Veins: No focal venous abnormality identified.  NON-VASCULAR  Abdomen: A combination of streak artifact from the arms and arterial phase timing slightly limits evaluation of the abdomen and pelvis. The nasogastric tube terminates in the gastric antrum. Unremarkable CT appearance of the stomach, duodenum, spleen, adrenal glands and pancreas. The liver is normal in size. There is diffuse periportal edema  and extensive thickening of the gallbladder wall. The gallbladder lumen itself is not distended. The portal veins remain patent.  Diffusely enlarged and edematous right kidney with patchy arterial phase enhancement is most consistent with severe pyelonephritis. Mild fullness of the right renal collecting system without true hydronephrosis. Minimal patchy enhancement of the left kidney favored to be secondary to streak artifact rather than true parenchymal edema.  Small volume free fluid in the bilateral pericolic gutters and anatomic pelvis.  Pelvis: Free fluid layers within the pelvis. Hyperenhancing and post gravid appearance of the uterus. Foley catheter is present within the bladder.  Bones/Soft Tissues: Reticulation of the subcutaneous fat consistent with mild anasarca. No acute fracture or aggressive appearing lytic or blastic osseous lesion.  Review of the MIP images confirms the above findings.  IMPRESSION: CTA CHEST  1. No evidence of acute aortic dissection or other arterial abnormality. 2. No evidence of acute pulmonary embolus. 3. Moderate right and small left layering pleural effusions. No associated pulmonary edema. 4. Lobar atelectasis of the bilateral lower lobes. Superimposed infiltrate or  aspiration is difficult to exclude entirely. 5. The tip of the endotracheal tube terminates above the carina. 6. The tip of the left IJ approach central venous catheter terminates at the superior cavoatrial junction.  CTA ABD/PELVIS  1. No evidence of acute aortic or other arterial abnormality. 2. Findings remain consistent with severe right-sided pyelonephritis. 3. There may be mild fullness of the right renal collecting system without true hydronephrosis. 4. Periportal edema, diffuse thickening of the gallbladder wall, subcutaneous anasarca and free fluid within the abdomen favored to be secondary to third spacing in the setting of sepsis and aggressive hydration. 5. The portal veins remain patent. 6. The  bladder contains a moderate amount of urine despite the presence of the Foley catheter.   Electronically Signed   By: Malachy Moan M.D.   On: 05/06/2015 18:45    Cardiac Studies:  ECG:  ST rate 141 no ST changes 05/06/15   Telemetry:  SR rates 70's   Echo:  EF 40-45%   Medications:   . antiseptic oral rinse  7 mL Mouth Rinse QID  . cefTRIAXone (ROCEPHIN)  IV  2 g Intravenous Q24H  . chlorhexidine gluconate  15 mL Mouth Rinse BID  . enoxaparin (LOVENOX) injection  40 mg Subcutaneous Daily  . lactobacillus  1 g Oral TID WC  . metoprolol tartrate  12.5 mg Oral BID  . pantoprazole (PROTONIX) IV  40 mg Intravenous Daily  . potassium chloride  30 mEq Oral Q4H  . 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 Continue beta blocker HR much improved Tachycardia Contributed to by anemia, sepsis and hyperthyroidism.  Down over 2 liters fluid CCM can decide on need for daily lasix No further cardiac suggestions Contineu Rx with antibiotics for pylonephritis  Charlton Haws 05/08/2015, 9:23 AM

## 2015-05-09 DIAGNOSIS — J9601 Acute respiratory failure with hypoxia: Secondary | ICD-10-CM

## 2015-05-09 DIAGNOSIS — R6521 Severe sepsis with septic shock: Secondary | ICD-10-CM

## 2015-05-09 DIAGNOSIS — O903 Peripartum cardiomyopathy: Secondary | ICD-10-CM | POA: Diagnosis present

## 2015-05-09 LAB — CBC WITH DIFFERENTIAL/PLATELET
BASOS ABS: 0 10*3/uL (ref 0.0–0.1)
BASOS PCT: 0 %
EOS PCT: 3 %
Eosinophils Absolute: 0.2 10*3/uL (ref 0.0–0.7)
HEMATOCRIT: 25.1 % — AB (ref 36.0–46.0)
Hemoglobin: 8.3 g/dL — ABNORMAL LOW (ref 12.0–15.0)
Lymphocytes Relative: 23 %
Lymphs Abs: 1.9 10*3/uL (ref 0.7–4.0)
MCH: 25.7 pg — ABNORMAL LOW (ref 26.0–34.0)
MCHC: 33.1 g/dL (ref 30.0–36.0)
MCV: 77.7 fL — ABNORMAL LOW (ref 78.0–100.0)
MONO ABS: 0.5 10*3/uL (ref 0.1–1.0)
Monocytes Relative: 6 %
NEUTROS ABS: 5.4 10*3/uL (ref 1.7–7.7)
Neutrophils Relative %: 68 %
PLATELETS: 110 10*3/uL — AB (ref 150–400)
RBC: 3.23 MIL/uL — ABNORMAL LOW (ref 3.87–5.11)
RDW: 16.6 % — AB (ref 11.5–15.5)
WBC: 8.3 10*3/uL (ref 4.0–10.5)

## 2015-05-09 LAB — COMPREHENSIVE METABOLIC PANEL
ALBUMIN: 2 g/dL — AB (ref 3.5–5.0)
ALK PHOS: 198 U/L — AB (ref 38–126)
ALT: 17 U/L (ref 14–54)
ANION GAP: 5 (ref 5–15)
AST: 25 U/L (ref 15–41)
BILIRUBIN TOTAL: 1.2 mg/dL (ref 0.3–1.2)
BUN: 6 mg/dL (ref 6–20)
CALCIUM: 7.9 mg/dL — AB (ref 8.9–10.3)
CO2: 27 mmol/L (ref 22–32)
Chloride: 102 mmol/L (ref 101–111)
Creatinine, Ser: 0.98 mg/dL (ref 0.44–1.00)
GFR calc Af Amer: >60 mL/min (ref >60)
GFR calc non Af Amer: >60 mL/min (ref >60)
GLUCOSE: 97 mg/dL (ref 65–99)
Potassium: 3.6 mmol/L (ref 3.5–5.1)
Sodium: 134 mmol/L — ABNORMAL LOW (ref 135–145)
TOTAL PROTEIN: 6.1 g/dL — AB (ref 6.5–8.1)

## 2015-05-09 LAB — LACTIC ACID, PLASMA: LACTIC ACID, VENOUS: 0.8 mmol/L (ref 0.5–2.0)

## 2015-05-09 LAB — HIV-1 RNA, QUALITATIVE, TMA: HIV-1 RNA, Qualitative, TMA: NEGATIVE

## 2015-05-09 MED ORDER — ASPIRIN EC 81 MG PO TBEC
81.0000 mg | DELAYED_RELEASE_TABLET | Freq: Every day | ORAL | Status: DC
  Administered 2015-05-09 – 2015-05-10 (×2): 81 mg via ORAL
  Filled 2015-05-09 (×2): qty 1

## 2015-05-09 MED ORDER — PANTOPRAZOLE SODIUM 40 MG PO TBEC
40.0000 mg | DELAYED_RELEASE_TABLET | Freq: Every day | ORAL | Status: DC
  Administered 2015-05-09: 40 mg via ORAL
  Filled 2015-05-09: qty 1

## 2015-05-09 MED ORDER — CEFUROXIME AXETIL 500 MG PO TABS
500.0000 mg | ORAL_TABLET | Freq: Two times a day (BID) | ORAL | Status: DC
  Administered 2015-05-09 – 2015-05-10 (×2): 500 mg via ORAL
  Filled 2015-05-09 (×2): qty 1

## 2015-05-09 NOTE — Progress Notes (Signed)
[place] TEAM 1 - Stepdown/ICU TEAM Progress Note  Vibha Ferdig HQI:[id number] DOB: [birth year] DOA: 05/03/2015 PCP: No primary care provider on file.  Admit HPI / Brief Narrative: 19 year old BF PMHx none, Two weeks postpartum from vaginal delivery which was complicated by postpartum hemorrhage requiring prbc Transfusion and postpartum Endometritis required dilation and evacuation secondary to retained products of conception 7 days postpartum and abx. She was brought to the ER by EMS due to right sided ab pain, back pain, she was found to be hypotensive, sinus tachycardia, leukocytosis, lactic acidosis, CT ab showed +right sided pyelo, she is given vanc/zosyn and hospitalist called to admit the patient.  Currently patient reported feeling a little better, she reported not recent vaginal discharge or bleed, but did developed n/v and diarrhea today.    HPI/Subjective: 9/23 A/O 4, NAD. Negative SOB, negative CP, negative N/V. States she wishes ready to be discharged.     Assessment/Plan: Severe sepsis with right sided E coli pyelonephritis and E coli bacteremia  -Continue ceftriaxone change to Cefuroxime 500 mg BID -Panculture; positive for Escherichia coli bacteremia/UTI - PCXR; positive pulmonary edema just prior to patient being intubated.  -Schedule appointment with [facility] 1 week post discharge Escherichia coli bacteremia, new onset postpartum cardiomyopathy  Sinus tachycardia  -See severe sepsis  -Most c/w sepsis due to bacteremia, and flash pulmonary edema secondary to patient's newly diagnosed peripartum cardiomyopathy.  Peripartum cardiomyopathy -Echocardiogram revealed patient was suffering from peripartum cardiomyopathy -Counseled that her current EF (> 25%) it would be risky to have another pregnancy. -Counseled to follow-up with heart failure clinic and monitor cardiac function to see if it improves over time. -Start aspirin 81 mg  daily -Continue metoprolol  12.5 mg BID -Patient to establish care with Dr. Arvilla Meres in 1-2 weeks new onset Postpartum Cardiomyopathy  Fever/chills -See severe sepsis -Patient went into flash pulmonary edema secondary to her postpartum cardiomyopathy was intubated for respiratory distress.   Acute renal failure -Cr max= 1.33  -Cr WNL -continue to Avoid nephrotoxic medication  Hypokalemia  -Resolved continue monitor closely    Severe hypomagnesemia -Resolved y   Recent endometritis/Retained products of conception after delivery with complications -As above  -Scheduled appointment with [facility] 1 week post discharge Escherichia coli bacteremia, new onset postpartum cardiomyopathy  Normocytic anemia in setting of recent post-delivery uterine bleeding  -Transfuse if Hgb < 8.0, or if tachycardia persists despite ongoing volume expansion     Code Status: FULL Family Communication: no family present at time of exam Disposition Plan:     Consultants: Clemencia Course [facility] M).   Procedure/Significant Events: 9/6 Dilation and Evacuation; endometritis and retained products of conception 7 days postpartum 9/17 CT abdomen pelvis with contrast; Rt pyelonephritis. Negative renal abscess. -Periportal edema and diffuse gallbladder wall thickening. Diffuse haziness mesentery and retroperitoneum, and small volume perihepatic ascites. Aggressive hydration vs third-spacing vs reactive from the right Pyelonephritis. 9/19 ultrasound lower extremity; negative for DVT bilateral 9/20 VQ scan; low probability for PE 9/20 echocardiogram;- LVEF=40 to 45%. -Negative WMA  - Pulmonary arteries: PA peak pressure: 31 mm Hg (S). 9/21 PCXR; pulmonary edema   Culture 9/17 blood left forearm positive Escherichia coli 9/17 blood left hand negative 9/17 urine positive Escherichia coli 9/18 MRSA by PCR negative 9/20 blood left hand/Rt AC  NGTD    Antibiotics: Zosyn 9/17 > stopped 9/18 Vanc 9/17 > stopped9/18 Rocephin 9/18 > stopped 9/23 Cefuroxime 9/23>>   DVT prophylaxis:  lovenox    Devices   LINES / TUBES:      Continuous Infusions:    Objective: VITAL SIGNS: Temp: 98.5 F (36.9 C) (09/23 1726) Temp Source: Oral (09/23 1726) BP: 128/78 mmHg (09/23 1726) Pulse Rate: 84 (09/23 1726) SPO2; FIO2:   Intake/Output Summary (Last 24 hours) at 05/09/15 1928 Last data filed at 05/09/15 1728  Gross per 24 hour  Intake   1680 ml  Output      3 ml  Net   1677 ml     Exam: General: A/O 4, NAD, No acute respiratory distress Eyes: Negative headache, eye pain, double vision,negative scleral hemorrhage ENT: Negative Runny nose, negative ear pain, negative gingival bleeding, Neck:  Negative scars, masses, torticollis, lymphadenopathy, JVD Lungs: Tachypnea, Clear to auscultation bilaterally without wheezes or crackles Cardiovascular: Regular rhythm and rate without murmur gallop or rub normal S1 and S2 Abdomen:negative abdominal pain, nondistended, positive soft, bowel sounds, no rebound, no ascites, no appreciable mass Extremities: No significant cyanosis, clubbing, or edema bilateral lower extremities Psychiatric:  Negative depression, negative anxiety, negative fatigue, negative mania Neurologic:  Cranial nerves II through XII intact, tongue/uvula midline, all extremities muscle strength 5/5, sensation intact throughout, negative dysarthria, negative expressive aphasia, negative receptive aphasia.   Data Reviewed: Basic Metabolic Panel:  Recent Labs Lab 05/04/15 1900  05/05/15 1445 05/06/15 0310 05/07/15 0459 05/07/15 1955 05/08/15 0315 05/09/15 0514  NA  --   < > 139 136 137 136 136 134*  K  --   < > 3.5 3.9 3.4* 3.1* 3.2* 3.6  CL  --   < > 114* 113* 108 104 105 102  CO2  --   < > 18* 17* 22 24 26 27   GLUCOSE  --   < > 92 107* 95 121* 97 97  BUN  --   < > 7 7 5* <5* <5* 6  CREATININE  --    < > 1.16* 1.18* 1.06* 1.08* 0.92 0.98  CALCIUM  --   < > 7.6* 7.8* 7.4* 7.5* 7.4* 7.9*  MG 1.1*  --  2.7*  --  1.4* 2.5*  --   --   PHOS  --   --   --   --  1.5* 5.0*  --   --   < > = values in this interval not displayed. Liver Function Tests:  Recent Labs Lab 05/03/15 1820 05/04/15 0600 05/05/15 0225 05/06/15 0310 05/09/15 0514  AST 25 28 19 24 25   ALT 12* 14 12* 14 17  ALKPHOS 140* 149* 123 136* 198*  BILITOT 1.4* 2.3* 2.0* 2.0* 1.2  PROT 6.0* 5.2* 4.5* 4.7* 6.1*  ALBUMIN 2.5* 1.9* 1.7* 1.6* 2.0*    Recent Labs Lab 05/03/15 1820  LIPASE 14*   No results for input(s): AMMONIA in the last 168 hours. CBC:  Recent Labs Lab 05/03/15 1820  05/06/15 0310 05/07/15 0459 05/07/15 1955 05/08/15 0315 05/09/15 0514  WBC 17.3*  < > 3.8* 5.1 8.3 7.1 8.3  NEUTROABS 15.5*  --   --   --   --   --  5.4  HGB 8.9*  < > 8.0* 7.0* 6.5* 7.8* 8.3*  HCT 27.8*  < > 24.6* 21.4* 19.8* 23.5* 25.1*  MCV 80.3  < > 76.9* 77.0* 75.3* 76.8* 77.7*  PLT 275  < > 128* 89* 85* 73* 110*  < > = values in this interval not displayed. Cardiac Enzymes:  Recent Labs Lab 05/06/15 1437 05/06/15 1907 05/07/15 0051  TROPONINI 0.09* 0.04* 0.03   BNP (last 3 results) No results for input(s): BNP in the last 8760 hours.  ProBNP (last 3 results) No results for input(s): PROBNP in the last 8760 hours.  CBG:  Recent Labs Lab 05/06/15 1609  GLUCAP 110*    Recent Results (from the past 240 hour(s))  Blood Culture (routine x 2)     Status: None   Collection Time: 05/03/15  6:15 PM  Result Value Ref Range Status   Specimen Description BLOOD LEFT FOREARM  Final   Special Requests BOTTLES DRAWN AEROBIC AND ANAEROBIC 5CC  Final   Culture  Setup Time   Final    GRAM NEGATIVE RODS IN BOTH AEROBIC AND ANAEROBIC BOTTLES CRITICAL RESULT CALLED TO, READ BACK BY AND VERIFIED WITH: T DAVIS,RN AT 0846 05/04/15 BY L BENFIELD    Culture ESCHERICHIA COLI  Final   Report Status 05/06/2015 FINAL  Final    Organism ID, Bacteria ESCHERICHIA COLI  Final      Susceptibility   Escherichia coli - MIC*    AMPICILLIN <=2 SENSITIVE Sensitive     CEFAZOLIN <=4 SENSITIVE Sensitive     CEFEPIME <=1 SENSITIVE Sensitive     CEFTAZIDIME <=1 SENSITIVE Sensitive     CEFTRIAXONE <=1 SENSITIVE Sensitive     CIPROFLOXACIN <=0.25 SENSITIVE Sensitive     GENTAMICIN <=1 SENSITIVE Sensitive     IMIPENEM <=0.25 SENSITIVE Sensitive     TRIMETH/SULFA <=20 SENSITIVE Sensitive     AMPICILLIN/SULBACTAM <=2 SENSITIVE Sensitive     PIP/TAZO <=4 SENSITIVE Sensitive     * ESCHERICHIA COLI  Blood Culture (routine x 2)     Status: None   Collection Time: 05/03/15  7:00 PM  Result Value Ref Range Status   Specimen Description BLOOD LEFT HAND  Final   Special Requests   Final    BOTTLES DRAWN AEROBIC AND ANAEROBIC 10CC POF VANC AND ZOSEYN   Culture NO GROWTH 5 DAYS  Final   Report Status 05/08/2015 FINAL  Final  Urine culture     Status: None   Collection Time: 05/03/15  8:08 PM  Result Value Ref Range Status   Specimen Description URINE, CLEAN CATCH  Final   Special Requests NONE  Final   Culture >=100,000 COLONIES/mL ESCHERICHIA COLI  Final   Report Status 05/05/2015 FINAL  Final   Organism ID, Bacteria ESCHERICHIA COLI  Final      Susceptibility   Escherichia coli - MIC*    AMPICILLIN <=2 SENSITIVE Sensitive     CEFAZOLIN <=4 SENSITIVE Sensitive     CEFTRIAXONE <=1 SENSITIVE Sensitive     CIPROFLOXACIN <=0.25 SENSITIVE Sensitive     GENTAMICIN <=1 SENSITIVE Sensitive     IMIPENEM <=0.25 SENSITIVE Sensitive     NITROFURANTOIN <=16 SENSITIVE Sensitive     TRIMETH/SULFA <=20 SENSITIVE Sensitive     AMPICILLIN/SULBACTAM <=2 SENSITIVE Sensitive     PIP/TAZO <=4 SENSITIVE Sensitive     * >=100,000 COLONIES/mL ESCHERICHIA COLI  MRSA PCR Screening     Status: None   Collection Time: 05/04/15  7:00 AM  Result Value Ref Range Status   MRSA by PCR NEGATIVE NEGATIVE Final    Comment:        The GeneXpert MRSA Assay  (FDA approved for NASAL specimens only), is one component of a comprehensive MRSA colonization surveillance program. It is not intended to diagnose MRSA infection nor to guide or monitor treatment for MRSA infections.   Culture, blood (routine x 2)  Status: None (Preliminary result)   Collection Time: 05/06/15  9:45 AM  Result Value Ref Range Status   Specimen Description BLOOD LEFT HAND  Final   Special Requests BOTTLES DRAWN AEROBIC AND ANAEROBIC  5CC  Final   Culture NO GROWTH 3 DAYS  Final   Report Status PENDING  Incomplete  Culture, blood (routine x 2)     Status: None (Preliminary result)   Collection Time: 05/06/15  9:51 AM  Result Value Ref Range Status   Specimen Description BLOOD RIGHT ANTECUBITAL  Final   Special Requests BOTTLES DRAWN AEROBIC AND ANAEROBIC 5CC  Final   Culture NO GROWTH 3 DAYS  Final   Report Status PENDING  Incomplete     Studies:  Recent x-ray studies have been reviewed in detail by the Attending Physician  Scheduled Meds:  Scheduled Meds: . aspirin EC  81 mg Oral Daily  . cefUROXime  500 mg Oral BID WC  . lactobacillus  1 g Oral TID WC  . metoprolol tartrate  12.5 mg Oral BID  . pantoprazole  40 mg Oral Q1200    Time spent on care of this patient: 40 mins   WOODS, Roselind Messier , MD  [facility] Office  [phone] Pager - [phone]  On-Call/Text Page:      [email or web address]      password [email or web address]  If 7PM-7AM, please contact night-coverage [email or web address] Password [facility] 05/09/2015, 7:28 PM   LOS: 6 days   Care during the described time interval was provided by me .  I have reviewed this patient's available data, including medical history, events of note, physical examination, and all test results as part of my evaluation. I have personally reviewed and interpreted all radiology studies.   Carolyne Littles, MD [phone] Pager

## 2015-05-09 NOTE — Progress Notes (Signed)
Patient transferred to 6e05. Report called to [employer], Press photographer. Patient placed on telemetry and was oriented to unit and unit staff. Orders have been reviewed and implemented. Call light within reach, will continue to monitor.   Feliciana Rossetti, RN, BSN

## 2015-05-09 NOTE — Progress Notes (Signed)
PHARMACIST - PHYSICIAN COMMUNICATION DR:   Joseph Art CONCERNING: Protonix IV to Oral Route Change Policy  RECOMMENDATION: This patient is receiving Protonix by the intravenous route.  Based on criteria approved by the Pharmacy and Therapeutics Committee, this drug is being converted to the equivalent oral dose form(s).  DESCRIPTION: These criteria include:  The patient is eating (either orally or via tube) and/or has been taking other orally administered medications for a least 24 hours  There is no active GI bleed or impaired GI absorption noted.   If you have questions about this conversion, please contact the Pharmacy Department  []   [phone] )  [facility] [x]   [phone] )  [facility]  []   [phone] )  [facility] []   [phone] )  [facility]   Georgina Pillion, PharmD, BCPS Clinical Pharmacist Pager: [phone] 05/09/2015 9:04 AM

## 2015-05-09 NOTE — Evaluation (Signed)
Physical Therapy Evaluation Patient Details Name: Deanna Sullivan MRN: [id number] DOB: February 03, 1996 Today's Date: 05/09/2015   History of Present Illness  Patient is a 19 yo female admitted 05/03/15 with abdominal and back pain. Patient with sepsis due to pyelonephritis.  Patient became tachycardic and hypotensive, confused, and agitated.  On 9/20, patient transfered to ICU and intubated.  Extubated 05/07/15.   Clinical Impression  Patient is functioning at supervision to independent level with mobility and gait.  Good balance during gait with no assistive device.  No acute PT needs identified - PT will sign off.  Recommended patient continue to ambulate in hallway with nursing/family to increase endurance.    Follow Up Recommendations No PT follow up;Supervision for mobility/OOB    Equipment Recommendations  None recommended by PT    Recommendations for Other Services       Precautions / Restrictions Precautions Precautions: None Restrictions Weight Bearing Restrictions: No      Mobility  Bed Mobility Overal bed mobility: Independent                Transfers Overall transfer level: Modified independent Equipment used: None             General transfer comment: Increased time.  Good balance in stance.  Ambulation/Gait Ambulation/Gait assistance: Supervision Ambulation Distance (Feet): 200 Feet Assistive device: None Gait Pattern/deviations: Step-through pattern;Decreased stride length Gait velocity: decreased Gait velocity interpretation: Below normal speed for age/gender General Gait Details: Patient with slow, steady gait pattern.  States legs feel "weak".  No loss of balance during gait.  Stairs            Wheelchair Mobility    Modified Rankin (Stroke Patients Only)       Balance Overall balance assessment: No apparent balance deficits (not formally assessed)                                           Pertinent Vitals/Pain  Pain Assessment: 0-10 Pain Score: 3  Pain Location: back Pain Descriptors / Indicators: Aching Pain Intervention(s): Monitored during session;Repositioned    Home Living Family/patient expects to be discharged to:: Private residence (To Mom's home at discharge) Living Arrangements: Alone;Children (Newborn) Available Help at Discharge: Family;Available 24 hours/day (Mother available 24/7) Type of Home: House Home Access: Stairs to enter Entrance Stairs-Rails: Right Entrance Stairs-Number of Steps: 2 Home Layout: One level Home Equipment: Environmental consultant - 2 wheels      Prior Function Level of Independence: Independent         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        Extremity/Trunk Assessment   Upper Extremity Assessment: Overall WFL for tasks assessed           Lower Extremity Assessment: Generalized weakness      Cervical / Trunk Assessment: Normal  Communication   Communication: No difficulties  Cognition Arousal/Alertness: Awake/alert Behavior During Therapy: WFL for tasks assessed/performed Overall Cognitive Status: Within Functional Limits for tasks assessed                      General Comments      Exercises        Assessment/Plan    PT Assessment Patent does not need any further PT services  PT Diagnosis Abnormality of gait;Generalized weakness   PT Problem List    PT Treatment  Interventions     PT Goals (Current goals can be found in the Care Plan section) Acute Rehab PT Goals PT Goal Formulation: All assessment and education complete, DC therapy    Frequency     Barriers to discharge        Co-evaluation               End of Session   Activity Tolerance: Patient tolerated treatment well Patient left: in bed;with call bell/phone within reach (sitting EOB) Nurse Communication: Mobility status (Encouraged ambulation in hallway with nsg)         Time: 5498-2641 PT Time Calculation (min) (ACUTE  ONLY): 13 min   Charges:   PT Evaluation $Initial PT Evaluation Tier I: 1 Procedure     PT G CodesVena Austria 2015/06/08, 11:19 AM Durenda Hurt. Renaldo Fiddler, [facility] Acute Rehab Services Pager [phone]

## 2015-05-10 DIAGNOSIS — D72829 Elevated white blood cell count, unspecified: Secondary | ICD-10-CM

## 2015-05-10 DIAGNOSIS — O903 Peripartum cardiomyopathy: Secondary | ICD-10-CM

## 2015-05-10 DIAGNOSIS — N12 Tubulo-interstitial nephritis, not specified as acute or chronic: Secondary | ICD-10-CM | POA: Diagnosis present

## 2015-05-10 LAB — BASIC METABOLIC PANEL
ANION GAP: 8 (ref 5–15)
BUN: 6 mg/dL (ref 6–20)
CO2: 23 mmol/L (ref 22–32)
Calcium: 8.2 mg/dL — ABNORMAL LOW (ref 8.9–10.3)
Chloride: 102 mmol/L (ref 101–111)
Creatinine, Ser: 0.8 mg/dL (ref 0.44–1.00)
Glucose, Bld: 85 mg/dL (ref 65–99)
POTASSIUM: 4.1 mmol/L (ref 3.5–5.1)
SODIUM: 133 mmol/L — AB (ref 135–145)

## 2015-05-10 LAB — MAGNESIUM: Magnesium: 1.5 mg/dL — ABNORMAL LOW (ref 1.7–2.4)

## 2015-05-10 MED ORDER — ASPIRIN 81 MG PO TBEC
81.0000 mg | DELAYED_RELEASE_TABLET | Freq: Every day | ORAL | Status: DC
Start: 2015-05-10 — End: 2016-03-11

## 2015-05-10 MED ORDER — CEFUROXIME AXETIL 500 MG PO TABS: 500.0000 mg | ORAL_TABLET | Freq: Two times a day (BID) | ORAL | Status: DC

## 2015-05-10 MED ORDER — METOPROLOL TARTRATE 25 MG PO TABS: 12.5000 mg | ORAL_TABLET | Freq: Two times a day (BID) | ORAL | Status: DC

## 2015-05-10 MED ORDER — MAGNESIUM OXIDE 400 (241.3 MG) MG PO TABS: 400.0000 mg | ORAL_TABLET | Freq: Two times a day (BID) | ORAL | Status: DC

## 2015-05-10 MED ORDER — MAGNESIUM OXIDE 400 (241.3 MG) MG PO TABS
400.0000 mg | ORAL_TABLET | Freq: Two times a day (BID) | ORAL | Status: DC
  Administered 2015-05-10: 400 mg via ORAL
  Filled 2015-05-10: qty 1

## 2015-05-10 NOTE — Care Management Note (Signed)
Case Management Note  Patient Details  Name: Deanna Sullivan MRN: [id number] Date of Birth: 07-10-1996  Subjective/Objective:                   postpartum hemorrhage requiring prbc Action/Plan:  Discharge plannning Expected Discharge Date:  02/07/15               Expected Discharge Plan:  Home/Self Care  In-House Referral:     Discharge planning Services  CM Consult  Post Acute Care Choice:    Choice offered to:     DME Arranged:    DME Agency:     HH Arranged:    [place] Agency:     Status of Service:  Completed, signed off  Medicare Important Message Given:    Date Medicare IM Given:    Medicare IM give by:    Date Additional Medicare IM Given:    Additional Medicare Important Message give by:     If discussed at [place] of Stay Meetings, dates discussed:    Additional Comments: CM met with pt in room and gave pt [facility] pamphlet and discussed (and wrote on pamphlet) pt can go to the clinic any weekday morning from 9-10am and request an appointment for follow up medical care.  The clinic is closed at this time to schedule an appt.  No other CM needs were communicated.   Dellie Catholic, RN 05/10/2015, 12:23 PM

## 2015-05-10 NOTE — Discharge Summary (Signed)
Physician Discharge Summary  Deanna Sullivan ZOX:[id number] DOB: [birth year] DOA: 05/03/2015  PCP: No primary care provider on file.  Admit date: 05/03/2015 Discharge date: 05/10/2015  Time spent: 40 minutes  Recommendations for Outpatient Follow-up:  Severe sepsis with right sided E coli pyelonephritis and E coli bacteremia  -Continue ceftriaxone change to Cefuroxime 500 mg BID to complete a two-week course of antibiotics -Panculture; positive for Escherichia coli bacteremia/UTI - PCXR; positive pulmonary edema just prior to patient being intubated.  -Schedule appointment with [facility] 1 week post discharge Escherichia coli bacteremia, new onset postpartum cardiomyopathy  Sinus tachycardia  -See severe sepsis  -Most c/w sepsis due to bacteremia, and flash pulmonary edema secondary to patient's newly diagnosed peripartum cardiomyopathy. -Has resolved  Peripartum cardiomyopathy -Echocardiogram revealed patient was suffering from peripartum cardiomyopathy -Counseled that her current EF (> 25%) it would be risky to have another pregnancy. Patient should avoid becoming pregnant until cleared by PCP and her cardiologist. -Counseled to follow-up with heart failure clinic and monitor cardiac function to see if it improves over time. -Start aspirin 81 mg daily -Continue metoprolol 12.5 mg BID -Patient to establish care with Dr. Arvilla Meres in 1-2 weeks new onset Postpartum Cardiomyopathy  Fever/chills -See severe sepsis -Patient went into flash pulmonary edema secondary to her postpartum cardiomyopathy was intubated for respiratory distress.   Acute renal failure -Cr max= 1.33  -Cr WNL -continue to Avoid nephrotoxic medication  Hypokalemia  -Potassium goal> 4 -Resolved continue monitor closely   Severe hypomagnesemia -Magnesium goal> 2 -Discharge on magnesium oxide 400 mg BID    Recent endometritis/Retained products of conception after  delivery with complications -As above  -Scheduled appointment with [facility] 1 week post discharge Escherichia coli bacteremia, new onset peripartum cardiomyopathy  Normocytic anemia in setting of recent post-delivery uterine bleeding  -Transfuse if Hgb < 8.0, or if tachycardia persists despite ongoing volume expansion   Contraception -Secondary to patient's newly diagnosed peripartum cardiomyopathy would discuss removal of  Nexplanon, and discuss non-estrogenized containing contraception.     Discharge Diagnoses:  Active Problems:   Sepsis   Acute respiratory failure with hypoxemia   Septic shock   Metabolic acidosis   Acute pyelonephritis   Severe sepsis   Bacteremia, escherichia coli   Fever and chills   Acute renal failure syndrome   Hypokalemia   Hypomagnesemia   Sinus tachycardia   Endometritis complicating pregnancy   Retained products of conception after delivery with complications   Normocytic anemia   Aortic dissection   Back pain   Peripartum cardiomyopathy   Leukocytosis   Pyelonephritis   Discharge Condition: Stable   Diet recommendation: Regular  Filed Weights   05/04/15 0130 05/08/15 2122 05/09/15 2046  Weight: 58.3 kg (128 lb 8.5 oz) 58.378 kg (128 lb 11.2 oz) 58.5 kg (128 lb 15.5 oz)    History of present illness:  19 year old BF PMHx none, Two weeks postpartum from vaginal delivery which was complicated by postpartum hemorrhage requiring prbc Transfusion and postpartum Endometritis required dilation and evacuation secondary to retained products of conception 7 days postpartum and abx. She was brought to the ER by EMS due to right sided ab pain, back pain, she was found to be hypotensive, sinus tachycardia, leukocytosis, lactic acidosis, CT ab showed +right sided pyelo, she is given vanc/zosyn and hospitalist called to admit the patient.  Currently patient reported feeling a little better, she reported not  recent vaginal discharge or bleed, but did  developed n/v and diarrhea today.  Patient's hospitalization has been complicated by Escherichia coli bacteremia/UTI, and new onset peripartum cardiomyopathy which resulted in flash pulmonary edema and patient becoming intubated. Patient was started on beta blocker, and counseled not to become pregnant again until cleared by her PCP/cardiologist. Patient was also counseled that she carry increased risk for additional heart failure especially if she became pregnant again.     Consultants: Clemencia Course [facility] M).   Procedure/Significant Events: 9/6 Dilation and Evacuation; endometritis and retained products of conception 7 days postpartum 9/17 CT abdomen pelvis with contrast; Rt pyelonephritis. Negative renal abscess. -Periportal edema and diffuse gallbladder wall thickening. Diffuse haziness mesentery and retroperitoneum, and small volume perihepatic ascites. Aggressive hydration vs third-spacing vs reactive from the right Pyelonephritis. 9/19 ultrasound lower extremity; negative for DVT bilateral 9/20 VQ scan; low probability for PE 9/20 echocardiogram;- LVEF=40 to 45%. -Negative WMA  - Pulmonary arteries: PA peak pressure: 31 mm Hg (S). 9/21 PCXR; pulmonary edema   Culture 9/17 blood left forearm positive Escherichia coli 9/17 blood left hand negative 9/17 urine positive Escherichia coli 9/18 MRSA by PCR negative 9/20 blood left hand/Rt AC NGTD    Antibiotics: Zosyn 9/17 > stopped 9/18 Vanc 9/17 > stopped9/18 Rocephin 9/18 > stopped 9/23 Cefuroxime 9/23>>     Discharge Exam: Filed Vitals:   05/09/15 0820 05/09/15 1726 05/09/15 2046 05/10/15 0512  BP: 121/75 128/78 133/85 121/81  Pulse: 78 84 65 74  Temp: 99 F (37.2 C) 98.5 F (36.9 C) 98.9 F (37.2 C) 99.6 F (37.6 C)  TempSrc: Oral Oral Oral Oral  Resp: 22 22 16 18   Height:      Weight:   58.5 kg (128 lb 15.5 oz)   SpO2: 100% 100% 99% 100%    General: A/O 4,  NAD, No acute respiratory distress Eyes: Negative headache, eye pain, double vision,negative scleral hemorrhage ENT: Negative Runny nose, negative ear pain, negative gingival bleeding, Neck: Negative scars, masses, torticollis, lymphadenopathy, JVD Lungs: Tachypnea, Clear to auscultation bilaterally without wheezes or crackles Cardiovascular: Regular rhythm and rate without murmur gallop or rub normal S1 and S2 Abdomen:negative abdominal pain, nondistended, positive soft, bowel sounds, no rebound, no ascites, no appreciable mass   Discharge Instructions     Medication List    STOP taking these medications        ibuprofen 600 MG tablet  Commonly known as:  ADVIL,MOTRIN     oxyCODONE-acetaminophen 5-325 MG per tablet  Commonly known as:  PERCOCET/ROXICET     PRENATAL COMPLETE 14-0.4 MG Tabs      TAKE these medications        aspirin 81 MG EC tablet  Take 1 tablet (81 mg total) by mouth daily.     cefUROXime 500 MG tablet  Commonly known as:  CEFTIN  Take 1 tablet (500 mg total) by mouth 2 (two) times daily with a meal.     ferrous sulfate 325 (65 FE) MG tablet  Commonly known as:  FERROUSUL  Take 1 tablet (325 mg total) by mouth 2 (two) times daily.     magnesium oxide 400 (241.3 MG) MG tablet  Commonly known as:  MAG-OX  Take 1 tablet (400 mg total) by mouth 2 (two) times daily.     metoprolol tartrate 25 MG tablet  Commonly known as:  LOPRESSOR  Take 0.5 tablets (12.5 mg total) by mouth 2 (two) times daily.     NEXPLANON 68 MG Impl implant  Generic drug:  etonogestrel  1 each by Subdermal route once. Implanted 04/16/15       No Known Allergies Follow-up Information    Follow up with Arvilla Meres, MD.   Specialty:  Cardiology   Why:  Patient to establish care with Dr. Arvilla Meres in 1-2 weeks new onset postpartum cardiomyopathy   Contact information:   [address] [place] [address] [phone]       Follow up with [facility]    . Schedule an appointment as soon as possible for a visit in 1 week.   Why:  Patient to establish care with [facility] 1 week post discharge Escherichia coli bacteremia, new onset postpartum cardiomyopathy   Contact information:   [address] [place] [address] [phone]       The results of significant diagnostics from this hospitalization (including imaging, microbiology, ancillary and laboratory) are listed below for reference.    Significant Diagnostic Studies: Dg Chest 2 View  05/06/2015   CLINICAL DATA:  Pneumonia affecting pregnancy. Feeling better today.  EXAM: CHEST  2 VIEW  COMPARISON:  05/03/2015  FINDINGS: Decreasing lung volumes with increasing bibasilar opacities, likely atelectasis. Possible small effusions. Heart size is borderline. No acute bony abnormality.  IMPRESSION: Worsening aeration of the lungs with increasing bibasilar opacities, likely atelectasis.  Suspect small effusions.   Electronically Signed   By: Charlett Nose M.D.   On: 05/06/2015 09:07   Nm Pulmonary Perfusion  05/06/2015   CLINICAL DATA:  Recent postpartum patient was actively breastfeeding with right-sided abdominal and back pain.  EXAM: NUCLEAR MEDICINE VENTILATION AND PERFUSION SCAN  TECHNIQUE: Perfusion images were obtained in multiple projections after intravenous injection of radiopharmaceutical.  RADIOPHARMACEUTICALS:  3 mCi Tc67m MAA IV  COMPARISON:  Chest radiograph 05/06/2015  FINDINGS: Profusion images demonstrate no profusion abnormality. Radiotracer is homogeneously distributed throughout the lungs bilaterally.  IMPRESSION: Very low probability for pulmonary embolism (PE absent).   Electronically Signed   By: Annia Belt M.D.   On: 05/06/2015 11:30   US Transvaginal Non-ob  05/07/2015   CLINICAL DATA:  Retained products of conception.  Septic shock.  EXAM: TRANSABDOMINAL AND TRANSVAGINAL  ULTRASOUND OF PELVIS  TECHNIQUE: Both transabdominal and transvaginal ultrasound examinations of the pelvis were performed. Transabdominal technique was performed for global imaging of the pelvis including uterus, ovaries, adnexal regions, and pelvic cul-de-sac. It was necessary to proceed with endovaginal exam following the transabdominal exam to visualize the uterus and ovaries.  COMPARISON:  CT 05/06/2015.  FINDINGS: Uterus  Measurements: 11.6 x 5.2 x 6.9 cm. No fibroids or other mass visualized.  Endometrium  Thickness: 6.7 mm. No focal abnormality identified. No retained products of conception noted. Trace endometrial fluid.  Right ovary  Measurements: 3.1 x 2.1 x 2.9 cm. Normal appearance/no adnexal mass.  Left ovary  Measurements: 2.7 x 2.0 x 2.9 cm. Normal appearance/no adnexal mass.  Other findings  Large amount of free pelvic fluid.  IMPRESSION: 1. Large amount of free pelvic fluid. 2. No evidence of endometrial thickening or retained products of conception. Trace residual endometrial fluid.   Electronically Signed   By: Maisie Fus  Register   On: 05/07/2015 07:39   US Pelvis Complete  05/07/2015   CLINICAL DATA:  Retained products of conception.  Septic shock.  EXAM: TRANSABDOMINAL AND TRANSVAGINAL ULTRASOUND OF PELVIS  TECHNIQUE: Both transabdominal and transvaginal ultrasound examinations of the pelvis were performed. Transabdominal technique was performed for  global imaging of the pelvis including uterus, ovaries, adnexal regions, and pelvic cul-de-sac. It was necessary to proceed with endovaginal exam following the transabdominal exam to visualize the uterus and ovaries.  COMPARISON:  CT 05/06/2015.  FINDINGS: Uterus  Measurements: 11.6 x 5.2 x 6.9 cm. No fibroids or other mass visualized.  Endometrium  Thickness: 6.7 mm. No focal abnormality identified. No retained products of conception noted. Trace endometrial fluid.  Right ovary  Measurements: 3.1 x 2.1 x 2.9 cm. Normal appearance/no adnexal mass.   Left ovary  Measurements: 2.7 x 2.0 x 2.9 cm. Normal appearance/no adnexal mass.  Other findings  Large amount of free pelvic fluid.  IMPRESSION: 1. Large amount of free pelvic fluid. 2. No evidence of endometrial thickening or retained products of conception. Trace residual endometrial fluid.   Electronically Signed   By: Maisie Fus  Register   On: 05/07/2015 07:39   US Pelvis Complete  04/21/2015   CLINICAL DATA:  19 year old female 1 week postpartum presenting with vaginal bleeding.  EXAM: LIMITED ULTRASOUND OF PELVIS  TECHNIQUE: Limited transabdominal ultrasound examination of the pelvis was performed.  COMPARISON:  Obstetrical ultrasound dated 03/07/2015  FINDINGS: The uterus is enlarged and heterogeneous measuring 15 x 8 x 9 cm. Doppler images demonstrate diffuse hyperemia of the endometrium and surrounding myometrial tissue. An ill-defined heterogeneous predominantly solid collection may be present within the uterus measuring approximately 6 cm in thickness and is concerning for retained products of conception. Echogenic material noted within the lower uterine segment measuring approximately 2.2 cm in thickness likely representing blood clot.  The right ovary measures 2.2 x 1.3 x 1.8 cm and the left ovary measures 3.1 x 1.9 x 3.2 cm. The ovaries are grossly unremarkable.  IMPRESSION: Enlarged and hyperemic uterus with possible hyperemic collection. Findings may represent endometritis but are concerning for retained products of conception. Clinical correlation and obstetrical consult is advised.  These results were called by telephone at the time of interpretation on 04/21/2015 at 9:45 pm to Dr. Crista Curb , who verbally acknowledged these results.   Electronically Signed   By: Elgie Collard M.D.   On: 04/21/2015 21:45   Ct Abdomen Pelvis W Contrast  05/03/2015   CLINICAL DATA:  Abdominal pain, fever, chills. Post vaginal delivery 04/15/2015. Endometritis, recent D and C for retained products of conception.  Now with septic shock.  EXAM: CT ABDOMEN AND PELVIS WITH CONTRAST  TECHNIQUE: Multidetector CT imaging of the abdomen and pelvis was performed using the standard protocol following bolus administration of intravenous contrast.  CONTRAST:  90mL OMNIPAQUE IOHEXOL 300 MG/ML  SOLN  COMPARISON:  No prior CT.  Pelvic ultrasound 04/21/2015  FINDINGS: Lower chest: Minimal dependent atelectasis at the lung bases. No consolidation or pleural effusion.  Liver: Periportal edema.  No focal lesion.  Hepatobiliary: Distended with gallbladder wall thickening. No calcified gallstone. No biliary dilatation.  Pancreas: No ductal dilatation or surrounding inflammation. Homogeneous enhancement.  Spleen: Normal.  Adrenal glands: Normal, no nodule.  No hyper enhancement.  Kidneys: Heterogeneous enhancement of the right kidney with right urothelial thickening, urothelial enhancement, and perinephric stranding. Findings are consistent with pyelonephritis. There is no intrarenal or perirenal fluid collection. Homogeneous enhancement throughout the left kidney. No abnormal left urothelial enhancement.  Stomach/Bowel: Stomach is decompressed. There are no dilated or thickened small bowel loops. Normal of normal small bowel enhancement. Small volume of stool throughout the colon without colonic wall thickening. The appendix is not definitively identified.  Vascular/Lymphatic: Mild mesenteric and retroperitoneal haziness. No  retroperitoneal adenopathy. Abdominal aorta is normal in caliber.  Reproductive: Postpartum appearance of the uterus. Mild periuterine haziness. Ovaries are not discretely visualized.  Bladder: Physiologically distended.  Other: No free air or intra-abdominal fluid collection. Trace perihepatic fluid.  Musculoskeletal: There are no acute or suspicious osseous abnormalities.  IMPRESSION: 1. Findings consistent with right pyelonephritis.  No renal abscess. 2. Periportal edema and diffuse gallbladder wall thickening. There is  diffuse haziness of the mesentery and retroperitoneum, and small volume perihepatic ascites. Findings may be related to aggressive hydration and third-spacing in addition to reactive from the right pyelonephritis.   Electronically Signed   By: Rubye Oaks M.D.   On: 05/03/2015 22:28   Dg Chest Port 1 View  05/07/2015   CLINICAL DATA:  Intubation.  EXAM: PORTABLE CHEST - 1 VIEW  COMPARISON:  CT 05/06/2015.  Chest x-ray 05/06/2015.  FINDINGS: Endotracheal tube and NG tube in stable position. Left IJ line in stable position. Heart size stable. Progressive diffuse bilateral pulmonary infiltrates and/or edema. Right pleural effusion again noted. No pneumothorax.  IMPRESSION: 1. Lines and tubes in stable position. 2. Progressive diffuse bilateral pulmonary infiltrates and/or edema. Right pleural effusion again noted.   Electronically Signed   By: Maisie Fus  Register   On: 05/07/2015 07:49   Portable Chest Xray  05/06/2015   CLINICAL DATA:  Post intubation and central line placement.  EXAM: PORTABLE CHEST - 1 VIEW  COMPARISON:  Earlier the same day.  FINDINGS: There has been interval intubation with the endotracheal tube overlying the tracheal air column and terminating at the level of the clavicular heads. Enteric catheter transverses the thorax, tip collimated off the image. Left-sided internal jugular approach central venous catheter terminates within the expected location of right atrium.  Cardiomediastinal silhouette is normal for portable technique. Mediastinal contours appear intact.  There is no evidence of pneumothorax. There are bilateral interstitial and alveolar opacities, with right pleural effusion. Left subpulmonic effusion is not excluded.  Osseous structures are without acute abnormality. Soft tissues are grossly normal.  IMPRESSION: Status post intubation with endotracheal tube in satisfactory position.  Enteric catheter collimated off the image.  Central venous catheter with tip in the expected  location of the right atrium.  Appearance of the lungs suggestive of pulmonary edema, with right pleural effusion. Left subpulmonic effusion is not excluded.   Electronically Signed   By: Ted Mcalpine M.D.   On: 05/06/2015 15:02   Dg Chest Portable 1 View  05/03/2015   CLINICAL DATA:  Code sepsis. Patient with fever, chills and abdominal pains. Intense chest pressure. Shortness of breath yesterday.  EXAM: PORTABLE CHEST - 1 VIEW  COMPARISON:  None.  FINDINGS: Heart, mediastinum hila are unremarkable. Lungs are clear. No pleural effusion or pneumothorax. Skeletal structures are unremarkable.  IMPRESSION: No active disease.   Electronically Signed   By: Amie Portland M.D.   On: 05/03/2015 19:01   Ct Angio Chest Aorta W/cm &/or Wo/cm  05/06/2015   EXAM: CT ANGIOGRAPHY CHEST, ABDOMEN AND PELVIS  TECHNIQUE: Multidetector CT imaging through the chest, abdomen and pelvis was performed using the standard protocol during bolus administration of intravenous contrast. Multiplanar reconstructed images and MIPs were obtained and reviewed to evaluate the vascular anatomy.  CONTRAST:  OMNIPAQUE IOHEXOL 350 MG/ML SOLN  COMPARISON:  CT scan abdomen and pelvis 05/03/2015  FINDINGS: CTA CHEST FINDINGS  Mediastinum: The patient is intubated. The tip of the endotracheal tube terminates just above the carina. A nasogastric tube is present and  terminates in the gastric antrum. There is a left IJ approach central venous catheter. The tip of the catheter terminates at the superior cavoatrial junction. Unremarkable thyroid gland and thoracic inlet.  Heart/Vascular: Conventional 3 vessel aortic arch. No aneurysm or dissection. Excellent opacification of the pulmonary arteries. No evidence of pulmonary embolus. The heart is within normal limits for size. No pericardial effusion.  Lungs/Pleura: Moderate right and small left layering pleural effusions with significant bilateral left lower lobe atelectasis. Superimposed  infiltrate difficult to exclude.  Bones/Soft Tissues: No acute fracture or aggressive appearing lytic or blastic osseous lesion.  Review of the MIP images confirms the above findings.  CTA ABDOMEN AND PELVIS FINDINGS  VASCULAR  Aorta: Normal caliber abdominal aorta. No evidence of aneurysm or dissection.  Celiac: The left hepatic artery is replaced to the left gastric artery.  SMA: Widely patent and unremarkable.  Renals: Single dominant renal arteries bilaterally which are widely patent and unremarkable.  IMA: Widely patent and unremarkable.  Inflow: Widely patent and unremarkable.  Proximal Outflow: Widely patent and unremarkable.  Veins: No focal venous abnormality identified.  NON-VASCULAR  Abdomen: A combination of streak artifact from the arms and arterial phase timing slightly limits evaluation of the abdomen and pelvis. The nasogastric tube terminates in the gastric antrum. Unremarkable CT appearance of the stomach, duodenum, spleen, adrenal glands and pancreas. The liver is normal in size. There is diffuse periportal edema and extensive thickening of the gallbladder wall. The gallbladder lumen itself is not distended. The portal veins remain patent.  Diffusely enlarged and edematous right kidney with patchy arterial phase enhancement is most consistent with severe pyelonephritis. Mild fullness of the right renal collecting system without true hydronephrosis. Minimal patchy enhancement of the left kidney favored to be secondary to streak artifact rather than true parenchymal edema.  Small volume free fluid in the bilateral pericolic gutters and anatomic pelvis.  Pelvis: Free fluid layers within the pelvis. Hyperenhancing and post gravid appearance of the uterus. Foley catheter is present within the bladder.  Bones/Soft Tissues: Reticulation of the subcutaneous fat consistent with mild anasarca. No acute fracture or aggressive appearing lytic or blastic osseous lesion.  Review of the MIP images confirms the  above findings.  IMPRESSION: CTA CHEST  1. No evidence of acute aortic dissection or other arterial abnormality. 2. No evidence of acute pulmonary embolus. 3. Moderate right and small left layering pleural effusions. No associated pulmonary edema. 4. Lobar atelectasis of the bilateral lower lobes. Superimposed infiltrate or aspiration is difficult to exclude entirely. 5. The tip of the endotracheal tube terminates above the carina. 6. The tip of the left IJ approach central venous catheter terminates at the superior cavoatrial junction.  CTA ABD/PELVIS  1. No evidence of acute aortic or other arterial abnormality. 2. Findings remain consistent with severe right-sided pyelonephritis. 3. There may be mild fullness of the right renal collecting system without true hydronephrosis. 4. Periportal edema, diffuse thickening of the gallbladder wall, subcutaneous anasarca and free fluid within the abdomen favored to be secondary to third spacing in the setting of sepsis and aggressive hydration. 5. The portal veins remain patent. 6. The bladder contains a moderate amount of urine despite the presence of the Foley catheter.   Electronically Signed   By: Malachy Moan M.D.   On: 05/06/2015 18:45   Ct Angio Abd/pel W/ And/or W/o  05/06/2015   EXAM: CT ANGIOGRAPHY CHEST, ABDOMEN AND PELVIS  TECHNIQUE: Multidetector CT imaging through the chest, abdomen and pelvis was  performed using the standard protocol during bolus administration of intravenous contrast. Multiplanar reconstructed images and MIPs were obtained and reviewed to evaluate the vascular anatomy.  CONTRAST:  OMNIPAQUE IOHEXOL 350 MG/ML SOLN  COMPARISON:  CT scan abdomen and pelvis 05/03/2015  FINDINGS: CTA CHEST FINDINGS  Mediastinum: The patient is intubated. The tip of the endotracheal tube terminates just above the carina. A nasogastric tube is present and terminates in the gastric antrum. There is a left IJ approach central venous catheter. The tip of  the catheter terminates at the superior cavoatrial junction. Unremarkable thyroid gland and thoracic inlet.  Heart/Vascular: Conventional 3 vessel aortic arch. No aneurysm or dissection. Excellent opacification of the pulmonary arteries. No evidence of pulmonary embolus. The heart is within normal limits for size. No pericardial effusion.  Lungs/Pleura: Moderate right and small left layering pleural effusions with significant bilateral left lower lobe atelectasis. Superimposed infiltrate difficult to exclude.  Bones/Soft Tissues: No acute fracture or aggressive appearing lytic or blastic osseous lesion.  Review of the MIP images confirms the above findings.  CTA ABDOMEN AND PELVIS FINDINGS  VASCULAR  Aorta: Normal caliber abdominal aorta. No evidence of aneurysm or dissection.  Celiac: The left hepatic artery is replaced to the left gastric artery.  SMA: Widely patent and unremarkable.  Renals: Single dominant renal arteries bilaterally which are widely patent and unremarkable.  IMA: Widely patent and unremarkable.  Inflow: Widely patent and unremarkable.  Proximal Outflow: Widely patent and unremarkable.  Veins: No focal venous abnormality identified.  NON-VASCULAR  Abdomen: A combination of streak artifact from the arms and arterial phase timing slightly limits evaluation of the abdomen and pelvis. The nasogastric tube terminates in the gastric antrum. Unremarkable CT appearance of the stomach, duodenum, spleen, adrenal glands and pancreas. The liver is normal in size. There is diffuse periportal edema and extensive thickening of the gallbladder wall. The gallbladder lumen itself is not distended. The portal veins remain patent.  Diffusely enlarged and edematous right kidney with patchy arterial phase enhancement is most consistent with severe pyelonephritis. Mild fullness of the right renal collecting system without true hydronephrosis. Minimal patchy enhancement of the left kidney favored to be secondary to  streak artifact rather than true parenchymal edema.  Small volume free fluid in the bilateral pericolic gutters and anatomic pelvis.  Pelvis: Free fluid layers within the pelvis. Hyperenhancing and post gravid appearance of the uterus. Foley catheter is present within the bladder.  Bones/Soft Tissues: Reticulation of the subcutaneous fat consistent with mild anasarca. No acute fracture or aggressive appearing lytic or blastic osseous lesion.  Review of the MIP images confirms the above findings.  IMPRESSION: CTA CHEST  1. No evidence of acute aortic dissection or other arterial abnormality. 2. No evidence of acute pulmonary embolus. 3. Moderate right and small left layering pleural effusions. No associated pulmonary edema. 4. Lobar atelectasis of the bilateral lower lobes. Superimposed infiltrate or aspiration is difficult to exclude entirely. 5. The tip of the endotracheal tube terminates above the carina. 6. The tip of the left IJ approach central venous catheter terminates at the superior cavoatrial junction.  CTA ABD/PELVIS  1. No evidence of acute aortic or other arterial abnormality. 2. Findings remain consistent with severe right-sided pyelonephritis. 3. There may be mild fullness of the right renal collecting system without true hydronephrosis. 4. Periportal edema, diffuse thickening of the gallbladder wall, subcutaneous anasarca and free fluid within the abdomen favored to be secondary to third spacing in the setting of sepsis and  aggressive hydration. 5. The portal veins remain patent. 6. The bladder contains a moderate amount of urine despite the presence of the Foley catheter.   Electronically Signed   By: Malachy Moan M.D.   On: 05/06/2015 18:45    Microbiology: Recent Results (from the past 240 hour(s))  Blood Culture (routine x 2)     Status: None   Collection Time: 05/03/15  6:15 PM  Result Value Ref Range Status   Specimen Description BLOOD LEFT FOREARM  Final   Special Requests  BOTTLES DRAWN AEROBIC AND ANAEROBIC 5CC  Final   Culture  Setup Time   Final    GRAM NEGATIVE RODS IN BOTH AEROBIC AND ANAEROBIC BOTTLES CRITICAL RESULT CALLED TO, READ BACK BY AND VERIFIED WITH: T DAVIS,RN AT 0846 05/04/15 BY L BENFIELD    Culture ESCHERICHIA COLI  Final   Report Status 05/06/2015 FINAL  Final   Organism ID, Bacteria ESCHERICHIA COLI  Final      Susceptibility   Escherichia coli - MIC*    AMPICILLIN <=2 SENSITIVE Sensitive     CEFAZOLIN <=4 SENSITIVE Sensitive     CEFEPIME <=1 SENSITIVE Sensitive     CEFTAZIDIME <=1 SENSITIVE Sensitive     CEFTRIAXONE <=1 SENSITIVE Sensitive     CIPROFLOXACIN <=0.25 SENSITIVE Sensitive     GENTAMICIN <=1 SENSITIVE Sensitive     IMIPENEM <=0.25 SENSITIVE Sensitive     TRIMETH/SULFA <=20 SENSITIVE Sensitive     AMPICILLIN/SULBACTAM <=2 SENSITIVE Sensitive     PIP/TAZO <=4 SENSITIVE Sensitive     * ESCHERICHIA COLI  Blood Culture (routine x 2)     Status: None   Collection Time: 05/03/15  7:00 PM  Result Value Ref Range Status   Specimen Description BLOOD LEFT HAND  Final   Special Requests   Final    BOTTLES DRAWN AEROBIC AND ANAEROBIC 10CC POF VANC AND ZOSEYN   Culture NO GROWTH 5 DAYS  Final   Report Status 05/08/2015 FINAL  Final  Urine culture     Status: None   Collection Time: 05/03/15  8:08 PM  Result Value Ref Range Status   Specimen Description URINE, CLEAN CATCH  Final   Special Requests NONE  Final   Culture >=100,000 COLONIES/mL ESCHERICHIA COLI  Final   Report Status 05/05/2015 FINAL  Final   Organism ID, Bacteria ESCHERICHIA COLI  Final      Susceptibility   Escherichia coli - MIC*    AMPICILLIN <=2 SENSITIVE Sensitive     CEFAZOLIN <=4 SENSITIVE Sensitive     CEFTRIAXONE <=1 SENSITIVE Sensitive     CIPROFLOXACIN <=0.25 SENSITIVE Sensitive     GENTAMICIN <=1 SENSITIVE Sensitive     IMIPENEM <=0.25 SENSITIVE Sensitive     NITROFURANTOIN <=16 SENSITIVE Sensitive     TRIMETH/SULFA <=20 SENSITIVE Sensitive      AMPICILLIN/SULBACTAM <=2 SENSITIVE Sensitive     PIP/TAZO <=4 SENSITIVE Sensitive     * >=100,000 COLONIES/mL ESCHERICHIA COLI  MRSA PCR Screening     Status: None   Collection Time: 05/04/15  7:00 AM  Result Value Ref Range Status   MRSA by PCR NEGATIVE NEGATIVE Final    Comment:        The GeneXpert MRSA Assay (FDA approved for NASAL specimens only), is one component of a comprehensive MRSA colonization surveillance program. It is not intended to diagnose MRSA infection nor to guide or monitor treatment for MRSA infections.   Culture, blood (routine x 2)     Status: None (  Preliminary result)   Collection Time: 05/06/15  9:45 AM  Result Value Ref Range Status   Specimen Description BLOOD LEFT HAND  Final   Special Requests BOTTLES DRAWN AEROBIC AND ANAEROBIC  5CC  Final   Culture NO GROWTH 3 DAYS  Final   Report Status PENDING  Incomplete  Culture, blood (routine x 2)     Status: None (Preliminary result)   Collection Time: 05/06/15  9:51 AM  Result Value Ref Range Status   Specimen Description BLOOD RIGHT ANTECUBITAL  Final   Special Requests BOTTLES DRAWN AEROBIC AND ANAEROBIC 5CC  Final   Culture NO GROWTH 3 DAYS  Final   Report Status PENDING  Incomplete     Labs: Basic Metabolic Panel:  Recent Labs Lab 05/04/15 1900  05/05/15 1445  05/07/15 0459 05/07/15 1955 05/08/15 0315 05/09/15 0514 05/10/15 0503  NA  --   < > 139  < > 137 136 136 134* 133*  K  --   < > 3.5  < > 3.4* 3.1* 3.2* 3.6 4.1  CL  --   < > 114*  < > 108 104 105 102 102  CO2  --   < > 18*  < > 22 24 26 27 23   GLUCOSE  --   < > 92  < > 95 121* 97 97 85  BUN  --   < > 7  < > 5* <5* <5* 6 6  CREATININE  --   < > 1.16*  < > 1.06* 1.08* 0.92 0.98 0.80  CALCIUM  --   < > 7.6*  < > 7.4* 7.5* 7.4* 7.9* 8.2*  MG 1.1*  --  2.7*  --  1.4* 2.5*  --   --  1.5*  PHOS  --   --   --   --  1.5* 5.0*  --   --   --   < > = values in this interval not displayed. Liver Function Tests:  Recent Labs Lab  05/03/15 1820 05/04/15 0600 05/05/15 0225 05/06/15 0310 05/09/15 0514  AST 25 28 19 24 25   ALT 12* 14 12* 14 17  ALKPHOS 140* 149* 123 136* 198*  BILITOT 1.4* 2.3* 2.0* 2.0* 1.2  PROT 6.0* 5.2* 4.5* 4.7* 6.1*  ALBUMIN 2.5* 1.9* 1.7* 1.6* 2.0*    Recent Labs Lab 05/03/15 1820  LIPASE 14*   No results for input(s): AMMONIA in the last 168 hours. CBC:  Recent Labs Lab 05/03/15 1820  05/06/15 0310 05/07/15 0459 05/07/15 1955 05/08/15 0315 05/09/15 0514  WBC 17.3*  < > 3.8* 5.1 8.3 7.1 8.3  NEUTROABS 15.5*  --   --   --   --   --  5.4  HGB 8.9*  < > 8.0* 7.0* 6.5* 7.8* 8.3*  HCT 27.8*  < > 24.6* 21.4* 19.8* 23.5* 25.1*  MCV 80.3  < > 76.9* 77.0* 75.3* 76.8* 77.7*  PLT 275  < > 128* 89* 85* 73* 110*  < > = values in this interval not displayed. Cardiac Enzymes:  Recent Labs Lab 05/06/15 1437 05/06/15 1907 05/07/15 0051  TROPONINI 0.09* 0.04* 0.03   BNP: BNP (last 3 results) No results for input(s): BNP in the last 8760 hours.  ProBNP (last 3 results) No results for input(s): PROBNP in the last 8760 hours.  CBG:  Recent Labs Lab 05/06/15 1609  GLUCAP 110*       Signed:  Carolyne Littles, MD [facility] [phone] pager

## 2015-05-10 NOTE — Progress Notes (Signed)
Pt provided with discharge information including instruction on where to pick up prescriptions and follow up appointments. Reviewed new medications with pt and pt verbalized understanding. IV was dc'd without complication. Pt refused wheelchair and was escorted out via this RN.  Carrie Mew, RN

## 2015-05-10 NOTE — Progress Notes (Signed)
CSW (Clinical Child psychotherapist) received consult for follow-up appointments. Consult more appropriate for RNCM. CSW notified RNCM. No social work needs identified at this time.   Deanna Sullivan Weekend CSW [phone]

## 2015-05-10 NOTE — Progress Notes (Signed)
Discharge plans noted. On aspirin and metoprolol. Follow-up with Dr. Gala Romney in the advanced CHF clinic.  Chrystie Nose, MD, [facility] Attending Cardiologist [facility]

## 2015-05-11 LAB — CULTURE, BLOOD (ROUTINE X 2)
CULTURE: NO GROWTH
Culture: NO GROWTH

## 2015-05-11 LAB — TYPE AND SCREEN
ABO/RH(D): O POS
Antibody Screen: POSITIVE
DAT, IGG: NEGATIVE
DONOR AG TYPE: NEGATIVE
UNIT DIVISION: 0
UNIT DIVISION: 0

## 2015-05-28 ENCOUNTER — Ambulatory Visit: Payer: Medicaid Other | Admitting: Obstetrics & Gynecology

## 2015-06-19 ENCOUNTER — Ambulatory Visit: Payer: Medicaid Other | Admitting: Advanced Practice Midwife

## 2015-07-18 ENCOUNTER — Ambulatory Visit: Payer: Medicaid Other | Admitting: Family Medicine

## 2016-01-08 ENCOUNTER — Ambulatory Visit: Payer: Medicaid Other | Admitting: Obstetrics and Gynecology

## 2016-01-30 ENCOUNTER — Ambulatory Visit: Payer: Self-pay | Admitting: Obstetrics & Gynecology

## 2016-02-18 ENCOUNTER — Ambulatory Visit: Payer: Self-pay | Admitting: Obstetrics & Gynecology

## 2016-02-18 IMAGING — NM NM PULMONARY PERF PARTICULATE
8 series · 8 of 8 positions shown · non-contrast
Comparison: Chest radiograph 05/06/2015

CLINICAL DATA: Recent postpartum patient was actively breastfeeding
with right-sided abdominal and back pain.

EXAM:
NUCLEAR MEDICINE VENTILATION AND PERFUSION SCAN
TECHNIQUE: Perfusion images were obtained in multiple projections after
intravenous injection of radiopharmaceutical.
RADIOPHARMACEUTICALS:  3 mCi IcNNm MAA IV

[Series 1: lt lat/rt lat perf · 4.14mm/px · 1 of 1 slices shown (1 of 2)]
[im 1/1]
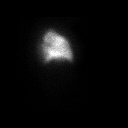

[Series 1: lt lat/rt lat perf · 4.14mm/px · 1 of 1 slices shown (2 of 2)]
[im 1/1]
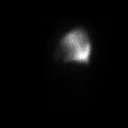

[Series 2: lpo/rao perf · 4.14mm/px · 1 of 1 slices shown (1 of 2)]
[im 1/1]
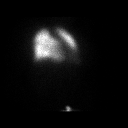

[Series 2: lpo/rao perf · 4.14mm/px · 1 of 1 slices shown (2 of 2)]
[im 1/1]
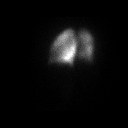

[Series 3: ant/post perf · 4.14mm/px · 1 of 1 slices shown (1 of 2)]
[im 1/1]
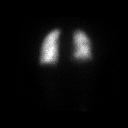

[Series 3: ant/post perf · 4.14mm/px · 1 of 1 slices shown (2 of 2)]
[im 1/1]
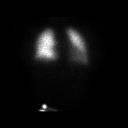

[Series 4: lao/rpo perf · 4.14mm/px · 1 of 1 slices shown (1 of 2)]
[im 1/1]
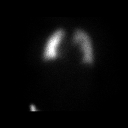

[Series 4: lao/rpo perf · 4.14mm/px · 1 of 1 slices shown (2 of 2)]
[im 1/1]
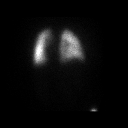

[8 of 8 positions shown; findings below may reference images not displayed]

FINDINGS: Profusion images demonstrate no profusion abnormality. Radiotracer
is homogeneously distributed throughout the lungs bilaterally.
IMPRESSION: Very low probability for pulmonary embolism (PE absent).

## 2016-02-19 IMAGING — CR DG CHEST 1V PORT
1 series · 1 of 1 positions shown · non-contrast
Comparison: CT 05/06/2015.  Chest x-ray 05/06/2015.

CLINICAL DATA: Intubation.

EXAM:
PORTABLE CHEST - 1 VIEW

[AP]
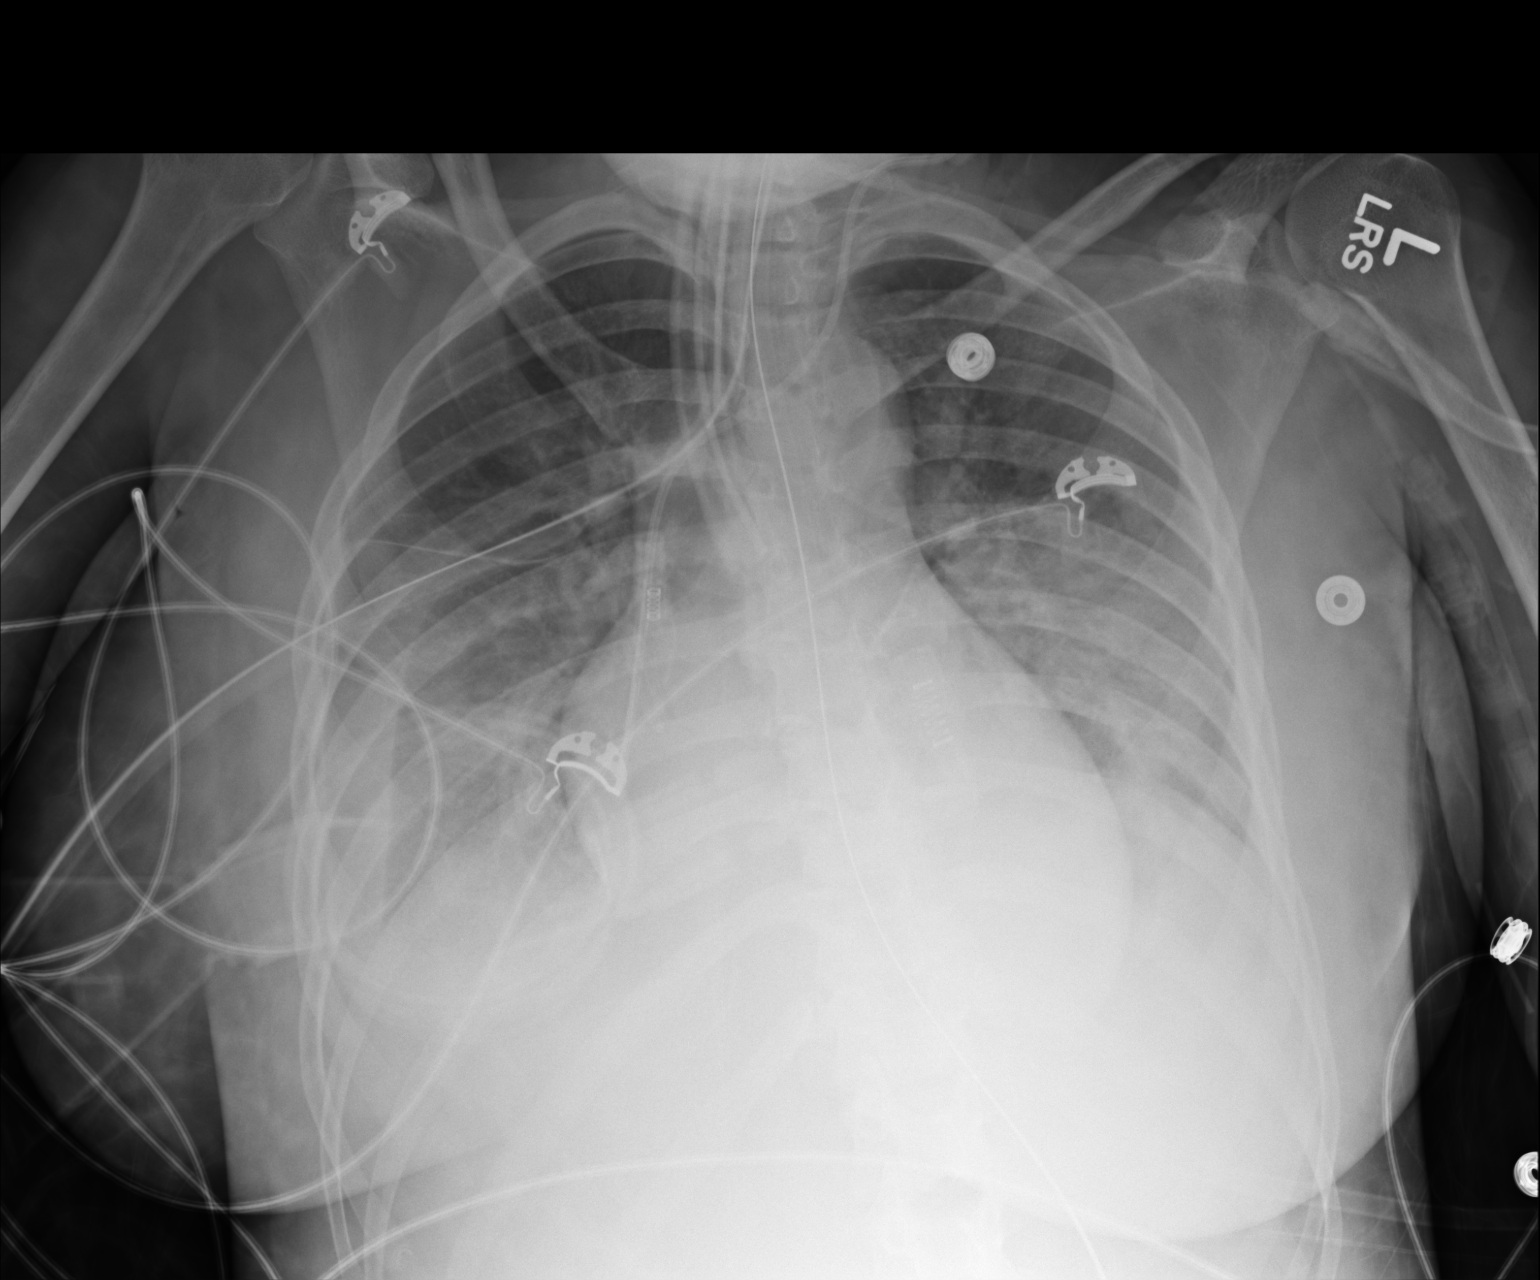

[1 of 1 positions shown; findings below may reference images not displayed]

FINDINGS: Endotracheal tube and NG tube in stable position. Left IJ line in
stable position. Heart size stable. Progressive diffuse bilateral
pulmonary infiltrates and/or edema. Right pleural effusion again
noted. No pneumothorax.
IMPRESSION: 1. Lines and tubes in stable position.
2. Progressive diffuse bilateral pulmonary infiltrates and/or edema.
Right pleural effusion again noted.

## 2016-03-11 ENCOUNTER — Encounter: Payer: Self-pay | Admitting: Family Medicine

## 2016-03-11 ENCOUNTER — Other Ambulatory Visit (HOSPITAL_COMMUNITY)
Admission: RE | Admit: 2016-03-11 | Discharge: 2016-03-11 | Disposition: A | Discharge status: Home / Self Care | Payer: Medicaid Other | Source: Ambulatory Visit | Attending: Family Medicine | Admitting: Family Medicine

## 2016-03-11 ENCOUNTER — Ambulatory Visit (INDEPENDENT_AMBULATORY_CARE_PROVIDER_SITE_OTHER): Payer: Medicaid Other | Admitting: Family Medicine

## 2016-03-11 VITALS — BP 109/82 | HR 88 | Ht 63.0 in | Wt 117.0 lb

## 2016-03-11 DIAGNOSIS — Z113 Encounter for screening for infections with a predominantly sexual mode of transmission: Secondary | ICD-10-CM

## 2016-03-11 DIAGNOSIS — Z3046 Encounter for surveillance of implantable subdermal contraceptive: Secondary | ICD-10-CM | POA: Diagnosis not present

## 2016-03-11 MED ORDER — MEDROXYPROGESTERONE ACETATE 150 MG/ML IM SUSP
150.0000 mg | Freq: Once | INTRAMUSCULAR | Status: AC
  Administered 2016-03-11: 150 mg via INTRAMUSCULAR

## 2016-03-11 NOTE — Progress Notes (Signed)
   Subjective:    Patient ID: Deanna Sullivan, female    DOB: [birth year], 20 y.o.   MRN: [id number]  HPI Patient here for STD exposure.  She also wants nexplanon out.   Review of Systems  Constitutional: Negative for chills, fatigue and fever.  Gastrointestinal: Negative for abdominal pain.  Genitourinary: Negative for dyspareunia, vaginal bleeding, vaginal discharge and vaginal pain.       Objective:   Physical Exam  Constitutional: She appears well-developed and well-nourished.  HENT:  Head: Normocephalic and atraumatic.  Right Ear: External ear normal.  Abdominal: Soft. Bowel sounds are normal. She exhibits no distension and no mass. There is no tenderness. There is no rebound and no guarding.  Genitourinary: There is no rash, tenderness, lesion or injury on the right labia. There is no rash, tenderness, lesion or injury on the left labia. No erythema, tenderness or bleeding in the vagina. No foreign body in the vagina. No signs of injury around the vagina. No vaginal discharge found.  Skin: Skin is warm and dry.      Assessment & Plan:  1. Screen for STD (sexually transmitted disease) - RPR - HIV antibody (with reflex) - Hepatitis B Surface AntiGEN - GC/Chlamydia probe amp ([place])not at [facility]  2. Encounter for Nexplanon removal See procedure note below.  Wants depo instead.     Nexplanon Removal:  Patient given informed consent for removal of her Implanon, time out was performed.  Signed copy in the chart.  Appropriate time out taken. Implanon site identified.  Area prepped in usual sterile fashon. One cc of 1% lidocaine was used to anesthetize the area at the distal end of the implant. A small stab incision was made right beside the implant on the distal portion.  The implanon rod was grasped using hemostats and removed without difficulty.  There was less than 3 cc blood loss. There were no complications.  A small amount of antibiotic ointment and steri-strips were  applied over the small incision.  A pressure bandage was applied to reduce any bruising.  The patient tolerated the procedure well and was given post procedure instructions.

## 2016-03-11 NOTE — Addendum Note (Signed)
Addended by: Kathee Delton on: 03/11/2016 04:50 PM   Modules accepted: Orders

## 2016-03-11 NOTE — Addendum Note (Signed)
Addended by: Kathee Delton on: 03/11/2016 05:20 PM   Modules accepted: Orders

## 2016-03-12 LAB — HIV ANTIBODY (ROUTINE TESTING W REFLEX): HIV 1&2 Ab, 4th Generation: REACTIVE — AB

## 2016-03-12 LAB — HIV 1/2 CONFIRMATION
HIV 1 ANTIBODY: NEGATIVE
HIV-2 Ab: NEGATIVE

## 2016-03-12 LAB — HEPATITIS B SURFACE ANTIGEN: HEP B S AG: NEGATIVE

## 2016-03-12 LAB — RPR

## 2016-03-12 LAB — WET PREP, GENITAL
TRICH WET PREP: NONE SEEN
YEAST WET PREP: NONE SEEN

## 2016-03-15 LAB — GC/CHLAMYDIA PROBE AMP (~~LOC~~) NOT AT ARMC
CHLAMYDIA, DNA PROBE: POSITIVE — AB
NEISSERIA GONORRHEA: NEGATIVE

## 2016-03-16 ENCOUNTER — Telehealth: Payer: Self-pay | Admitting: General Practice

## 2016-03-16 DIAGNOSIS — A749 Chlamydial infection, unspecified: Secondary | ICD-10-CM

## 2016-03-16 MED ORDER — AZITHROMYCIN 250 MG PO TABS
1000.0000 mg | ORAL_TABLET | Freq: Once | ORAL | 0 refills | Status: AC
Start: 2016-03-16 — End: 2016-03-16

## 2016-03-16 NOTE — Telephone Encounter (Signed)
Patient + for chlamydia. STD card sent & med sent to pharmacy. Called patient, no answer- left message to call us back for results. Will send mychart message.

## 2016-03-17 ENCOUNTER — Ambulatory Visit: Payer: Self-pay | Admitting: Obstetrics & Gynecology

## 2016-03-23 LAB — HIV-1 RNA, QUALITATIVE, TMA

## 2016-03-30 ENCOUNTER — Encounter: Payer: Self-pay | Admitting: General Practice

## 2016-04-12 ENCOUNTER — Ambulatory Visit: Payer: Medicaid Other | Admitting: Obstetrics and Gynecology

## 2016-08-12 ENCOUNTER — Encounter (HOSPITAL_COMMUNITY): Payer: Self-pay | Admitting: Emergency Medicine

## 2016-08-12 ENCOUNTER — Emergency Department (HOSPITAL_COMMUNITY): Payer: Medicaid Other

## 2016-08-12 ENCOUNTER — Emergency Department (HOSPITAL_COMMUNITY)
Admission: EM | Admit: 2016-08-12 | Discharge: 2016-08-12 | Disposition: A | Discharge status: Home / Self Care | Payer: Medicaid Other | Attending: Emergency Medicine | Admitting: Emergency Medicine

## 2016-08-12 DIAGNOSIS — R51 Headache: Secondary | ICD-10-CM | POA: Insufficient documentation

## 2016-08-12 DIAGNOSIS — M25561 Pain in right knee: Secondary | ICD-10-CM | POA: Insufficient documentation

## 2016-08-12 DIAGNOSIS — Y939 Activity, unspecified: Secondary | ICD-10-CM | POA: Diagnosis not present

## 2016-08-12 DIAGNOSIS — F17290 Nicotine dependence, other tobacco product, uncomplicated: Secondary | ICD-10-CM | POA: Diagnosis not present

## 2016-08-12 DIAGNOSIS — R519 Headache, unspecified: Secondary | ICD-10-CM

## 2016-08-12 DIAGNOSIS — Y92410 Unspecified street and highway as the place of occurrence of the external cause: Secondary | ICD-10-CM | POA: Diagnosis not present

## 2016-08-12 DIAGNOSIS — Y999 Unspecified external cause status: Secondary | ICD-10-CM | POA: Insufficient documentation

## 2016-08-12 MED ORDER — METHOCARBAMOL 500 MG PO TABS: 500.0000 mg | ORAL_TABLET | Freq: Every evening | ORAL | 0 refills | Status: DC | PRN

## 2016-08-12 MED ORDER — METHOCARBAMOL 500 MG PO TABS
1000.0000 mg | ORAL_TABLET | Freq: Once | ORAL | Status: AC
  Administered 2016-08-12: 1000 mg via ORAL
  Filled 2016-08-12: qty 2

## 2016-08-12 MED ORDER — TRAMADOL HCL 50 MG PO TABS
100.0000 mg | ORAL_TABLET | Freq: Once | ORAL | Status: AC
  Administered 2016-08-12: 100 mg via ORAL
  Filled 2016-08-12: qty 2

## 2016-08-12 MED ORDER — IBUPROFEN 600 MG PO TABS: 600.0000 mg | ORAL_TABLET | Freq: Four times a day (QID) | ORAL | 0 refills | Status: DC | PRN

## 2016-08-12 MED ORDER — IBUPROFEN 200 MG PO TABS
400.0000 mg | ORAL_TABLET | Freq: Once | ORAL | Status: AC | PRN
  Administered 2016-08-12: 400 mg via ORAL
  Filled 2016-08-12: qty 2

## 2016-08-12 NOTE — Discharge Instructions (Signed)
Take anti-inflammatory medicines for the next week. Take this medicine with food. °Take muscle relaxer at bedtime to help you sleep. This medicine makes you drowsy so do not take before driving or work °Use a heating pad for sore muscles - use for 20 minutes several times a day ° °
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
## 2016-09-07 ENCOUNTER — Ambulatory Visit: Payer: Medicaid Other | Admitting: Obstetrics & Gynecology

## 2016-09-16 ENCOUNTER — Emergency Department (HOSPITAL_COMMUNITY)
Admission: EM | Admit: 2016-09-16 | Discharge: 2016-09-16 | Disposition: A | Discharge status: Home / Self Care | Payer: Medicaid Other | Attending: Emergency Medicine | Admitting: Emergency Medicine

## 2016-09-16 ENCOUNTER — Encounter (HOSPITAL_COMMUNITY): Payer: Self-pay

## 2016-09-16 DIAGNOSIS — R69 Illness, unspecified: Secondary | ICD-10-CM

## 2016-09-16 DIAGNOSIS — R05 Cough: Secondary | ICD-10-CM | POA: Diagnosis present

## 2016-09-16 DIAGNOSIS — F17290 Nicotine dependence, other tobacco product, uncomplicated: Secondary | ICD-10-CM | POA: Insufficient documentation

## 2016-09-16 DIAGNOSIS — J111 Influenza due to unidentified influenza virus with other respiratory manifestations: Secondary | ICD-10-CM | POA: Diagnosis not present

## 2016-09-16 MED ORDER — GUAIFENESIN 100 MG/5ML PO LIQD: 100.0000 mg | ORAL | 0 refills | Status: DC | PRN

## 2016-09-16 MED ORDER — ONDANSETRON 4 MG PO TBDP
4.0000 mg | ORAL_TABLET | Freq: Once | ORAL | Status: AC
  Administered 2016-09-16: 4 mg via ORAL
  Filled 2016-09-16: qty 1

## 2016-09-16 MED ORDER — ONDANSETRON 4 MG PO TBDP: 4.0000 mg | ORAL_TABLET | Freq: Three times a day (TID) | ORAL | 0 refills | Status: DC | PRN

## 2016-09-16 MED ORDER — ACETAMINOPHEN 325 MG PO TABS: 650.0000 mg | ORAL_TABLET | Freq: Once | ORAL | Status: DC

## 2016-09-16 MED ORDER — HYDROCODONE-HOMATROPINE 5-1.5 MG/5ML PO SYRP: 5.0000 mL | ORAL_SOLUTION | Freq: Four times a day (QID) | ORAL | 0 refills | Status: DC | PRN

## 2016-09-16 MED ORDER — FLUTICASONE PROPIONATE 50 MCG/ACT NA SUSP: 2.0000 | Freq: Every day | NASAL | 0 refills | Status: DC

## 2016-09-16 MED ORDER — BENZONATATE 100 MG PO CAPS: 100.0000 mg | ORAL_CAPSULE | Freq: Three times a day (TID) | ORAL | 0 refills | Status: DC | PRN

## 2016-09-16 MED ORDER — ACETAMINOPHEN 325 MG PO TABS
650.0000 mg | ORAL_TABLET | Freq: Once | ORAL | Status: AC
  Administered 2016-09-16: 650 mg via ORAL
  Filled 2016-09-16: qty 2

## 2016-09-16 NOTE — ED Notes (Signed)
Pt drinking water 

## 2016-09-16 NOTE — Discharge Instructions (Signed)
Your symptoms are most likely due to a viral upper respiratory infection, possibly the flu. You have been prescribed Flonase for nasal congestion, Robitussin for chest congestion and phlegm, Hycodan syrup for nighttime disruptive cough and body aches and Tessalon Perles for daytime cough. Please take Zofran for nausea as needed during the day. Please be sure you drink enough water to stay hydrated and rest.

## 2016-09-16 NOTE — ED Triage Notes (Signed)
Patient here with body aches, headache, chills and cough x 2 days, not taking otc meds, NAD. Alert and oriented

## 2016-09-16 NOTE — ED Provider Notes (Signed)
[facility]PT Provider Note   CSN: [id number] Arrival date & time: 09/16/16  4098  By signing my name below, I, Rosario Adie, attest that this documentation has been prepared under the direction and in the presence of Sharen Heck, PA-C.  Electronically Signed: Rosario Adie, ED Scribe. 09/16/16. 9:30 AM.  History   Chief Complaint Chief Complaint  Patient presents with  . Influenza   The history is provided by the patient. No language interpreter was used.    HPI Comments: Deanna Sullivan is a 21 y.o. female who presents to the Emergency Department complaining of gradually worsening, persistent cough onset two days ago. She reports associated generalized weakness, headache, chills, subjective fever, nasal congestion, generalized myalgias/arthralgias, rhinorrhea, sore throat, nausea, and vomiting. Pt has had decreased food and fluid intake since the onset of her symptoms. No treatments for her symptoms were tried prior to coming into the ED. Her boyfriend was recently sick with similar symptoms. Pt did not receive her seasonal influenza vaccination this year. She denies abdominal pain, dysuria, diarrhea, constipation, vaginal discharge/bleeding, or any other associated symptoms.   Past Medical History:  Diagnosis Date  . Anemia    Patient Active Problem List   Diagnosis Date Noted  . Leukocytosis   . Pyelonephritis   . Peripartum cardiomyopathy   . Aortic dissection (HCC)   . Back pain   . Acute respiratory failure with hypoxemia (HCC)   . Septic shock (HCC)   . Metabolic acidosis   . Acute pyelonephritis   . Severe sepsis (HCC)   . Bacteremia, escherichia coli   . Fever and chills   . Acute renal failure syndrome (HCC)   . Hypokalemia   . Hypomagnesemia   . Sinus tachycardia   . Endometritis complicating pregnancy   . Retained products of conception after delivery with complications   . Normocytic anemia   . Sepsis (HCC) 05/03/2015  . Postpartum  endometritis 04/21/2015  . Retained products of conception with hemorrhage   . Nexplanon in place; placed postpartum in hospital on 04/16/15 04/16/2015  . NSVD (normal spontaneous vaginal delivery) on 04/15/2015 04/15/2015   Past Surgical History:  Procedure Laterality Date  . DILATION AND EVACUATION N/A 04/22/2015   Procedure: DILATATION AND EVACUATION;  Surgeon: Adam Phenix, MD;  Location: [facility];  Service: Gynecology;  Laterality: N/A;   OB History    Gravida Para Term Preterm AB Living   1 1 1     1    SAB TAB Ectopic Multiple Live Births         0 1     Home Medications    Prior to Admission medications   Medication Sig Start Date End Date Taking? Authorizing Provider  benzonatate (TESSALON PERLES) 100 MG capsule Take 1 capsule (100 mg total) by mouth 3 (three) times daily as needed for cough. FOR DAY TIME DISRUPTIVE COUGH 09/16/16   Liberty Handy, PA-C  fluticasone [facility]) 50 MCG/ACT nasal spray Place 2 sprays into both nostrils daily. 09/16/16   Liberty Handy, PA-C  guaiFENesin (ROBITUSSIN) 100 MG/5ML liquid Take 5-10 mLs (100-200 mg total) by mouth every 4 (four) hours as needed for cough. FOR CHEST CONGESTION AND PHLEGM 09/16/16   Liberty Handy, PA-C  HYDROcodone-homatropine [facility]) 5-1.5 MG/5ML syrup Take 5 mLs by mouth every 6 (six) hours as needed for cough. FOR NIGHT TIME DISRUPTIVE COUGH AND BODY ACHES 09/16/16   Liberty Handy, PA-C  ibuprofen (ADVIL,MOTRIN) 600 MG tablet Take 1 tablet (  600 mg total) by mouth every 6 (six) hours as needed. 08/12/16   Bethel Born, PA-C  methocarbamol (ROBAXIN) 500 MG tablet Take 1 tablet (500 mg total) by mouth at bedtime and may repeat dose one time if needed. 08/12/16   Bethel Born, PA-C  ondansetron (ZOFRAN ODT) 4 MG disintegrating tablet Take 1 tablet (4 mg total) by mouth every 8 (eight) hours as needed for nausea or vomiting. 09/16/16   Liberty Handy, PA-C   Family History Family History  Problem Relation Age  of Onset  . Cancer Mother    Social History Social History  Substance Use Topics  . Smoking status: Current Every Day Smoker    Packs/day: 0.00    Types: Cigars  . Smokeless tobacco: Never Used  . Alcohol use No     Comment: occassionally   Allergies   Patient has no known allergies.  Review of Systems Review of Systems  Constitutional: Positive for chills and fever.  HENT: Positive for congestion, rhinorrhea and sore throat.   Eyes: Negative for visual disturbance.  Respiratory: Positive for cough. Negative for shortness of breath.   Cardiovascular: Negative for chest pain.  Gastrointestinal: Positive for nausea and vomiting. Negative for abdominal pain, constipation and diarrhea.  Genitourinary: Negative for difficulty urinating, dysuria, flank pain, frequency, pelvic pain, vaginal bleeding and vaginal discharge.  Musculoskeletal: Negative for arthralgias.  Neurological: Positive for weakness and headaches. Negative for dizziness and light-headedness.   Physical Exam Updated Vital Signs BP 104/61 (BP Location: Left Arm)   Pulse 105   Temp 100.9 F (38.3 C)   Resp 20   Ht 5\' 6"  (1.676 m)   Wt 55.8 kg   SpO2 100%   BMI 19.85 kg/m   Physical Exam  Constitutional: She is oriented to person, place, and time. She appears well-developed and well-nourished. No distress.  NAD.  HENT:  Head: Normocephalic and atraumatic.  Right Ear: External ear normal.  Left Ear: External ear normal.  Mouth/Throat: Posterior oropharyngeal erythema present. No oropharyngeal exudate.  Head: No lesions on scalp. Skull and facial bones symmetric, non-tender without bony abnormalities. Frontal and maxillary sinuses are non-tender to percussion. Eyes: Lids symmetrical without lag or palpable mass. Sclera white without prominent vessels. Conjunctiva pink. PERRL bilaterally.  Ears: Both external ears withut lesions, swelling, deformities or tenderness is mastoid areas. R and L external ear  auditory canals clear without edema or erythema.  TMs pearly gray with visible cone of light and bony landmarks bilaterally.  Nose: Nares patent without discharge. Nasal mucosa pink. Moderate nasal mucosa edema (L>R). No sinus tenderness. Septum midline.  Throat: Lips are pink and symmetrical. Dentition normal.  Gingiva, labial and buccal mucosa pink without lesions, tenderness or fluctuance. Oropharynx and tonsils moist without erythema, edema or exudates. Uvula midline. No trismus.   Eyes: Conjunctivae and EOM are normal. Pupils are equal, round, and reactive to light. No scleral icterus.  Neck: Normal range of motion. Neck supple. No JVD present.  Cardiovascular: Normal rate, regular rhythm and normal heart sounds.   No murmur heard. Pulmonary/Chest: Effort normal and breath sounds normal. She has no wheezes.  RR within normal limits. SpO2 within normal limits.  Normal breathing effort. Patient speaking in full sentences. No pursed lip breathing. Chest wall expansion symmetric.  No chest wall tenderness. Lungs CTAB anteriorly and posteriorly without wheezing, rhonchi or rales.  No egophony.   Abdominal: There is no CVA tenderness.  No surgical abdominal scars noted. No pulsating masses.  +  Bowel sounds throughout.  Abdomen is soft, non tender without distention, rigidity, guarding or rebound.  No suprapubic tenderness. No CVAT.  Negative Murphy's. Negative McBurney's. Negative Psoas sign.  Non palpable kidneys. No hepatosplenomegaly.   Musculoskeletal: Normal range of motion. She exhibits no deformity.  Lymphadenopathy:    She has no cervical adenopathy.  Neurological: She is alert and oriented to person, place, and time.  Skin: Skin is warm and dry. Capillary refill takes less than 2 seconds.  Psychiatric: She has a normal mood and affect. Her behavior is normal. Judgment and thought content normal.  Nursing note and vitals reviewed.   ED Treatments / Results  DIAGNOSTIC  STUDIES: Oxygen Saturation is 100% on RA, normal by my interpretation.   COORDINATION OF CARE: 9:27 AM-Discussed next steps with pt including antiemetics and symptomatic treatment. Pt verbalized understanding and is agreeable with the plan.   Labs (all labs ordered are listed, but only abnormal results are displayed) Labs Reviewed - No data to display  EKG  EKG Interpretation None      Radiology No results found.  Procedures Procedures  Medications Ordered in ED Medications  acetaminophen (TYLENOL) tablet 650 mg (650 mg Oral Not Given 09/16/16 0919)  acetaminophen (TYLENOL) tablet 650 mg (650 mg Oral Given 09/16/16 0917)  ondansetron (ZOFRAN-ODT) disintegrating tablet 4 mg (4 mg Oral Given 09/16/16 0933)    Initial Impression / Assessment and Plan / ED Course  I have reviewed the triage vital signs and the nursing notes.  Pertinent labs & imaging results that were available during my care of the patient were reviewed by me and considered in my medical decision making (see chart for details).     Patient with symptoms consistent with influenza.  Vitals are stable, low-grade fever.  On my exam patient is nontoxic appearing, speaking in full sentences. No fever, no tachypnea, no tachycardia, normal oxygen saturations. Lungs are clear to auscultation bilaterally without wheezing, rales, egophony. I do not think that a chest x-ray is indicated at this time as vital signs are within normal limits, there are no signs of consolidation on chest exam, there is no hypoxia. Due to patient's presentation and physical exam a chest x-ray was not ordered bc likely diagnosis of flu.   I doubt bacterial bronchitis, pneumonia, PE, ACS.  Discussed the cost versus benefit of Tamiflu treatment with the patient. The patient understands that she is not a high risk patient that would warrant tamiflut.  I think that symptoms can be treated conservatively at this point. Given reassuring physical exam and vital  signs within normal limits patient will be discharged with symptomatic treatment including rest, fluids, Flonase, Tessalon, Robitussin, Hycodan. Strict ED return precautions given. Patient is aware that a viral URI infection may precede the onset of bacterial bronchitis or pneumonia. Patient is aware of red flag symptoms to monitor for that would warrant return to the ED for further reevaluation. Pt ambulated with pulse ox within normal limits prior to discharge.   Final Clinical Impressions(s) / ED Diagnoses   Final diagnoses:  Influenza-like illness   New Prescriptions Discharge Medication List as of 09/16/2016  9:46 AM    START taking these medications   Details  benzonatate (TESSALON PERLES) 100 MG capsule Take 1 capsule (100 mg total) by mouth 3 (three) times daily as needed for cough. FOR DAY TIME DISRUPTIVE COUGH, Starting Thu 09/16/2016, Print    fluticasone (FLONASE) 50 MCG/ACT nasal spray Place 2 sprays into both nostrils daily., Starting Thu  09/16/2016, Print    guaiFENesin (ROBITUSSIN) 100 MG/5ML liquid Take 5-10 mLs (100-200 mg total) by mouth every 4 (four) hours as needed for cough. FOR CHEST CONGESTION AND PHLEGM, Starting Thu 09/16/2016, Print    HYDROcodone-homatropine (HYCODAN) 5-1.5 MG/5ML syrup Take 5 mLs by mouth every 6 (six) hours as needed for cough. FOR NIGHT TIME DISRUPTIVE COUGH AND BODY ACHES, Starting Thu 09/16/2016, Print    ondansetron (ZOFRAN ODT) 4 MG disintegrating tablet Take 1 tablet (4 mg total) by mouth every 8 (eight) hours as needed for nausea or vomiting., Starting Thu 09/16/2016, Print       I personally performed the services described in this documentation, which was scribed in my presence. The recorded information has been reviewed and is accurate.     Liberty Handy, PA-C 09/16/16 1015    Azalia Bilis, MD 09/16/16 [phone]

## 2016-09-23 ENCOUNTER — Ambulatory Visit: Payer: Medicaid Other | Admitting: Obstetrics & Gynecology

## 2016-10-11 DIAGNOSIS — R42 Dizziness and giddiness: Secondary | ICD-10-CM | POA: Diagnosis not present

## 2016-10-11 DIAGNOSIS — J069 Acute upper respiratory infection, unspecified: Secondary | ICD-10-CM | POA: Diagnosis not present

## 2016-11-04 ENCOUNTER — Encounter (HOSPITAL_COMMUNITY): Payer: Self-pay

## 2016-11-04 ENCOUNTER — Emergency Department (HOSPITAL_COMMUNITY)
Admission: EM | Admit: 2016-11-04 | Discharge: 2016-11-04 | Disposition: A | Discharge status: Home / Self Care | Payer: Medicaid Other | Attending: Emergency Medicine | Admitting: Emergency Medicine

## 2016-11-04 DIAGNOSIS — N760 Acute vaginitis: Secondary | ICD-10-CM | POA: Diagnosis not present

## 2016-11-04 DIAGNOSIS — R1030 Lower abdominal pain, unspecified: Secondary | ICD-10-CM | POA: Diagnosis present

## 2016-11-04 DIAGNOSIS — N3001 Acute cystitis with hematuria: Secondary | ICD-10-CM | POA: Insufficient documentation

## 2016-11-04 DIAGNOSIS — F17290 Nicotine dependence, other tobacco product, uncomplicated: Secondary | ICD-10-CM | POA: Insufficient documentation

## 2016-11-04 DIAGNOSIS — B9689 Other specified bacterial agents as the cause of diseases classified elsewhere: Secondary | ICD-10-CM | POA: Insufficient documentation

## 2016-11-04 DIAGNOSIS — Z79899 Other long term (current) drug therapy: Secondary | ICD-10-CM | POA: Insufficient documentation

## 2016-11-04 LAB — COMPREHENSIVE METABOLIC PANEL
ALK PHOS: 70 U/L (ref 38–126)
ALT: 10 U/L — AB (ref 14–54)
AST: 17 U/L (ref 15–41)
Albumin: 3.4 g/dL — ABNORMAL LOW (ref 3.5–5.0)
Anion gap: 10 (ref 5–15)
BUN: 12 mg/dL (ref 6–20)
CHLORIDE: 104 mmol/L (ref 101–111)
CO2: 23 mmol/L (ref 22–32)
CREATININE: 0.71 mg/dL (ref 0.44–1.00)
Calcium: 9 mg/dL (ref 8.9–10.3)
GFR calc Af Amer: >60 mL/min (ref >60)
Glucose, Bld: 117 mg/dL — ABNORMAL HIGH (ref 65–99)
Potassium: 3.7 mmol/L (ref 3.5–5.1)
Sodium: 137 mmol/L (ref 135–145)
Total Bilirubin: 0.6 mg/dL (ref 0.3–1.2)
Total Protein: 7.3 g/dL (ref 6.5–8.1)

## 2016-11-04 LAB — LIPASE, BLOOD: LIPASE: 19 U/L (ref 11–51)

## 2016-11-04 LAB — URINALYSIS, ROUTINE W REFLEX MICROSCOPIC
Bilirubin Urine: NEGATIVE
Glucose, UA: NEGATIVE mg/dL
Ketones, ur: NEGATIVE mg/dL
Nitrite: NEGATIVE
PROTEIN: 100 mg/dL — AB
Specific Gravity, Urine: 1.023 (ref 1.005–1.030)
pH: 6 (ref 5.0–8.0)

## 2016-11-04 LAB — CBC WITH DIFFERENTIAL/PLATELET
BASOS ABS: 0 10*3/uL (ref 0.0–0.1)
Basophils Relative: 1 %
Eosinophils Absolute: 0.1 10*3/uL (ref 0.0–0.7)
Eosinophils Relative: 1 %
HEMATOCRIT: 36.9 % (ref 36.0–46.0)
HEMOGLOBIN: 11.9 g/dL — AB (ref 12.0–15.0)
LYMPHS PCT: 23 %
Lymphs Abs: 1.8 10*3/uL (ref 0.7–4.0)
MCH: 27.5 pg (ref 26.0–34.0)
MCHC: 32.2 g/dL (ref 30.0–36.0)
MCV: 85.2 fL (ref 78.0–100.0)
Monocytes Absolute: 0.4 10*3/uL (ref 0.1–1.0)
Monocytes Relative: 5 %
NEUTROS PCT: 70 %
Neutro Abs: 5.7 10*3/uL (ref 1.7–7.7)
PLATELETS: 320 10*3/uL (ref 150–400)
RBC: 4.33 MIL/uL (ref 3.87–5.11)
RDW: 13.9 % (ref 11.5–15.5)
WBC: 8.1 10*3/uL (ref 4.0–10.5)

## 2016-11-04 LAB — I-STAT BETA HCG BLOOD, ED (MC, WL, AP ONLY): I-stat hCG, quantitative: <5 m[IU]/mL (ref <5)

## 2016-11-04 LAB — WET PREP, GENITAL
SPERM: NONE SEEN
TRICH WET PREP: NONE SEEN
Yeast Wet Prep HPF POC: NONE SEEN

## 2016-11-04 MED ORDER — CEPHALEXIN 500 MG PO CAPS: 500.0000 mg | ORAL_CAPSULE | Freq: Four times a day (QID) | ORAL | 0 refills | Status: DC

## 2016-11-04 MED ORDER — PHENAZOPYRIDINE HCL 200 MG PO TABS: 200.0000 mg | ORAL_TABLET | Freq: Three times a day (TID) | ORAL | 0 refills | Status: DC

## 2016-11-04 MED ORDER — PHENAZOPYRIDINE HCL 100 MG PO TABS
200.0000 mg | ORAL_TABLET | Freq: Once | ORAL | Status: AC
  Administered 2016-11-04: 200 mg via ORAL
  Filled 2016-11-04: qty 2

## 2016-11-04 MED ORDER — METRONIDAZOLE 500 MG PO TABS: 500.0000 mg | ORAL_TABLET | Freq: Two times a day (BID) | ORAL | 0 refills | Status: DC

## 2016-11-04 NOTE — ED Notes (Signed)
ED Provider at bedside. 

## 2016-11-04 NOTE — ED Provider Notes (Signed)
[facility] Provider Note   CSN: [id number] Arrival date & time: 11/04/16  0905     History   Chief Complaint Chief Complaint  Patient presents with  . Abdominal Pain    HPI Caryol Shane is a 21 y.o. female.  HPI   Patient is a 21 year old female with history of anemia who presents to the ED with complaint of lower abdominal pain, onset 3 days. Patient reports having intermittent mild sharp pain to her lower abdomen for the past few days with associated dysuria and hematuria. Denies any aggravating or alleviating factors. Denies radiation. She reports taking Tylenol at home without relief of symptoms. Denies fever, chills, cough, shortness of breath, chest pain, nausea, vomiting, diarrhea, flank pain, vaginal bleeding, vaginal discharge. Denies history of abdominal surgeries.  Past Medical History:  Diagnosis Date  . Anemia     Patient Active Problem List   Diagnosis Date Noted  . Leukocytosis   . Pyelonephritis   . Peripartum cardiomyopathy   . Aortic dissection (HCC)   . Back pain   . Acute respiratory failure with hypoxemia (HCC)   . Septic shock (HCC)   . Metabolic acidosis   . Acute pyelonephritis   . Severe sepsis (HCC)   . Bacteremia, escherichia coli   . Fever and chills   . Acute renal failure syndrome (HCC)   . Hypokalemia   . Hypomagnesemia   . Sinus tachycardia   . Endometritis complicating pregnancy   . Retained products of conception after delivery with complications   . Normocytic anemia   . Sepsis (HCC) 05/03/2015  . Postpartum endometritis 04/21/2015  . Retained products of conception with hemorrhage   . Nexplanon in place; placed postpartum in hospital on 04/16/15 04/16/2015  . NSVD (normal spontaneous vaginal delivery) on 04/15/2015 04/15/2015    Past Surgical History:  Procedure Laterality Date  . DILATION AND EVACUATION N/A 04/22/2015   Procedure: DILATATION AND EVACUATION;  Surgeon: Adam Phenix, MD;  Location: [facility];  Service:  Gynecology;  Laterality: N/A;    OB History    Gravida Para Term Preterm AB Living   1 1 1     1    SAB TAB Ectopic Multiple Live Births         0 1       Home Medications    Prior to Admission medications   Medication Sig Start Date End Date Taking? Authorizing Provider  benzonatate (TESSALON PERLES) 100 MG capsule Take 1 capsule (100 mg total) by mouth 3 (three) times daily as needed for cough. FOR DAY TIME DISRUPTIVE COUGH 09/16/16   Liberty Handy, PA-C  cephALEXin (KEFLEX) 500 MG capsule Take 1 capsule (500 mg total) by mouth 4 (four) times daily. 11/04/16   Barrett Henle, PA-C  fluticasone [facility]) 50 MCG/ACT nasal spray Place 2 sprays into both nostrils daily. 09/16/16   Liberty Handy, PA-C  guaiFENesin (ROBITUSSIN) 100 MG/5ML liquid Take 5-10 mLs (100-200 mg total) by mouth every 4 (four) hours as needed for cough. FOR CHEST CONGESTION AND PHLEGM 09/16/16   Liberty Handy, PA-C  HYDROcodone-homatropine [facility]) 5-1.5 MG/5ML syrup Take 5 mLs by mouth every 6 (six) hours as needed for cough. FOR NIGHT TIME DISRUPTIVE COUGH AND BODY ACHES 09/16/16   Liberty Handy, PA-C  ibuprofen (ADVIL,MOTRIN) 600 MG tablet Take 1 tablet (600 mg total) by mouth every 6 (six) hours as needed. 08/12/16   Bethel Born, PA-C  methocarbamol (ROBAXIN) 500 MG tablet Take 1  tablet (500 mg total) by mouth at bedtime and may repeat dose one time if needed. 08/12/16   Bethel Born, PA-C  metroNIDAZOLE (FLAGYL) 500 MG tablet Take 1 tablet (500 mg total) by mouth 2 (two) times daily. 11/04/16   Barrett Henle, PA-C  ondansetron (ZOFRAN ODT) 4 MG disintegrating tablet Take 1 tablet (4 mg total) by mouth every 8 (eight) hours as needed for nausea or vomiting. 09/16/16   Liberty Handy, PA-C  phenazopyridine (PYRIDIUM) 200 MG tablet Take 1 tablet (200 mg total) by mouth 3 (three) times daily. 11/04/16   Barrett Henle, PA-C    Family History Family History  Problem  Relation Age of Onset  . Cancer Mother     Social History Social History  Substance Use Topics  . Smoking status: Current Every Day Smoker    Packs/day: 0.00    Types: Cigars  . Smokeless tobacco: Never Used  . Alcohol use No     Comment: occassionally     Allergies   Patient has no known allergies.   Review of Systems Review of Systems  Gastrointestinal: Positive for abdominal pain.  Genitourinary: Positive for dysuria and hematuria.  All other systems reviewed and are negative.    Physical Exam Updated Vital Signs BP 96/60   Pulse 62   Temp 99.2 F (37.3 C) (Oral)   Resp 16   Ht 5\' 7"  (1.702 m)   Wt 52.6 kg   SpO2 99%   BMI 18.17 kg/m   Physical Exam  Constitutional: She is oriented to person, place, and time. She appears well-developed and well-nourished. No distress.  HENT:  Head: Normocephalic and atraumatic.  Mouth/Throat: Uvula is midline, oropharynx is clear and moist and mucous membranes are normal. No oropharyngeal exudate, posterior oropharyngeal edema, posterior oropharyngeal erythema or tonsillar abscesses. No tonsillar exudate.  Eyes: Conjunctivae and EOM are normal. Right eye exhibits no discharge. Left eye exhibits no discharge. No scleral icterus.  Neck: Normal range of motion. Neck supple.  Cardiovascular: Normal rate, regular rhythm, normal heart sounds and intact distal pulses.   Pulmonary/Chest: Effort normal and breath sounds normal. No respiratory distress. She has no wheezes. She has no rales. She exhibits no tenderness.  Abdominal: Soft. Bowel sounds are normal. She exhibits no distension and no mass. There is tenderness. There is no rebound and no guarding. No hernia.  Very mild TTP over suprapubic region. No CVA tenderness.  Musculoskeletal: Normal range of motion. She exhibits no edema.  Neurological: She is alert and oriented to person, place, and time.  Skin: Skin is warm and dry. She is not diaphoretic.  Nursing note and vitals  reviewed.  Pelvic exam: normal external genitalia, vulva, vagina, cervix, uterus and adnexa, VULVA: normal appearing vulva with no masses, tenderness or lesions, VAGINA: normal appearing vagina with normal color, no lesions, vaginal discharge - white, malodorous and mucoid, CERVIX: normal appearing cervix without discharge or lesions, UTERUS: uterus is normal size, shape, consistency and nontender, ADNEXA: normal adnexa in size, nontender and no masses, exam chaperoned by female tech.   ED Treatments / Results  Labs (all labs ordered are listed, but only abnormal results are displayed) Labs Reviewed  WET PREP, GENITAL - Abnormal; Notable for the following:       Result Value   Clue Cells Wet Prep HPF POC PRESENT (*)    WBC, Wet Prep HPF POC MANY (*)    All other components within normal limits  URINALYSIS, ROUTINE W REFLEX  MICROSCOPIC - Abnormal; Notable for the following:    APPearance TURBID (*)    Hgb urine dipstick MODERATE (*)    Protein, ur 100 (*)    Leukocytes, UA LARGE (*)    Bacteria, UA FEW (*)    Squamous Epithelial / LPF 0-5 (*)    All other components within normal limits  CBC WITH DIFFERENTIAL/PLATELET - Abnormal; Notable for the following:    Hemoglobin 11.9 (*)    All other components within normal limits  COMPREHENSIVE METABOLIC PANEL - Abnormal; Notable for the following:    Glucose, Bld 117 (*)    Albumin 3.4 (*)    ALT 10 (*)    All other components within normal limits  LIPASE, BLOOD  RPR  HIV ANTIBODY (ROUTINE TESTING)  I-STAT BETA HCG BLOOD, ED ([facility], [facility], [facility] ONLY)  GC/CHLAMYDIA PROBE AMP ([place]) NOT AT [facility]    EKG  EKG Interpretation None       Radiology No results found.  Procedures Procedures (including critical care time)  Medications Ordered in ED Medications  phenazopyridine (PYRIDIUM) tablet 200 mg (200 mg Oral Given 11/04/16 1143)     Initial Impression / Assessment and Plan / ED Course  I have reviewed the triage vital signs  and the nursing notes.  Pertinent labs & imaging results that were available during my care of the patient were reviewed by me and considered in my medical decision making (see chart for details).     Patient is a 21 year old female who presents the ED with complaint of lower abdominal pain, dysuria, hematuria. Denies fever or flank pain. Reports being sexually active with one partner, denies using condoms. Denies vaginal bleeding or discharge. VSS. Exam revealed mild tenderness over suprapubic region, no peritoneal signs. No CVA tenderness. Pelvic exam revealed small amount of malodorous discharge in vaginal vault, no CMT or adnexal tenderness. Pregnancy negative. Wet prep positive for clue cells and WBCs. UA consistent with UTI. Labs obtained and pending for HIV, syphilis, gonorrhea and Chlamydia. Suspect patient's symptoms are likely related to BV and UTI. Plan to discharge patient home with prescription of Keflex and Flagyl. Discussed results, pending labs and plan for discharge with patient. Patient given outpatient PCP follow-up. Discussed or return precautions.  Final Clinical Impressions(s) / ED Diagnoses   Final diagnoses:  Acute cystitis with hematuria  BV (bacterial vaginosis)    New Prescriptions New Prescriptions   CEPHALEXIN (KEFLEX) 500 MG CAPSULE    Take 1 capsule (500 mg total) by mouth 4 (four) times daily.   METRONIDAZOLE (FLAGYL) 500 MG TABLET    Take 1 tablet (500 mg total) by mouth 2 (two) times daily.   PHENAZOPYRIDINE (PYRIDIUM) 200 MG TABLET    Take 1 tablet (200 mg total) by mouth 3 (three) times daily.     Satira Sark [place], [place] 11/04/16 1308    Tilden Fossa, MD 11/05/16 [phone]

## 2016-11-04 NOTE — Discharge Instructions (Signed)
Take your antibiotic as prescribed until completed. Continue drinking fluids at home determine hydrated. Your results regarding your testing for STDs including gonorrhea, chlamydia, HIV and syphilis are pending and should be available in 2-3 days. I recommend following up with the women's clinic within the next week if her symptoms have not improved. Return to the emergency department if symptoms worsen or new onset of fever, chest pain, difficulty breathing, vomiting, abdominal pain.

## 2016-11-04 NOTE — ED Triage Notes (Signed)
Per Pt, Pt is coming from home with complaints of lower abdominal pain that started two days ago. Reports vaginal bleeding noted, but denies discharge. Burning with urination noted. Denies fevers, N/V

## 2016-11-05 LAB — RPR: RPR Ser Ql: NONREACTIVE

## 2016-11-05 LAB — GC/CHLAMYDIA PROBE AMP (~~LOC~~) NOT AT ARMC
CHLAMYDIA, DNA PROBE: NEGATIVE
NEISSERIA GONORRHEA: NEGATIVE

## 2016-11-05 LAB — HIV ANTIBODY (ROUTINE TESTING W REFLEX): HIV SCREEN 4TH GENERATION: NONREACTIVE

## 2016-11-24 ENCOUNTER — Ambulatory Visit: Payer: Medicaid Other | Admitting: Obstetrics & Gynecology

## 2017-03-10 ENCOUNTER — Ambulatory Visit: Payer: Medicaid Other | Admitting: Family Medicine

## 2017-05-20 ENCOUNTER — Inpatient Hospital Stay (HOSPITAL_COMMUNITY)
Admission: AD | Admit: 2017-05-20 | Discharge: 2017-05-20 | Discharge status: Against Medical Advice | Payer: Medicaid Other | Source: Ambulatory Visit | Attending: Obstetrics and Gynecology | Admitting: Obstetrics and Gynecology

## 2017-05-20 ENCOUNTER — Encounter (HOSPITAL_COMMUNITY): Payer: Self-pay | Admitting: *Deleted

## 2017-05-20 DIAGNOSIS — R109 Unspecified abdominal pain: Secondary | ICD-10-CM | POA: Insufficient documentation

## 2017-05-20 DIAGNOSIS — Z5321 Procedure and treatment not carried out due to patient leaving prior to being seen by health care provider: Secondary | ICD-10-CM | POA: Diagnosis not present

## 2017-05-20 LAB — URINALYSIS, ROUTINE W REFLEX MICROSCOPIC
Bilirubin Urine: NEGATIVE
GLUCOSE, UA: NEGATIVE mg/dL
Ketones, ur: NEGATIVE mg/dL
Leukocytes, UA: NEGATIVE
NITRITE: NEGATIVE
PH: 7 (ref 5.0–8.0)
Protein, ur: NEGATIVE mg/dL
SPECIFIC GRAVITY, URINE: 1.023 (ref 1.005–1.030)

## 2017-05-20 LAB — POCT PREGNANCY, URINE: PREG TEST UR: NEGATIVE

## 2017-05-20 NOTE — MAU Note (Signed)
Been vomiting, cramping and diarrhea(every 2 days for 2 wks) for 3 wks now.  Didn't get period last month.  Hasn't done a home test

## 2017-05-20 NOTE — MAU Note (Signed)
1858 not in lobby

## 2017-05-20 NOTE — MAU Note (Signed)
#  2 not in lobby 

## 2017-05-20 NOTE — MAU Note (Signed)
Pt not in lobby.  

## 2017-05-20 NOTE — MAU Note (Signed)
Noted on screen, pt walking out

## 2017-05-25 ENCOUNTER — Inpatient Hospital Stay (HOSPITAL_COMMUNITY): Payer: Medicaid Other

## 2017-05-25 ENCOUNTER — Encounter (HOSPITAL_COMMUNITY): Payer: Self-pay

## 2017-05-25 ENCOUNTER — Inpatient Hospital Stay (HOSPITAL_COMMUNITY)
Admission: AD | Admit: 2017-05-25 | Discharge: 2017-05-26 | Disposition: A | Discharge status: Home / Self Care | Payer: Medicaid Other | Source: Ambulatory Visit | Attending: Obstetrics & Gynecology | Admitting: Obstetrics & Gynecology

## 2017-05-25 ENCOUNTER — Emergency Department (HOSPITAL_COMMUNITY)
Admission: EM | Admit: 2017-05-25 | Discharge: 2017-05-25 | Disposition: A | Discharge status: Home / Self Care | Payer: No Typology Code available for payment source

## 2017-05-25 DIAGNOSIS — R1030 Lower abdominal pain, unspecified: Secondary | ICD-10-CM | POA: Diagnosis present

## 2017-05-25 DIAGNOSIS — B9689 Other specified bacterial agents as the cause of diseases classified elsewhere: Secondary | ICD-10-CM | POA: Diagnosis not present

## 2017-05-25 DIAGNOSIS — R102 Pelvic and perineal pain: Secondary | ICD-10-CM

## 2017-05-25 DIAGNOSIS — O23591 Infection of other part of genital tract in pregnancy, first trimester: Secondary | ICD-10-CM | POA: Insufficient documentation

## 2017-05-25 DIAGNOSIS — N760 Acute vaginitis: Secondary | ICD-10-CM

## 2017-05-25 DIAGNOSIS — Z79899 Other long term (current) drug therapy: Secondary | ICD-10-CM | POA: Diagnosis not present

## 2017-05-25 DIAGNOSIS — O26891 Other specified pregnancy related conditions, first trimester: Secondary | ICD-10-CM

## 2017-05-25 DIAGNOSIS — O26899 Other specified pregnancy related conditions, unspecified trimester: Secondary | ICD-10-CM

## 2017-05-25 DIAGNOSIS — Z3A08 8 weeks gestation of pregnancy: Secondary | ICD-10-CM | POA: Diagnosis not present

## 2017-05-25 DIAGNOSIS — R112 Nausea with vomiting, unspecified: Secondary | ICD-10-CM

## 2017-05-25 DIAGNOSIS — O219 Vomiting of pregnancy, unspecified: Secondary | ICD-10-CM | POA: Insufficient documentation

## 2017-05-25 LAB — URINALYSIS, ROUTINE W REFLEX MICROSCOPIC
Bilirubin Urine: NEGATIVE
GLUCOSE, UA: NEGATIVE mg/dL
HGB URINE DIPSTICK: NEGATIVE
KETONES UR: 20 mg/dL — AB
Leukocytes, UA: NEGATIVE
Nitrite: NEGATIVE
PROTEIN: NEGATIVE mg/dL
Specific Gravity, Urine: 1.026 (ref 1.005–1.030)
pH: 5 (ref 5.0–8.0)

## 2017-05-25 LAB — CBC
HCT: 34.1 % — ABNORMAL LOW (ref 36.0–46.0)
HEMOGLOBIN: 11.6 g/dL — AB (ref 12.0–15.0)
MCH: 28.5 pg (ref 26.0–34.0)
MCHC: 34 g/dL (ref 30.0–36.0)
MCV: 83.8 fL (ref 78.0–100.0)
Platelets: 238 10*3/uL (ref 150–400)
RBC: 4.07 MIL/uL (ref 3.87–5.11)
RDW: 12.9 % (ref 11.5–15.5)
WBC: 6.1 10*3/uL (ref 4.0–10.5)

## 2017-05-25 LAB — WET PREP, GENITAL
Sperm: NONE SEEN
Trich, Wet Prep: NONE SEEN
Yeast Wet Prep HPF POC: NONE SEEN

## 2017-05-25 LAB — POCT PREGNANCY, URINE: Preg Test, Ur: POSITIVE — AB

## 2017-05-25 NOTE — MAU Note (Addendum)
Pt presents to MAU with c/o of intermittent lower abdominal pain for 1 week and nausea/vomiting for 3 weeks. Pt denies vaginal bleeding/discharge. Pt LMP-03/22/2017

## 2017-05-25 NOTE — ED Notes (Signed)
Briahnna(NT) called pt x2 for triage, no answer.

## 2017-05-25 NOTE — ED Notes (Signed)
Called pt no answer °

## 2017-05-25 NOTE — ED Notes (Signed)
Called pt X3 for triage no answer 

## 2017-05-25 NOTE — MAU Provider Note (Signed)
Chief Complaint: Abdominal Pain   First Provider Initiated Contact with Patient 05/25/17 2251        SUBJECTIVE HPI: Deanna Sullivan is a 21 y.o. G1P1001 at Unknown by LMP who presents to maternity admissions reporting lower abdominal pain for a week. Has also had nausea and vomiting  . She denies vaginal bleeding, vaginal itching/burning, urinary symptoms, h/a, dizziness, n/v, or fever/chills.    Abdominal Pain  This is a new problem. The current episode started in the past 7 days. The onset quality is gradual. The problem occurs intermittently. The problem has been unchanged. The pain is located in the suprapubic region. The quality of the pain is cramping. The abdominal pain does not radiate. Associated symptoms include nausea and vomiting. Pertinent negatives include no constipation, diarrhea, fever or myalgias. Nothing aggravates the pain. The pain is relieved by nothing.    RN Note: Pt presents to MAU with c/o of intermittent lower abdominal pain for 1 week and nausea/vomiting for 3 weeks. Pt denies vaginal bleeding/discharge. Pt LMP-03/22/2017  Past Medical History:  Diagnosis Date  . Anemia    Past Surgical History:  Procedure Laterality Date  . DILATION AND EVACUATION N/A 04/22/2015   Procedure: DILATATION AND EVACUATION;  Surgeon: Adam Phenix, MD;  Location: [facility];  Service: Gynecology;  Laterality: N/A;   Social History   Social History  . Marital status: Single    Spouse name: N/A  . Number of children: N/A  . Years of education: N/A   Occupational History  . Not on file.   Social History Main Topics  . Smoking status: Never Smoker  . Smokeless tobacco: Never Used  . Alcohol use No     Comment: occassionally  . Drug use: No  . Sexual activity: Yes    Birth control/ protection: None   Other Topics Concern  . Not on file   Social History Narrative  . No narrative on file   No current facility-administered medications on file prior to encounter.     Current Outpatient Prescriptions on File Prior to Encounter  Medication Sig Dispense Refill  . ibuprofen (ADVIL,MOTRIN) 600 MG tablet Take 1 tablet (600 mg total) by mouth every 6 (six) hours as needed. 30 tablet 0  . benzonatate (TESSALON PERLES) 100 MG capsule Take 1 capsule (100 mg total) by mouth 3 (three) times daily as needed for cough. FOR DAY TIME DISRUPTIVE COUGH 20 capsule 0  . cephALEXin (KEFLEX) 500 MG capsule Take 1 capsule (500 mg total) by mouth 4 (four) times daily. 28 capsule 0  . fluticasone (FLONASE) 50 MCG/ACT nasal spray Place 2 sprays into both nostrils daily. 16 g 0  . guaiFENesin (ROBITUSSIN) 100 MG/5ML liquid Take 5-10 mLs (100-200 mg total) by mouth every 4 (four) hours as needed for cough. FOR CHEST CONGESTION AND PHLEGM 60 mL 0  . HYDROcodone-homatropine (HYCODAN) 5-1.5 MG/5ML syrup Take 5 mLs by mouth every 6 (six) hours as needed for cough. FOR NIGHT TIME DISRUPTIVE COUGH AND BODY ACHES 120 mL 0  . methocarbamol (ROBAXIN) 500 MG tablet Take 1 tablet (500 mg total) by mouth at bedtime and may repeat dose one time if needed. 10 tablet 0  . metroNIDAZOLE (FLAGYL) 500 MG tablet Take 1 tablet (500 mg total) by mouth 2 (two) times daily. 14 tablet 0  . ondansetron (ZOFRAN ODT) 4 MG disintegrating tablet Take 1 tablet (4 mg total) by mouth every 8 (eight) hours as needed for nausea or vomiting. 20 tablet 0  .  phenazopyridine (PYRIDIUM) 200 MG tablet Take 1 tablet (200 mg total) by mouth 3 (three) times daily. 6 tablet 0   No Known Allergies  I have reviewed patient's Past Medical Hx, Surgical Hx, Family Hx, Social Hx, medications and allergies.   ROS:  Review of Systems  Constitutional: Negative for fever.  Gastrointestinal: Positive for abdominal pain, nausea and vomiting. Negative for constipation and diarrhea.  Musculoskeletal: Negative for myalgias.   Review of Systems  Other systems negative   Physical Exam  Physical Exam Patient Vitals for the past 24  hrs:  BP Temp Temp src Pulse Resp  05/25/17 2237 112/62 98.8 F (37.1 C) Oral 88 18   Constitutional: Well-developed, well-nourished female in no acute distress.  Cardiovascular: normal rate Respiratory: normal effort GI: Abd soft, non-tender. Pos BS x 4 MS: Extremities nontender, no edema, normal ROM Neurologic: Alert and oriented x 4.  GU: Neg CVAT.  PELVIC EXAM: Cervix pink, visually closed, without lesion, scant white creamy discharge, vaginal walls and external genitalia normal Bimanual exam: Cervix 0/long/high, firm, anterior, neg CMT, uterus nontender, nonenlarged, adnexa without tenderness, enlargement, or mass   LAB RESULTS Results for orders placed or performed during the hospital encounter of 05/25/17 (from the past 24 hour(s))  Urinalysis, Routine w reflex microscopic     Status: Abnormal   Collection Time: 05/25/17 10:30 PM  Result Value Ref Range   Color, Urine YELLOW YELLOW   APPearance HAZY (A) CLEAR   Specific Gravity, Urine 1.026 1.005 - 1.030   pH 5.0 5.0 - 8.0   Glucose, UA NEGATIVE NEGATIVE mg/dL   Hgb urine dipstick NEGATIVE NEGATIVE   Bilirubin Urine NEGATIVE NEGATIVE   Ketones, ur 20 (A) NEGATIVE mg/dL   Protein, ur NEGATIVE NEGATIVE mg/dL   Nitrite NEGATIVE NEGATIVE   Leukocytes, UA NEGATIVE NEGATIVE  Pregnancy, urine POC     Status: Abnormal   Collection Time: 05/25/17 10:30 PM  Result Value Ref Range   Preg Test, Ur POSITIVE (A) NEGATIVE  Wet prep, genital     Status: Abnormal   Collection Time: 05/25/17 10:55 PM  Result Value Ref Range   Yeast Wet Prep HPF POC NONE SEEN NONE SEEN   Trich, Wet Prep NONE SEEN NONE SEEN   Clue Cells Wet Prep HPF POC PRESENT (A) NONE SEEN   WBC, Wet Prep HPF POC MODERATE (A) NONE SEEN   Sperm NONE SEEN   CBC     Status: Abnormal   Collection Time: 05/25/17 11:15 PM  Result Value Ref Range   WBC 6.1 4.0 - 10.5 K/uL   RBC 4.07 3.87 - 5.11 MIL/uL   Hemoglobin 11.6 (L) 12.0 - 15.0 g/dL   HCT 82.9 (L) 56.2 - 13.0  %   MCV 83.8 78.0 - 100.0 fL   MCH 28.5 26.0 - 34.0 pg   MCHC 34.0 30.0 - 36.0 g/dL   RDW 86.5 78.4 - 69.6 %   Platelets 238 150 - 400 K/uL  hCG, quantitative, pregnancy     Status: Abnormal   Collection Time: 05/25/17 11:15 PM  Result Value Ref Range   hCG, Beta Chain, Quant, S 305,116 (H) <5 mIU/mL       IMAGING US Ob Comp Less 14 Wks  Result Date: 05/26/2017 CLINICAL DATA:  Early pregnancy, lower abdominal pain for 1 week, pregnant; no quantitative beta HCG currently available for correlation EXAM: OBSTETRIC <14 WK ULTRASOUND TECHNIQUE: Transabdominal ultrasound was performed for evaluation of the gestation as well as the maternal uterus and  adnexal regions. Transvaginal imaging was not performed. COMPARISON:  None for this gestation FINDINGS: Intrauterine gestational sac: Present Yolk sac:  Present Embryo:  Present Cardiac Activity: Present Heart Rate: 171 bpm CRL:   18.0  mm   8 w 2 d                  [place] EDC: 01/02/2017 Subchorionic hemorrhage:  None visualized. Maternal uterus/adnexae: RIGHT ovary normal size and morphology, 1.8 x 2.8 x 1.7 cm. LEFT ovary measures 2.6 x 4.0 x 2.4 cm and contains a small corpus luteal cyst. No free pelvic fluid or adnexal masses. IMPRESSION: Single live intrauterine gestation at 8 weeks 2 days EGA by crown-rump length. No acute abnormalities. Electronically Signed   By: Ulyses Southward M.D.   On: 05/26/2017 00:01    MAU Management/MDM: Ordered usual first trimester r/o ectopic labs.   Pelvic exam and cultures done Will check baseline Ultrasound to rule out ectopic.  This bleeding/pain can represent a normal pregnancy with bleeding, spontaneous abortion or even an ectopic which can be life-threatening.  The process as listed above helps to determine which of these is present.  Wet prep showed BV, will treat Reassured to see fetal heart tones   ASSESSMENT Single IUP at [redacted]w[redacted]d Pelvic pain Nausea and vomiting Bacterial vaginosis  PLAN Discharge  home Pelvic rest until no pain Encouraged to seek [facility] Rx Phenergan for nausea Rx Flagyl for BV  Pt stable at time of discharge. Encouraged to return here or to other Urgent Care/ED if she develops worsening of symptoms, increase in pain, fever, or other concerning symptoms.    Wynelle Bourgeois CNM, MSN Certified Nurse-Midwife 05/25/2017  10:52 PM

## 2017-05-26 ENCOUNTER — Other Ambulatory Visit: Payer: Self-pay | Admitting: Advanced Practice Midwife

## 2017-05-26 DIAGNOSIS — R112 Nausea with vomiting, unspecified: Secondary | ICD-10-CM

## 2017-05-26 DIAGNOSIS — R109 Unspecified abdominal pain: Secondary | ICD-10-CM

## 2017-05-26 DIAGNOSIS — O9989 Other specified diseases and conditions complicating pregnancy, childbirth and the puerperium: Secondary | ICD-10-CM

## 2017-05-26 LAB — HCG, QUANTITATIVE, PREGNANCY: HCG, BETA CHAIN, QUANT, S: 305116 m[IU]/mL — AB (ref <5)

## 2017-05-26 LAB — HIV ANTIBODY (ROUTINE TESTING W REFLEX): HIV Screen 4th Generation wRfx: NONREACTIVE

## 2017-05-26 LAB — GC/CHLAMYDIA PROBE AMP (~~LOC~~) NOT AT ARMC
CHLAMYDIA, DNA PROBE: POSITIVE — AB
NEISSERIA GONORRHEA: NEGATIVE

## 2017-05-26 MED ORDER — PROMETHAZINE HCL 25 MG PO TABS: 25.0000 mg | ORAL_TABLET | Freq: Four times a day (QID) | ORAL | 2 refills | Status: DC | PRN

## 2017-05-26 NOTE — Discharge Instructions (Signed)
Morning Sickness °Morning sickness is when you feel sick to your stomach (nauseous) during pregnancy. This nauseous feeling may or may not come with vomiting. It often occurs in the morning but can be a problem any time of day. Morning sickness is most common during the first trimester, but it may continue throughout pregnancy. While morning sickness is unpleasant, it is usually harmless unless you develop severe and continual vomiting (hyperemesis gravidarum). This condition requires more intense treatment. °What are the causes? °The cause of morning sickness is not completely known but seems to be related to normal hormonal changes that occur in pregnancy. °What increases the risk? °You are at greater risk if you: °· Experienced nausea or vomiting before your pregnancy. °· Had morning sickness during a previous pregnancy. °· Are pregnant with more than one baby, such as twins. ° °How is this treated? °Do not use any medicines (prescription, over-the-counter, or herbal) for morning sickness without first talking to your health care provider. Your health care provider may prescribe or recommend: °· Vitamin B6 supplements. °· Anti-nausea medicines. °· The herbal medicine ginger. ° °Follow these instructions at home: °· Only take over-the-counter or prescription medicines as directed by your health care provider. °· Taking multivitamins before getting pregnant can prevent or decrease the severity of morning sickness in most women. °· Eat a piece of dry toast or unsalted crackers before getting out of bed in the morning. °· Eat five or six small meals a day. °· Eat dry and bland foods (rice, baked potato). Foods high in carbohydrates are often helpful. °· Do not drink liquids with your meals. Drink liquids between meals. °· Avoid greasy, fatty, and spicy foods. °· Get someone to cook for you if the smell of any food causes nausea and vomiting. °· If you feel nauseous after taking prenatal vitamins, take the vitamins at  night or with a snack. °· Snack on protein foods (nuts, yogurt, cheese) between meals if you are hungry. °· Eat unsweetened gelatins for desserts. °· Wearing an acupressure wristband (worn for sea sickness) may be helpful. °· Acupuncture may be helpful. °· Do not smoke. °· Get a humidifier to keep the air in your house free of odors. °· Get plenty of fresh air. °Contact a health care provider if: °· Your home remedies are not working, and you need medicine. °· You feel dizzy or lightheaded. °· You are losing weight. °Get help right away if: °· You have persistent and uncontrolled nausea and vomiting. °· You pass out (faint). °This information is not intended to replace advice given to you by your health care provider. Make sure you discuss any questions you have with your health care provider. °Document Released: 09/23/2006 Document Revised: 01/08/2016 Document Reviewed: 01/17/2013 °Elsevier Interactive Patient Education © 2017 [employer] °Bacterial Vaginosis °Bacterial vaginosis is a vaginal infection that occurs when the normal balance of bacteria in the vagina is disrupted. It results from an overgrowth of certain bacteria. This is the most common vaginal infection among women ages 15-44. °Because bacterial vaginosis increases your risk for STIs (sexually transmitted infections), getting treated can help reduce your risk for chlamydia, gonorrhea, herpes, and HIV (human immunodeficiency virus). Treatment is also important for preventing complications in pregnant women, because this condition can cause an early (premature) delivery. °What are the causes? °This condition is caused by an increase in harmful bacteria that are normally present in small amounts in the vagina. However, the reason that the condition develops is not fully understood. °What   increases the risk? °The following factors may make you more likely to develop this condition: °· Having a new sexual partner or multiple sexual partners. °· Having  unprotected sex. °· Douching. °· Having an intrauterine device (IUD). °· Smoking. °· Drug and alcohol abuse. °· Taking certain antibiotic medicines. °· Being pregnant. ° °You cannot get bacterial vaginosis from toilet seats, bedding, swimming pools, or contact with objects around you. °What are the signs or symptoms? °Symptoms of this condition include: °· Grey or white vaginal discharge. The discharge can also be watery or foamy. °· A fish-like odor with discharge, especially after sexual intercourse or during menstruation. °· Itching in and around the vagina. °· Burning or pain with urination. ° °Some women with bacterial vaginosis have no signs or symptoms. °How is this diagnosed? °This condition is diagnosed based on: °· Your medical history. °· A physical exam of the vagina. °· Testing a sample of vaginal fluid under a microscope to look for a large amount of bad bacteria or abnormal cells. Your health care provider may use a cotton swab or a small wooden spatula to collect the sample. ° °How is this treated? °This condition is treated with antibiotics. These may be given as a pill, a vaginal cream, or a medicine that is put into the vagina (suppository). If the condition comes back after treatment, a second round of antibiotics may be needed. °Follow these instructions at home: °Medicines °· Take over-the-counter and prescription medicines only as told by your health care provider. °· Take or use your antibiotic as told by your health care provider. Do not stop taking or using the antibiotic even if you start to feel better. °General instructions °· If you have a female sexual partner, tell her that you have a vaginal infection. She should see her health care provider and be treated if she has symptoms. If you have a female sexual partner, he does not need treatment. °· During treatment: °? Avoid sexual activity until you finish treatment. °? Do not douche. °? Avoid alcohol as directed by your health care  provider. °? Avoid breastfeeding as directed by your health care provider. °· Drink enough water and fluids to keep your urine clear or pale yellow. °· Keep the area around your vagina and rectum clean. °? Wash the area daily with warm water. °? Wipe yourself from front to back after using the toilet. °· Keep all follow-up visits as told by your health care provider. This is important. °How is this prevented? °· Do not douche. °· Wash the outside of your vagina with warm water only. °· Use protection when having sex. This includes latex condoms and dental dams. °· Limit how many sexual partners you have. To help prevent bacterial vaginosis, it is best to have sex with just one partner (monogamous). °· Make sure you and your sexual partner are tested for STIs. °· Wear cotton or cotton-lined underwear. °· Avoid wearing tight pants and pantyhose, especially during summer. °· Limit the amount of alcohol that you drink. °· Do not use any products that contain nicotine or tobacco, such as cigarettes and e-cigarettes. If you need help quitting, ask your health care provider. °· Do not use illegal drugs. °Where to find more information: °· Centers for Disease Control and Prevention: www.cdc.gov/std °· American Sexual Health Association (ASHA): www.ashastd.org °· U.S. Department of Health and Human Services, Office on Women's Health: www.womenshealth.gov/ or https://www.womenshealth.gov/a-z-topics/bacterial-vaginosis °Contact a health care provider if: °· Your symptoms do not improve, even after   treatment. °· You have more discharge or pain when urinating. °· You have a fever. °· You have pain in your abdomen. °· You have pain during sex. °· You have vaginal bleeding between periods. °Summary °· Bacterial vaginosis is a vaginal infection that occurs when the normal balance of bacteria in the vagina is disrupted. °· Because bacterial vaginosis increases your risk for STIs (sexually transmitted infections), getting treated can  help reduce your risk for chlamydia, gonorrhea, herpes, and HIV (human immunodeficiency virus). Treatment is also important for preventing complications in pregnant women, because the condition can cause an early (premature) delivery. °· This condition is treated with antibiotic medicines. These may be given as a pill, a vaginal cream, or a medicine that is put into the vagina (suppository). °This information is not intended to replace advice given to you by your health care provider. Make sure you discuss any questions you have with your health care provider. °Document Released: 08/02/2005 Document Revised: 04/17/2016 Document Reviewed: 04/17/2016 °Elsevier Interactive Patient Education © 2017 Elsevier Inc. ° °

## 2017-05-31 ENCOUNTER — Other Ambulatory Visit: Payer: Self-pay

## 2017-06-02 ENCOUNTER — Telehealth: Payer: Self-pay | Admitting: Student

## 2017-06-02 ENCOUNTER — Other Ambulatory Visit: Payer: Self-pay

## 2017-06-02 DIAGNOSIS — O3680X0 Pregnancy with inconclusive fetal viability, not applicable or unspecified: Secondary | ICD-10-CM

## 2017-06-02 DIAGNOSIS — A749 Chlamydial infection, unspecified: Secondary | ICD-10-CM

## 2017-06-02 MED ORDER — AZITHROMYCIN 500 MG PO TABS: 1000.0000 mg | ORAL_TABLET | Freq: Once | ORAL | 0 refills | Status: AC

## 2017-06-02 NOTE — Telephone Encounter (Addendum)
Deanna Sullivan tested positive for  Chlamydia. Patient was called by RN and allergies and pharmacy confirmed. Rx sent to pharmacy of choice.   Judeth Horn, NP 06/02/2017 8:17 PM       ----- Message from Kathe Becton, RN sent at 06/02/2017  4:13 PM EDT ----- This patient tested positive for: Chlaymdia  She :"has NKDA", I have informed the patient of her results and confirmed her pharmacy is correct in her chart. Please send Rx.   Thank you,   Kathe Becton, RN   Results faxed to [facility] Department.

## 2017-06-03 ENCOUNTER — Inpatient Hospital Stay (HOSPITAL_COMMUNITY)
Admission: AD | Admit: 2017-06-03 | Discharge: 2017-06-03 | Disposition: A | Discharge status: Home / Self Care | Payer: Medicaid Other | Source: Ambulatory Visit | Attending: Obstetrics and Gynecology | Admitting: Obstetrics and Gynecology

## 2017-06-03 ENCOUNTER — Encounter (HOSPITAL_COMMUNITY): Payer: Self-pay

## 2017-06-03 DIAGNOSIS — O9989 Other specified diseases and conditions complicating pregnancy, childbirth and the puerperium: Secondary | ICD-10-CM | POA: Insufficient documentation

## 2017-06-03 DIAGNOSIS — Z3A09 9 weeks gestation of pregnancy: Secondary | ICD-10-CM | POA: Insufficient documentation

## 2017-06-03 DIAGNOSIS — R197 Diarrhea, unspecified: Secondary | ICD-10-CM | POA: Diagnosis not present

## 2017-06-03 DIAGNOSIS — O210 Mild hyperemesis gravidarum: Secondary | ICD-10-CM | POA: Diagnosis not present

## 2017-06-03 DIAGNOSIS — O219 Vomiting of pregnancy, unspecified: Secondary | ICD-10-CM | POA: Diagnosis not present

## 2017-06-03 LAB — URINALYSIS, ROUTINE W REFLEX MICROSCOPIC
Bilirubin Urine: NEGATIVE
Glucose, UA: NEGATIVE mg/dL
HGB URINE DIPSTICK: NEGATIVE
Ketones, ur: NEGATIVE mg/dL
LEUKOCYTES UA: NEGATIVE
NITRITE: NEGATIVE
PROTEIN: NEGATIVE mg/dL
SPECIFIC GRAVITY, URINE: 1.011 (ref 1.005–1.030)
pH: 6 (ref 5.0–8.0)

## 2017-06-03 LAB — BETA HCG QUANT (REF LAB): HCG QUANT: 262854 m[IU]/mL

## 2017-06-03 MED ORDER — SODIUM CHLORIDE 0.9 % IV SOLN
8.0000 mg | Freq: Once | INTRAVENOUS | Status: AC
  Administered 2017-06-03: 8 mg via INTRAVENOUS
  Filled 2017-06-03: qty 4

## 2017-06-03 MED ORDER — LACTATED RINGERS IV BOLUS (SEPSIS)
1000.0000 mL | Freq: Once | INTRAVENOUS | Status: AC
  Administered 2017-06-03: 1000 mL via INTRAVENOUS

## 2017-06-03 MED ORDER — FAMOTIDINE IN NACL 20-0.9 MG/50ML-% IV SOLN
20.0000 mg | Freq: Once | INTRAVENOUS | Status: AC
  Administered 2017-06-03: 20 mg via INTRAVENOUS
  Filled 2017-06-03: qty 50

## 2017-06-03 NOTE — MAU Provider Note (Signed)
History     CSN: [id number]  Arrival date and time: 06/03/17 [id number]   First Provider Initiated Contact with Patient 06/03/17 [id number]      Chief Complaint  Patient presents with  . Nausea  . Diarrhea   HPI   Ms. Earmon Phoenixyeasia Starr is a 21 y.o. female G2P1001 @ 168w3d here in MAU with N, V, D. States symptoms started last night. She started having lower abdominal cramping, and then started having vomiting. States she has vomited 5 times in the last 24 hours. States she has had 2 episodes of liquid diarrhea. States she ate hot wings and salad and mozzarella sticks for dinner last night and then at 2:00 am she ate bacon and eggs and cheese biscuit.   OB History    Gravida Para Term Preterm AB Living   2 1 1     1    SAB TAB Ectopic Multiple Live Births         0 1      Past Medical History:  Diagnosis Date  . Anemia     Past Surgical History:  Procedure Laterality Date  . DILATION AND EVACUATION N/A 04/22/2015   Procedure: DILATATION AND EVACUATION;  Surgeon: Adam PhenixJames G Arnold, MD;  Location: [facility];  Service: Gynecology;  Laterality: N/A;    Family History  Problem Relation Age of Onset  . Cancer Mother     Social History  Substance Use Topics  . Smoking status: Never Smoker  . Smokeless tobacco: Never Used  . Alcohol use No     Comment: occassionally    Allergies: No Known Allergies  Prescriptions Prior to Admission  Medication Sig Dispense Refill Last Dose  . benzonatate (TESSALON PERLES) 100 MG capsule Take 1 capsule (100 mg total) by mouth 3 (three) times daily as needed for cough. FOR DAY TIME DISRUPTIVE COUGH 20 capsule 0 More than a month at Unknown time  . cephALEXin (KEFLEX) 500 MG capsule Take 1 capsule (500 mg total) by mouth 4 (four) times daily. 28 capsule 0 More than a month at Unknown time  . fluticasone (FLONASE) 50 MCG/ACT nasal spray Place 2 sprays into both nostrils daily. 16 g 0 More than a month at Unknown time  . guaiFENesin (ROBITUSSIN) 100 MG/5ML liquid  Take 5-10 mLs (100-200 mg total) by mouth every 4 (four) hours as needed for cough. FOR CHEST CONGESTION AND PHLEGM 60 mL 0 More than a month at Unknown time  . metroNIDAZOLE (FLAGYL) 500 MG tablet Take 1 tablet (500 mg total) by mouth 2 (two) times daily. 14 tablet 0 More than a month at Unknown time  . ondansetron (ZOFRAN ODT) 4 MG disintegrating tablet Take 1 tablet (4 mg total) by mouth every 8 (eight) hours as needed for nausea or vomiting. 20 tablet 0 More than a month at Unknown time  . phenazopyridine (PYRIDIUM) 200 MG tablet Take 1 tablet (200 mg total) by mouth 3 (three) times daily. 6 tablet 0 More than a month at Unknown time  . promethazine (PHENERGAN) 25 MG tablet Take 1 tablet (25 mg total) by mouth every 6 (six) hours as needed for nausea or vomiting. 30 tablet 2    Results for orders placed or performed during the hospital encounter of 06/03/17 (from the past 48 hour(s))  Urinalysis, Routine w reflex microscopic     Status: None   Collection Time: 06/03/17  7:50 AM  Result Value Ref Range   Color, Urine YELLOW YELLOW   APPearance CLEAR CLEAR  Specific Gravity, Urine 1.011 1.005 - 1.030   pH 6.0 5.0 - 8.0   Glucose, UA NEGATIVE NEGATIVE mg/dL   Hgb urine dipstick NEGATIVE NEGATIVE   Bilirubin Urine NEGATIVE NEGATIVE   Ketones, ur NEGATIVE NEGATIVE mg/dL   Protein, ur NEGATIVE NEGATIVE mg/dL   Nitrite NEGATIVE NEGATIVE   Leukocytes, UA NEGATIVE NEGATIVE   Review of Systems  Constitutional: Negative for fever.  Gastrointestinal: Positive for abdominal pain, diarrhea, nausea and vomiting.  Genitourinary: Negative for vaginal bleeding.   Physical Exam   Blood pressure 122/71, pulse 82, temperature 98.2 F (36.8 C), resp. rate 18, height 5\' 5"  (1.651 m), weight 51.7 kg (114 lb 0.6 oz), last menstrual period 03/28/2017, currently breastfeeding.  Physical Exam  Constitutional: She is oriented to person, place, and time. She appears well-developed and well-nourished. No  distress.  HENT:  Head: Normocephalic.  Eyes: Pupils are equal, round, and reactive to light.  GI: Soft. She exhibits no distension. There is no tenderness. There is no rebound and no guarding.  Musculoskeletal: Normal range of motion.  Neurological: She is alert and oriented to person, place, and time.  Skin: Skin is warm. She is not diaphoretic.  Psychiatric: Her behavior is normal.    MAU Course  Procedures  None  MDM  LR bolus Zofran 8 mg IV Pepcid 20 mg IV  Assessment and Plan   A:  1. Nausea and vomiting in pregnancy prior to [redacted] weeks gestation   2. Acute diarrhea     P:  Discharge home with strict return precautions  Increase Oral fluids Continue phenergan as prescribed BRAT diet recommended Avoid fried, fast foods  Rasch, Harolyn Rutherford, NP 06/03/2017 3:15 PM

## 2017-06-03 NOTE — MAU Note (Signed)
Onset of diarrhea about 4 hours ago, has had 2 episodes, has had N/V in pregnancy was taking Promethazine which doesn't help. Pain in lower abdominal area.

## 2017-06-03 NOTE — Discharge Instructions (Signed)

## 2017-07-04 ENCOUNTER — Encounter: Payer: Medicaid Other | Admitting: Obstetrics and Gynecology

## 2017-07-04 ENCOUNTER — Encounter: Payer: Self-pay | Admitting: Obstetrics and Gynecology

## 2017-07-04 NOTE — Progress Notes (Signed)
Patient did not show for her new OB appointment. She will be called to reschedule.  

## 2017-08-02 ENCOUNTER — Other Ambulatory Visit (HOSPITAL_COMMUNITY)
Admission: RE | Admit: 2017-08-02 | Discharge: 2017-08-02 | Disposition: A | Discharge status: Home / Self Care | Payer: Medicaid Other | Source: Ambulatory Visit | Attending: Advanced Practice Midwife | Admitting: Advanced Practice Midwife

## 2017-08-02 ENCOUNTER — Ambulatory Visit (INDEPENDENT_AMBULATORY_CARE_PROVIDER_SITE_OTHER): Payer: Medicaid Other | Admitting: Advanced Practice Midwife

## 2017-08-02 ENCOUNTER — Encounter: Payer: Self-pay | Admitting: Advanced Practice Midwife

## 2017-08-02 VITALS — BP 99/64 | HR 102 | Wt 115.5 lb

## 2017-08-02 DIAGNOSIS — N760 Acute vaginitis: Secondary | ICD-10-CM | POA: Diagnosis not present

## 2017-08-02 DIAGNOSIS — B9689 Other specified bacterial agents as the cause of diseases classified elsewhere: Secondary | ICD-10-CM | POA: Insufficient documentation

## 2017-08-02 DIAGNOSIS — Z3482 Encounter for supervision of other normal pregnancy, second trimester: Secondary | ICD-10-CM

## 2017-08-02 DIAGNOSIS — A749 Chlamydial infection, unspecified: Secondary | ICD-10-CM | POA: Insufficient documentation

## 2017-08-02 DIAGNOSIS — O98812 Other maternal infectious and parasitic diseases complicating pregnancy, second trimester: Secondary | ICD-10-CM

## 2017-08-02 DIAGNOSIS — O09299 Supervision of pregnancy with other poor reproductive or obstetric history, unspecified trimester: Secondary | ICD-10-CM | POA: Insufficient documentation

## 2017-08-02 DIAGNOSIS — Z23 Encounter for immunization: Secondary | ICD-10-CM

## 2017-08-02 DIAGNOSIS — O98819 Other maternal infectious and parasitic diseases complicating pregnancy, unspecified trimester: Secondary | ICD-10-CM | POA: Diagnosis not present

## 2017-08-02 DIAGNOSIS — Z3480 Encounter for supervision of other normal pregnancy, unspecified trimester: Secondary | ICD-10-CM

## 2017-08-02 DIAGNOSIS — O09292 Supervision of pregnancy with other poor reproductive or obstetric history, second trimester: Secondary | ICD-10-CM

## 2017-08-02 LAB — POCT URINALYSIS DIP (DEVICE)
Bilirubin Urine: NEGATIVE
Glucose, UA: NEGATIVE mg/dL
HGB URINE DIPSTICK: NEGATIVE
Ketones, ur: NEGATIVE mg/dL
LEUKOCYTES UA: NEGATIVE
NITRITE: NEGATIVE
Protein, ur: NEGATIVE mg/dL
Specific Gravity, Urine: >=1.03 (ref 1.005–1.030)
UROBILINOGEN UA: 2 mg/dL — AB (ref 0.0–1.0)
pH: 6.5 (ref 5.0–8.0)

## 2017-08-02 NOTE — Progress Notes (Signed)
   PRENATAL VISIT NOTE  Subjective:  Deanna Sullivan is a 21 y.o. G2P1001 at [redacted]w[redacted]d being seen today for initial prenatal visit.  She is currently monitored for the following issues for this low-risk pregnancy and has Supervision of other normal pregnancy, antepartum; Postpartum endometritis; Retained products of conception with hemorrhage; Sepsis (HCC); Acute respiratory failure with hypoxemia (HCC); Septic shock (HCC); Metabolic acidosis; Acute pyelonephritis; Severe sepsis (HCC); Bacteremia, escherichia coli; Fever and chills; Acute renal failure syndrome (HCC); Hypokalemia; Hypomagnesemia; Sinus tachycardia; Endometritis complicating pregnancy; Retained products of conception after delivery with complications; Normocytic anemia; Aortic dissection (HCC); Back pain; Peripartum cardiomyopathy; Leukocytosis; Pyelonephritis; History of postpartum hemorrhage, currently pregnant; and Chlamydia infection, current pregnancy on their problem list.  Patient reports no complaints.  Contractions: Not present. Vag. Bleeding: None.  Movement: Present. Denies leaking of fluid.   The following portions of the patient's history were reviewed and updated as appropriate: allergies, current medications, past family history, past medical history, past social history, past surgical history and problem list. Problem list updated.  Objective:   Vitals:   08/02/17 0814  BP: 99/64  Pulse: (!) 102  Weight: 115 lb 8 oz (52.4 kg)    Fetal Status: Fetal Heart Rate (bpm): 158   Movement: Present     General:  Alert, oriented and cooperative. Patient is in no acute distress.  Skin: Skin is warm and dry. No rash noted.   Cardiovascular: Normal heart rate noted  Respiratory: Normal respiratory effort, no problems with respiration noted  Abdomen: Soft, gravid, appropriate for gestational age.  Pain/Pressure: Present     Pelvic: Cervical exam performed Dilation: Closed Effacement (%): 100 Station: Ballotable  Extremities:  Normal range of motion.     Mental Status:  Normal mood and affect. Normal behavior. Normal judgment and thought content.   Assessment and Plan:  Pregnancy: G2P1001 at [redacted]w[redacted]d  1. Encounter for supervision of other normal pregnancy in second trimester --Reviewed second trimester expectations/risks/reasons to seek care --Offered genetic screening, reviewed reasons to do testing/not do testing. Pt elects to have Quad/AFP today. --Pt with hx of PP bleed/infection/retained placenta so Detail OB US ordered - Flu Vaccine QUAD 36+ mos IM (Fluarix, Quad PF) - Enroll Patient in Babyscripts - [place] MFM OB DETAIL +14 WK; Future - GC/Chlamydia probe amp ([place])not at [facility] - Culture, OB Urine - SMN1 Copy Number Analysis - Obstetric Panel, Including HIV - Hemoglobinopathy Evaluation - AFP TETRA  2. History of postpartum hemorrhage, currently pregnant --Pt with hemorrhage requiring transfusion, then retained placenta and acute pyelonephritis with infection and sepsis, ICU care on readmission in 04/2015 --No residual health problems following sepsis  3. Chlamydia infection, current pregnancy --TOC done today  Preterm labor symptoms and general obstetric precautions including but not limited to vaginal bleeding, contractions, leaking of fluid and fetal movement were reviewed in detail with the patient. Please refer to After Visit Summary for other counseling recommendations.  No Follow-up on file.   Sharen Counter, CNM

## 2017-08-03 LAB — CERVICOVAGINAL ANCILLARY ONLY
Bacterial vaginitis: POSITIVE — AB
Candida vaginitis: NEGATIVE
Chlamydia: POSITIVE — AB
NEISSERIA GONORRHEA: NEGATIVE
TRICH (WINDOWPATH): NEGATIVE

## 2017-08-04 ENCOUNTER — Telehealth: Payer: Self-pay

## 2017-08-04 DIAGNOSIS — N760 Acute vaginitis: Secondary | ICD-10-CM

## 2017-08-04 DIAGNOSIS — B9689 Other specified bacterial agents as the cause of diseases classified elsewhere: Secondary | ICD-10-CM

## 2017-08-04 DIAGNOSIS — A749 Chlamydial infection, unspecified: Secondary | ICD-10-CM

## 2017-08-04 LAB — CULTURE, OB URINE

## 2017-08-04 LAB — URINE CULTURE, OB REFLEX

## 2017-08-04 MED ORDER — AZITHROMYCIN 250 MG PO TABS: 1000.0000 mg | ORAL_TABLET | Freq: Once | ORAL | 0 refills | Status: AC

## 2017-08-04 MED ORDER — METRONIDAZOLE 500 MG PO TABS: 500.0000 mg | ORAL_TABLET | Freq: Two times a day (BID) | ORAL | 0 refills | Status: DC

## 2017-08-04 NOTE — Telephone Encounter (Signed)
Called patient to inform her of her positive Chlamydia and BV results. Got vm. Left message for patient to call the office. Medication was sent to pharmacy. STD report was faxed to the HD.

## 2017-08-05 LAB — AFP TETRA
DIA MOM VALUE: 1.41
DIA Value (EIA): 283.52 pg/mL
DSR (By Age)    1 IN: 1133
DSR (Second Trimester) 1 IN: 10000
GESTATIONAL AGE AFP: 18 wk
MATERNAL AGE AT EDD: 21.8 a
MSAFP Mom: 0.97
MSAFP: 54.1 ng/mL
MSHCG Mom: 0.64
MSHCG: 21024 m[IU]/mL
OSB RISK: 10000
T18 (By Age): 1:4412 {titer}
Test Results:: NEGATIVE
UE3 MOM: 1.12
UE3 VALUE: 1.61 ng/mL
Weight: 115 [lb_av]

## 2017-08-05 LAB — HEMOGLOBINOPATHY EVALUATION
FERRITIN: 30 ng/mL (ref 15–150)
HGB C: 0 %
HGB S: 0 %
HGB VARIANT: 0 %
Hgb A2 Quant: 2.1 % (ref 1.8–3.2)
Hgb A: 96.3 % — ABNORMAL LOW (ref 96.4–98.8)
Hgb F Quant: 1.6 % (ref 0.0–2.0)
Hgb Solubility: NEGATIVE

## 2017-08-05 LAB — OBSTETRIC PANEL, INCLUDING HIV
ANTIBODY SCREEN: NEGATIVE
BASOS: 1 %
Basophils Absolute: 0 10*3/uL (ref 0.0–0.2)
EOS (ABSOLUTE): 0.1 10*3/uL (ref 0.0–0.4)
Eos: 1 %
HEMATOCRIT: 33.2 % — AB (ref 34.0–46.6)
HIV SCREEN 4TH GENERATION: NONREACTIVE
Hemoglobin: 11.2 g/dL (ref 11.1–15.9)
Hepatitis B Surface Ag: NEGATIVE
Immature Grans (Abs): 0 10*3/uL (ref 0.0–0.1)
Immature Granulocytes: 0 %
LYMPHS ABS: 1.3 10*3/uL (ref 0.7–3.1)
Lymphs: 23 %
MCH: 29.1 pg (ref 26.6–33.0)
MCHC: 33.7 g/dL (ref 31.5–35.7)
MCV: 86 fL (ref 79–97)
MONOCYTES: 5 %
Monocytes Absolute: 0.3 10*3/uL (ref 0.1–0.9)
NEUTROS ABS: 4.2 10*3/uL (ref 1.4–7.0)
Neutrophils: 70 %
PLATELETS: 273 10*3/uL (ref 150–379)
RBC: 3.85 x10E6/uL (ref 3.77–5.28)
RDW: 14.9 % (ref 12.3–15.4)
RPR Ser Ql: NONREACTIVE
RUBELLA: 3.68 {index} (ref >0.99)
Rh Factor: POSITIVE
WBC: 6 10*3/uL (ref 3.4–10.8)

## 2017-08-05 LAB — CYTOLOGY - PAP: Diagnosis: NEGATIVE

## 2017-08-11 LAB — SMN1 COPY NUMBER ANALYSIS (SMA CARRIER SCREENING)

## 2017-08-15 ENCOUNTER — Ambulatory Visit (HOSPITAL_COMMUNITY)
Admission: RE | Admit: 2017-08-15 | Discharge: 2017-08-15 | Disposition: A | Discharge status: Home / Self Care | Payer: Medicaid Other | Source: Ambulatory Visit | Attending: Advanced Practice Midwife | Admitting: Advanced Practice Midwife

## 2017-08-15 ENCOUNTER — Other Ambulatory Visit: Payer: Self-pay | Admitting: Advanced Practice Midwife

## 2017-08-15 DIAGNOSIS — Z3482 Encounter for supervision of other normal pregnancy, second trimester: Secondary | ICD-10-CM | POA: Diagnosis not present

## 2017-08-15 DIAGNOSIS — Z3A20 20 weeks gestation of pregnancy: Secondary | ICD-10-CM

## 2017-08-15 DIAGNOSIS — Z3689 Encounter for other specified antenatal screening: Secondary | ICD-10-CM | POA: Diagnosis present

## 2017-08-15 DIAGNOSIS — O2692 Pregnancy related conditions, unspecified, second trimester: Secondary | ICD-10-CM

## 2017-08-16 NOTE — L&D Delivery Note (Signed)
Patient is a 22 y.o. now G2P2 s/p NSVD at [redacted]w[redacted]d, who was admitted for SOL.  She progressed without augmentation to complete with AROM at complete dilation and pushed to deliver. Dr Despina Hidden called to [facility] due to bradycardia at delivery. Cord clamping delayed then clamped by CNM and cut by FOB.  Placenta intact and spontaneous, bleeding minimal.  Right labial laceration identified, not repaired- hemostatic.  Mom and baby stable prior to transfer to postpartum. She plans on Formula feeding. She requests Nexplanon for birth control.  Delivery Note At 7:35 AM a viable and healthy female was delivered via Vaginal, Spontaneous (Presentation:LOA).  APGAR: 7, 9; weight pending .   Placenta intact and spontaneous, bleeding minimal. 3VCord:  with the following complications: bradycardia at delivery .  Cord pH: pending  Anesthesia: None Episiotomy: None Lacerations: Labial Suture Repair: None Est. Blood Loss (mL): 150  Mom to postpartum.  Baby to Couplet care / Skin to Skin.  Sharyon Cable CNM 01/02/2018, 8:09 AM

## 2017-08-18 ENCOUNTER — Encounter: Payer: Self-pay | Admitting: General Practice

## 2017-08-18 ENCOUNTER — Other Ambulatory Visit: Payer: Self-pay | Admitting: Advanced Practice Midwife

## 2017-08-18 DIAGNOSIS — Z3482 Encounter for supervision of other normal pregnancy, second trimester: Secondary | ICD-10-CM

## 2017-08-18 DIAGNOSIS — Z3A20 20 weeks gestation of pregnancy: Secondary | ICD-10-CM

## 2017-08-18 DIAGNOSIS — O2692 Pregnancy related conditions, unspecified, second trimester: Secondary | ICD-10-CM

## 2017-08-18 NOTE — Telephone Encounter (Signed)
Called patient several times but phone line is busy every time. Will send mychart message.

## 2017-08-22 IMAGING — US US PELVIS COMPLETE
1 series · 14 of 25 positions shown · non-contrast
Comparison: CT 05/06/2015.

CLINICAL DATA: Retained products of conception.  Septic shock.

EXAM:
TRANSABDOMINAL AND TRANSVAGINAL ULTRASOUND OF PELVIS
TECHNIQUE: Both transabdominal and transvaginal ultrasound examinations of the
pelvis were performed. Transabdominal technique was performed for
global imaging of the pelvis including uterus, ovaries, adnexal
regions, and pelvic cul-de-sac. It was necessary to proceed with
endovaginal exam following the transabdominal exam to visualize the
uterus and ovaries.

[Series 1: us pelvis complete · 0.24mm/px · 51 acquisitions, 14 frames shown]
[im 1/51]
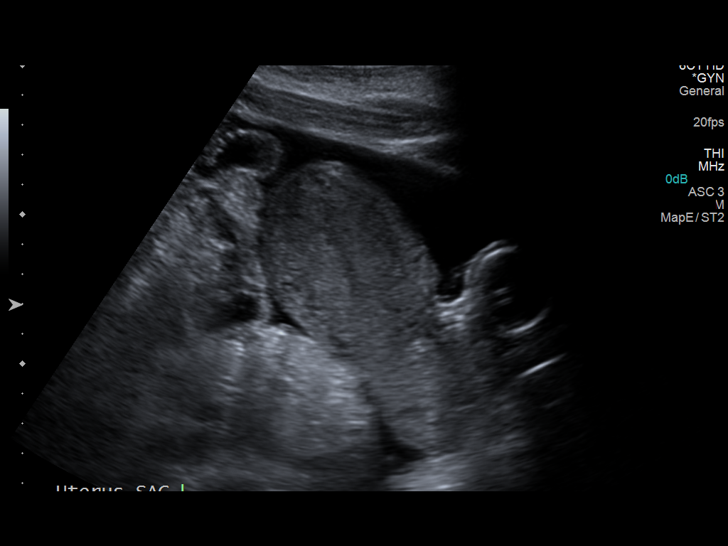
[im 5/51]
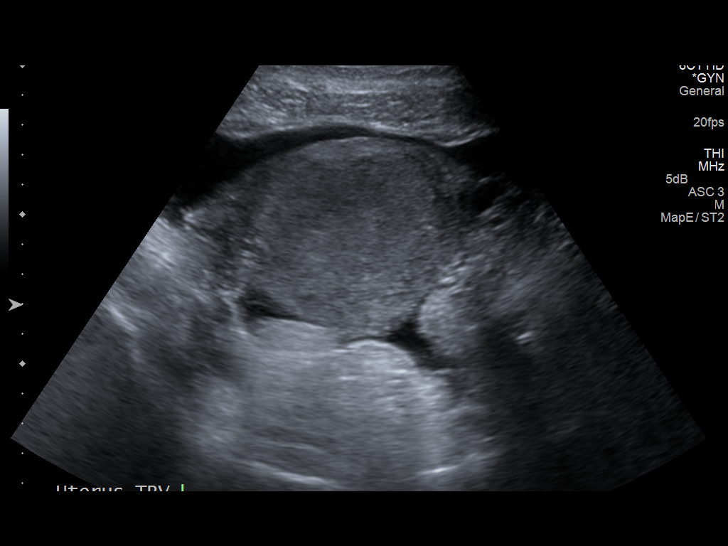
[im 9/51]
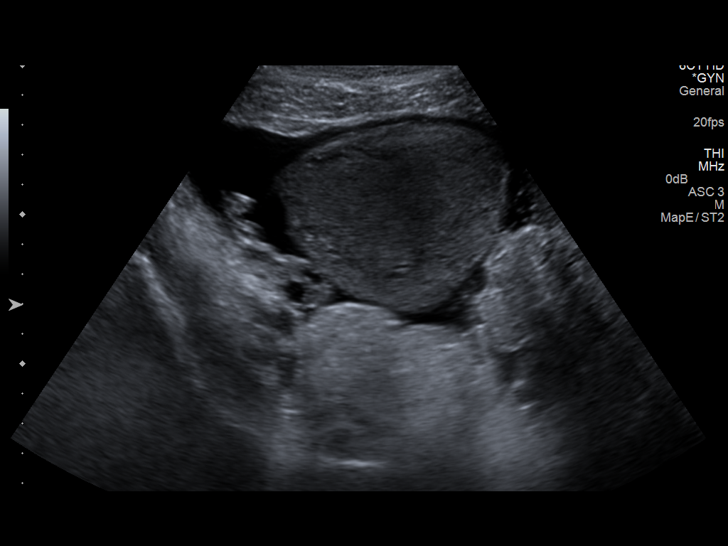
[im 13/51]
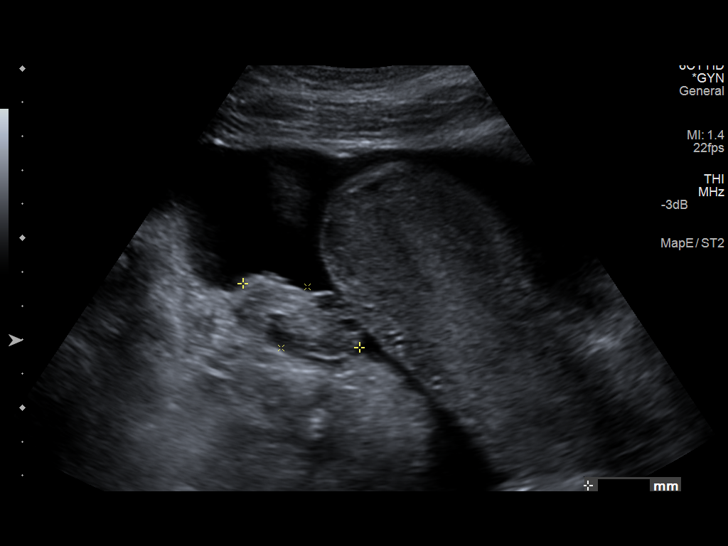
[im 17/51]
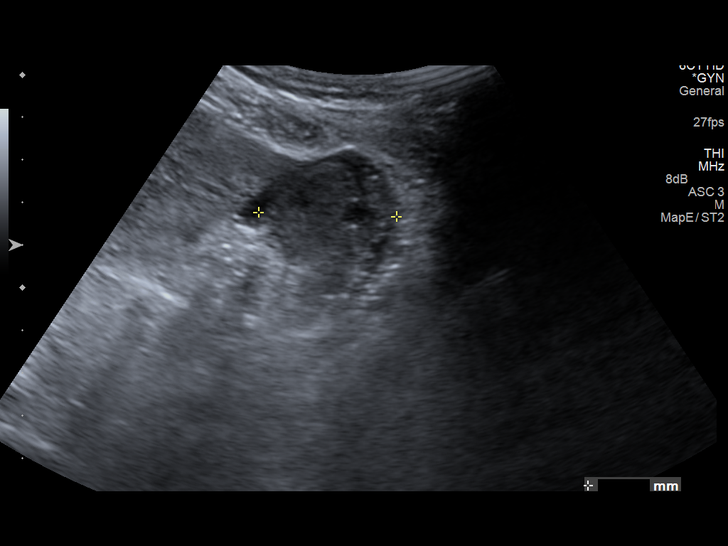
[im 19/51]
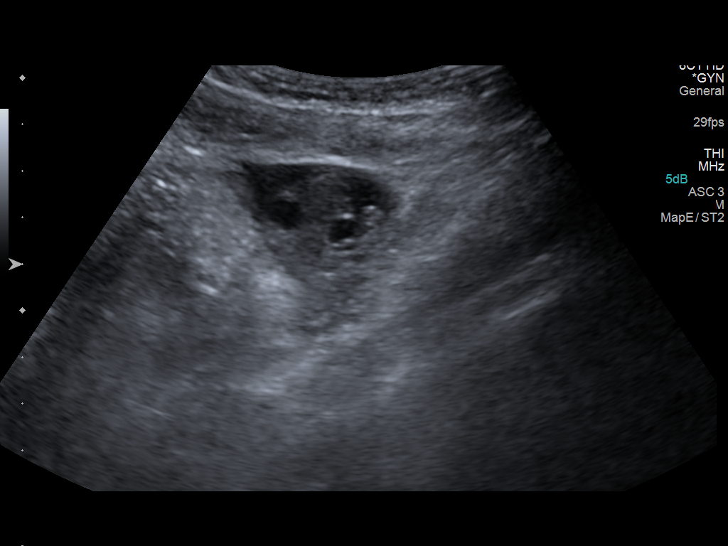
[im 23/51]
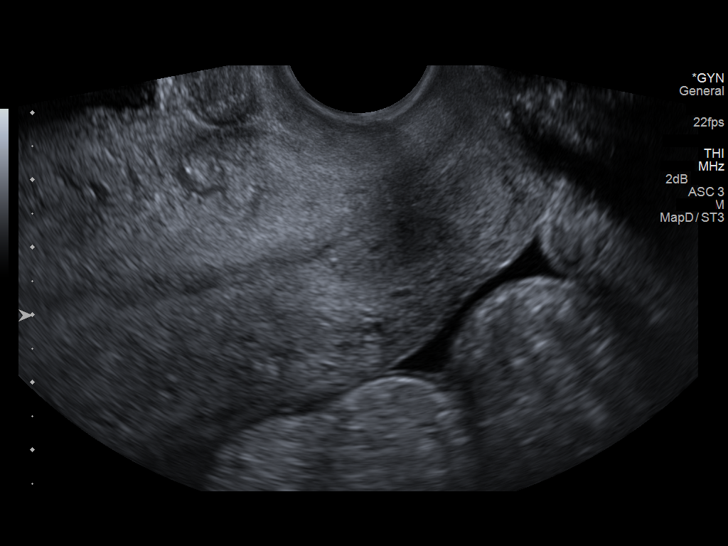
[im 28/51]
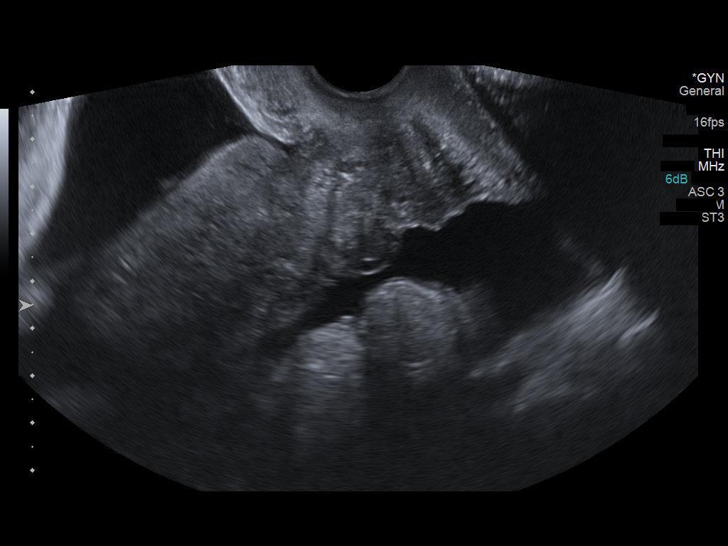
[im 32/51]
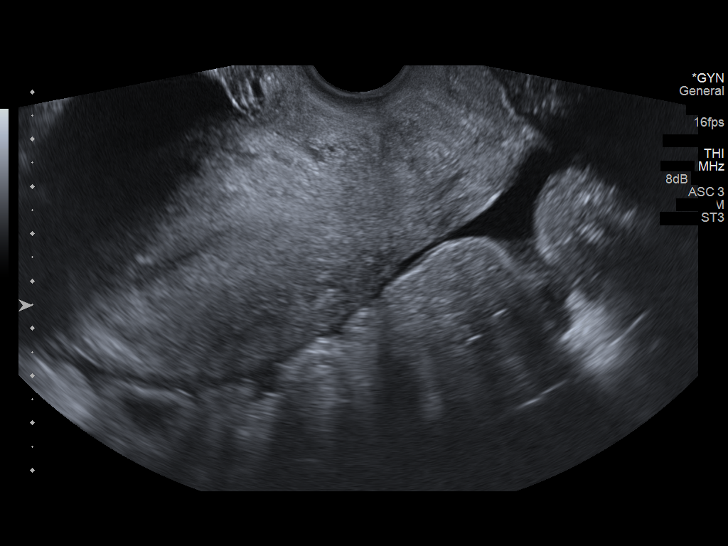
[im 34/51]
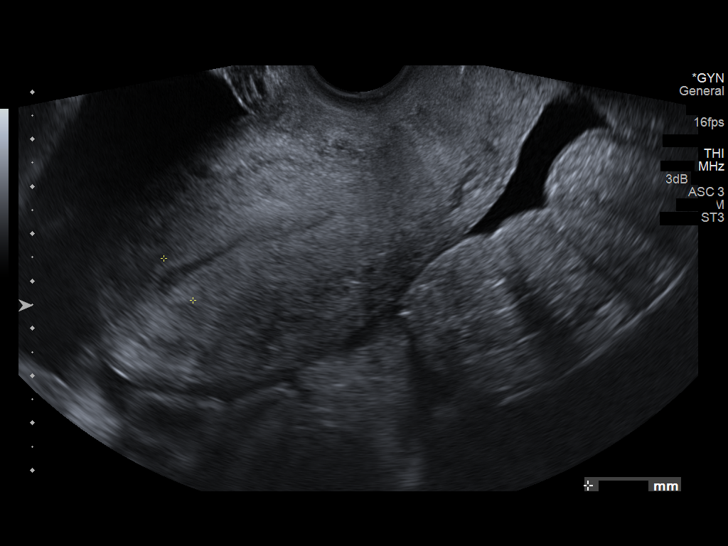
[im 38/51]
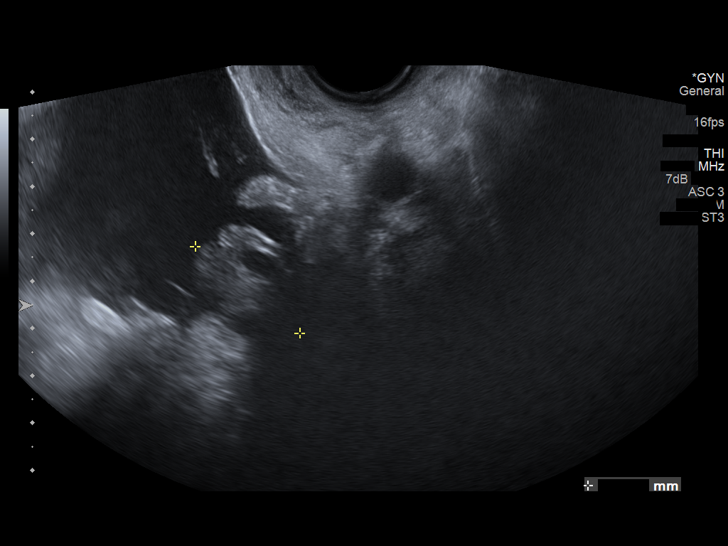
[im 42/51]
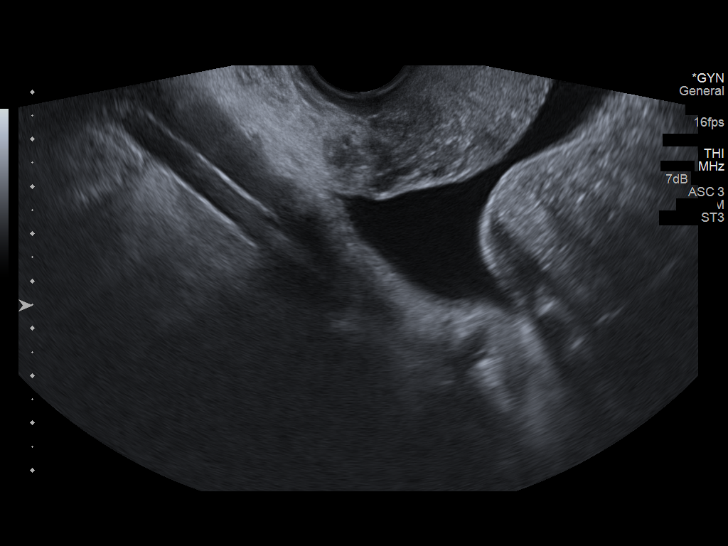
[im 46/51]
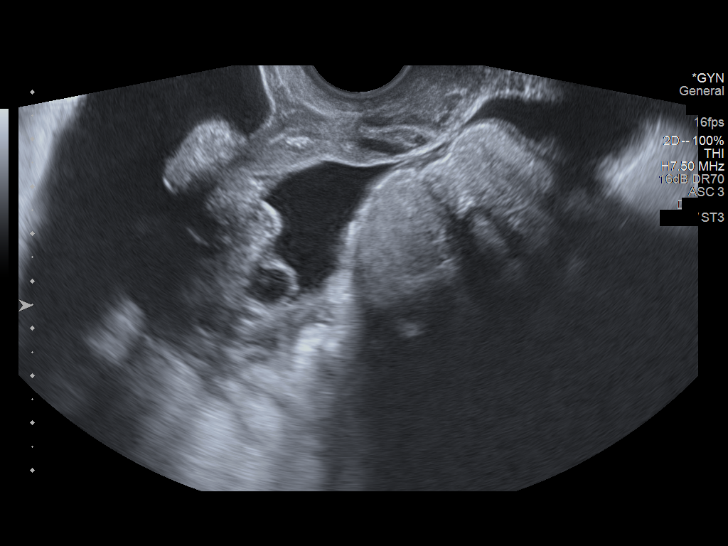
[im 51/51]
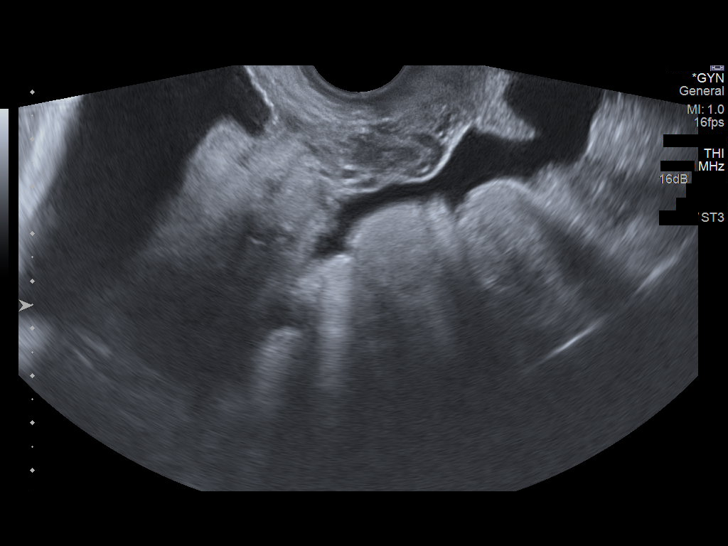

[14 of 25 positions shown; findings below may reference images not displayed]

FINDINGS: Uterus

Measurements: 11.6 x 5.2 x 6.9 cm. No fibroids or other mass
visualized.

Endometrium

Thickness: 6.7 mm. No focal abnormality identified. No retained
products of conception noted. Trace endometrial fluid.

Right ovary

Measurements: 3.1 x 2.1 x 2.9 cm. Normal appearance/no adnexal mass.

Left ovary

Measurements: 2.7 x 2.0 x 2.9 cm. Normal appearance/no adnexal mass.

Other findings

Large amount of free pelvic fluid.
IMPRESSION: 1. Large amount of free pelvic fluid.
2. No evidence of endometrial thickening or retained products of
conception. Trace residual endometrial fluid.

## 2017-08-23 ENCOUNTER — Encounter: Payer: Self-pay | Admitting: Family Medicine

## 2017-08-23 DIAGNOSIS — O09899 Supervision of other high risk pregnancies, unspecified trimester: Secondary | ICD-10-CM | POA: Insufficient documentation

## 2017-09-01 ENCOUNTER — Other Ambulatory Visit (HOSPITAL_COMMUNITY)
Admission: RE | Admit: 2017-09-01 | Discharge: 2017-09-01 | Disposition: A | Discharge status: Home / Self Care | Payer: Medicaid Other | Source: Ambulatory Visit | Attending: Student | Admitting: Student

## 2017-09-01 ENCOUNTER — Ambulatory Visit (INDEPENDENT_AMBULATORY_CARE_PROVIDER_SITE_OTHER): Payer: Medicaid Other | Admitting: Student

## 2017-09-01 VITALS — BP 99/63 | HR 82 | Wt 117.7 lb

## 2017-09-01 DIAGNOSIS — Z3480 Encounter for supervision of other normal pregnancy, unspecified trimester: Secondary | ICD-10-CM

## 2017-09-01 DIAGNOSIS — Z3482 Encounter for supervision of other normal pregnancy, second trimester: Secondary | ICD-10-CM | POA: Diagnosis not present

## 2017-09-01 DIAGNOSIS — Z8679 Personal history of other diseases of the circulatory system: Secondary | ICD-10-CM

## 2017-09-01 DIAGNOSIS — A749 Chlamydial infection, unspecified: Secondary | ICD-10-CM

## 2017-09-01 DIAGNOSIS — O09899 Supervision of other high risk pregnancies, unspecified trimester: Secondary | ICD-10-CM

## 2017-09-01 DIAGNOSIS — O98819 Other maternal infectious and parasitic diseases complicating pregnancy, unspecified trimester: Secondary | ICD-10-CM

## 2017-09-01 NOTE — Patient Instructions (Signed)
CIRCUMCISION  Circumcision is considered an elective/non-medically necessary procedure. There are many reasons parents decide to have their sons circumsized. During the first year of life circumcised males have a reduced risk of urinary tract infections but after this year the rates between circumcised males and uncircumcised males are the same.  It is safe to have your son circumcised outside of the hospital and the places above perform them regularly.    Places to have your son circumcised:    [facility] [phone] $480 by 4 wks  [facility] [phone] $244 by 4 wks  [facility] [phone] $[phone] by 2 wks  [facility] [phone] $[phone] by 7 days [facility] [phone] $150 by 4 wks  These prices sometimes change but are roughly what you can expect to pay. Please call and confirm pricing.    Deciding about Circumcision in Baby Boys  (The Basics)  What is circumcision?  Circumcision is a surgery that removes the skin that covers the tip of the penis, called the "foreskin" Circumcision is usually done when a boy is between 1 and 10 days old. In the [place], circumcision is common. In some other countries, fewer boys are circumcised. Circumcision is a common tradition in some religions.  Should I have my baby boy circumcised?  There is no easy answer. Circumcision has some benefits. But it also has risks. After talking with your doctor, you will have to decide for yourself what is right for your family.  What are the benefits of circumcision?  Circumcised boys seem to have slightly lower rates of: ?Urinary tract infections ?Swelling of the opening at the tip of the penis Circumcised men seem to have slightly lower rates of: ?Urinary tract infections ?Swelling of the opening at the tip of the penis ?Penis  cancer ?HIV and other infections that you catch during sex ?Cervical cancer in the women they have sex with Even so, in the [place], the risks of these problems are small - even in boys and men who have not been circumcised. Plus, boys and men who are not circumcised can reduce these extra risks by: ?Cleaning their penis well ?Using condoms during sex  What are the risks of circumcision?  Risks include: ?Bleeding or infection from the surgery ?Damage to or amputation of the penis ?A chance that the doctor will cut off too much or not enough of the foreskin ?A chance that sex won't feel as good later in life Only about 1 out of every 200 circumcisions leads to problems. There is also a chance that your health insurance won't pay for circumcision.  How is circumcision done in baby boys?  First, the baby gets medicine for pain relief. This might be a cream on the skin or a shot into the base of the penis. Next, the doctor cleans the baby's penis well. Then he or she uses special tools to cut off the foreskin. Finally, the doctor wraps a bandage (called gauze) around the baby's penis. If you have your baby circumcised, his doctor or nurse will give you instructions on how to care for him after the surgery. It is important that you follow those instructions carefully.   

## 2017-09-02 LAB — CERVICOVAGINAL ANCILLARY ONLY
Bacterial vaginitis: POSITIVE — AB
CANDIDA VAGINITIS: NEGATIVE
Chlamydia: POSITIVE — AB
Neisseria Gonorrhea: NEGATIVE
Trichomonas: NEGATIVE

## 2017-09-02 NOTE — Progress Notes (Signed)
   PRENATAL VISIT NOTE  Subjective:  Deanna Sullivan is a 22 y.o. G2P1001 at [redacted]w[redacted]d being seen today for ongoing prenatal care.  She is currently monitored for the following issues for this high-risk pregnancy and has Supervision of other normal pregnancy, antepartum; Retained products of conception after delivery with complications; Pyelonephritis; History of postpartum hemorrhage, currently pregnant; Chlamydia infection, current pregnancy; and History of maternal cardiomyopathy, currently pregnant on their problem list.  Patient reports no complaints.  Contractions: Not present. Vag. Bleeding: None.  Movement: Present. Denies leaking of fluid.   The following portions of the patient's history were reviewed and updated as appropriate: allergies, current medications, past family history, past medical history, past social history, past surgical history and problem list. Problem list updated.  Objective:   Vitals:   09/01/17 1050  BP: 99/63  Pulse: 82  Weight: 117 lb 11.2 oz (53.4 kg)    Fetal Status: Fetal Heart Rate (bpm): 152 Fundal Height: 22 cm Movement: Present     General:  Alert, oriented and cooperative. Patient is in no acute distress.  Skin: Skin is warm and dry. No rash noted.   Cardiovascular: Normal heart rate noted  Respiratory: Normal respiratory effort, no problems with respiration noted  Abdomen: Soft, gravid, appropriate for gestational age.  Pain/Pressure: Present     Pelvic: Cervical exam deferred        Extremities: Normal range of motion.  Edema: None  Mental Status:  Normal mood and affect. Normal behavior. Normal judgment and thought content.   Assessment and Plan:  Pregnancy: G2P1001 at [redacted]w[redacted]d  1. Supervision of normal intrauterine pregnancy in multigravida in second trimester  - [place] MFM OB FOLLOW UP; Future - Cervicovaginal ancillary only  2. Chlamydia TOC today; patient denies discharge, pain, bleeding.  - Cervicovaginal ancillary only  3. History of  cardiomyopathy  - Ambulatory referral to Cardiology  4. Supervision of other normal pregnancy, antepartum -will need to see HR MD from now on  5. History of maternal cardiomyopathy, currently pregnant -referral to cardiology  6. Chlamydia infection, current pregnancy Asymptomatic today  Preterm labor symptoms and general obstetric precautions including but not limited to vaginal bleeding, contractions, leaking of fluid and fetal movement were reviewed in detail with the patient. Please refer to After Visit Summary for other counseling recommendations.  Return in about 4 weeks (around 09/29/2017), or HROB with an MD.   Marylene Land, CNM

## 2017-09-05 ENCOUNTER — Other Ambulatory Visit: Payer: Self-pay | Admitting: Student

## 2017-09-05 ENCOUNTER — Telehealth: Payer: Self-pay | Admitting: Student

## 2017-09-05 ENCOUNTER — Encounter: Payer: Self-pay | Admitting: Student

## 2017-09-05 DIAGNOSIS — N760 Acute vaginitis: Secondary | ICD-10-CM

## 2017-09-05 DIAGNOSIS — B9689 Other specified bacterial agents as the cause of diseases classified elsewhere: Secondary | ICD-10-CM | POA: Insufficient documentation

## 2017-09-05 DIAGNOSIS — A749 Chlamydial infection, unspecified: Secondary | ICD-10-CM

## 2017-09-05 DIAGNOSIS — O98819 Other maternal infectious and parasitic diseases complicating pregnancy, unspecified trimester: Principal | ICD-10-CM

## 2017-09-05 MED ORDER — AZITHROMYCIN 250 MG PO TABS: 1000.0000 mg | ORAL_TABLET | Freq: Once | ORAL | 0 refills | Status: AC

## 2017-09-05 MED ORDER — METRONIDAZOLE 500 MG PO TABS: 500.0000 mg | ORAL_TABLET | Freq: Two times a day (BID) | ORAL | 0 refills | Status: DC

## 2017-09-05 NOTE — Telephone Encounter (Signed)
Attempted to contact patient; Aunt's number is no longer correct and patient's number is a busy signal. Will try again today.

## 2017-09-05 NOTE — Telephone Encounter (Signed)
Attempted to contact patient again; busy signal. Will ask clinic to send certified letter.

## 2017-09-06 ENCOUNTER — Encounter: Payer: Self-pay | Admitting: *Deleted

## 2017-09-06 ENCOUNTER — Telehealth: Payer: Self-pay | Admitting: *Deleted

## 2017-09-06 NOTE — Telephone Encounter (Signed)
Letter sent, STD card to [facility]

## 2017-09-06 NOTE — Telephone Encounter (Signed)
-----   Message from Marylene Land, CNM sent at 09/05/2017  9:25 PM EST ----- This patient tested positive for chlamydia; I tried to reach her twice but patient does not seem to have a working number. Please send her a letter and tell her that she has two RX at the pharmacy and that she needs to abstain from intercourse until her partner has been treated.

## 2017-09-08 ENCOUNTER — Telehealth: Payer: Self-pay

## 2017-09-08 NOTE — Telephone Encounter (Signed)
Sent referral to scheduling 

## 2017-09-09 ENCOUNTER — Encounter: Payer: Self-pay | Admitting: *Deleted

## 2017-09-27 ENCOUNTER — Encounter: Payer: Self-pay | Admitting: *Deleted

## 2017-09-29 ENCOUNTER — Ambulatory Visit (INDEPENDENT_AMBULATORY_CARE_PROVIDER_SITE_OTHER): Payer: Medicaid Other | Admitting: Family Medicine

## 2017-09-29 ENCOUNTER — Ambulatory Visit (HOSPITAL_COMMUNITY)
Admission: RE | Admit: 2017-09-29 | Discharge: 2017-09-29 | Disposition: A | Discharge status: Home / Self Care | Payer: Medicaid Other | Source: Ambulatory Visit | Attending: Student | Admitting: Student

## 2017-09-29 ENCOUNTER — Other Ambulatory Visit: Payer: Self-pay | Admitting: Student

## 2017-09-29 VITALS — BP 115/67 | HR 79 | Wt 122.0 lb

## 2017-09-29 DIAGNOSIS — Z8679 Personal history of other diseases of the circulatory system: Secondary | ICD-10-CM | POA: Diagnosis not present

## 2017-09-29 DIAGNOSIS — Z3483 Encounter for supervision of other normal pregnancy, third trimester: Secondary | ICD-10-CM

## 2017-09-29 DIAGNOSIS — Z3A26 26 weeks gestation of pregnancy: Secondary | ICD-10-CM | POA: Diagnosis not present

## 2017-09-29 DIAGNOSIS — Z363 Encounter for antenatal screening for malformations: Secondary | ICD-10-CM

## 2017-09-29 DIAGNOSIS — Z3480 Encounter for supervision of other normal pregnancy, unspecified trimester: Secondary | ICD-10-CM

## 2017-09-29 DIAGNOSIS — F331 Major depressive disorder, recurrent, moderate: Secondary | ICD-10-CM

## 2017-09-29 DIAGNOSIS — A749 Chlamydial infection, unspecified: Secondary | ICD-10-CM

## 2017-09-29 DIAGNOSIS — Z3482 Encounter for supervision of other normal pregnancy, second trimester: Secondary | ICD-10-CM

## 2017-09-29 NOTE — Patient Instructions (Signed)

## 2017-09-30 ENCOUNTER — Encounter: Payer: Medicaid Other | Admitting: Student

## 2017-09-30 NOTE — Progress Notes (Signed)
   PRENATAL VISIT NOTE  Subjective:  Deanna Sullivan is a 22 y.o. G2P1001 at [redacted]w[redacted]d being seen today for ongoing prenatal care.  She is currently monitored for the following issues for this high-risk pregnancy and has Supervision of other normal pregnancy, antepartum; Retained products of conception after delivery with complications; Pyelonephritis; History of postpartum hemorrhage, currently pregnant; Chlamydia infection, current pregnancy; and History of maternal cardiomyopathy, currently pregnant on their problem list.  Patient reports depression, poor appetite, difficulty sleeping. Denies suicidality..  Contractions: Not present. Vag. Bleeding: None.  Movement: Present. Denies leaking of fluid.   The following portions of the patient's history were reviewed and updated as appropriate: allergies, current medications, past family history, past medical history, past social history, past surgical history and problem list. Problem list updated.  Objective:   Vitals:   09/29/17 1515  BP: 115/67  Pulse: 79  Weight: 122 lb (55.3 kg)    Fetal Status: Fetal Heart Rate (bpm): U/S Fundal Height: 26 cm Movement: Present     General:  Alert, oriented and cooperative. Patient is in no acute distress.  Skin: Skin is warm and dry. No rash noted.   Cardiovascular: Normal heart rate noted  Respiratory: Normal respiratory effort, no problems with respiration noted  Abdomen: Soft, gravid, appropriate for gestational age.  Pain/Pressure: Present     Pelvic: Cervical exam deferred        Extremities: Normal range of motion.  Edema: None  Mental Status:  Normal mood and affect. Normal behavior. Normal judgment and thought content.   Assessment and Plan:  Pregnancy: G2P1001 at [redacted]w[redacted]d  1. Supervision of other normal pregnancy, antepartum Continue prenatal care. Plan for cards referral and f/u ECHO 28 wk labs next visit   2. Moderate episode of recurrent major depressive disorder (HCC) To schedule  appointment with Asher Muir - Ambulatory referral to Integrated Behavior[facility]  Preterm labor symptoms and general obstetric precautions including but not limited to vaginal bleeding, contractions, leaking of fluid and fetal movement were reviewed in detail with the patient. Please refer to After Visit Summary for other counseling recommendations.  Return in 2 weeks (on 10/13/2017) for [facility], needs MD, 28 wk labs.   Reva Bores, MD

## 2017-10-02 NOTE — Progress Notes (Deleted)
Cardiology Office Note    Date:  10/02/2017   ID:  Deanna Sullivan, DOB [birth year], MRN [id number]  PCP:  Department, [facility]  Cardiologist:  Armanda Magic, MD   No chief complaint on file.   History of Present Illness:  Deanna Sullivan is a 22 y.o. female who is being seen today for the evaluation of *** at the request of Jorje Guild*.    Past Medical History:  Diagnosis Date  . [redacted] weeks gestation of pregnancy   . Acute pyelonephritis   . Acute renal failure syndrome (HCC)   . Acute respiratory failure with hypoxia (HCC)   . Anemia   . Aortic dissection (HCC)   . Back pain   . Chlamydia   . E coli bacteremia   . Encounter for fetal anatomic survey   . Fetal size inconsistent with dates   . Fever and chills   . Hx of postpartum hemorrhage, currently pregnant   . Hypokalemia   . Hypomagnesemia   . IUGR, antenatal   . Leucocytosis   . Metabolic acidosis   . Normocytic anemia   . NSVD (normal spontaneous vaginal delivery)   . Peripartum cardiomyopathy   . Poor fetal growth, affecting management of mother, third trimester, not applicable or unspecified fetus   . Postpartum endometritis   . Pyelonephritis   . Retained placenta with hemorrhage, postpartum condition   . Shock, septic (HCC)   . Tachycardia, unspecified     Past Surgical History:  Procedure Laterality Date  . DILATION AND EVACUATION N/A 04/22/2015   Procedure: DILATATION AND EVACUATION;  Surgeon: Adam Phenix, MD;  Location: [facility];  Service: Gynecology;  Laterality: N/A;    Current Medications: No outpatient medications have been marked as taking for the 10/03/17 encounter (Appointment) with Quintella Reichert, MD.    Allergies:   Patient has no known allergies.   Social History   Socioeconomic History  . Marital status: Single    Spouse name: Not on file  . Number of children: Not on file  . Years of education: Not on file  . Highest education level: Not on file    Social Needs  . Financial resource strain: Not on file  . Food insecurity - worry: Not on file  . Food insecurity - inability: Not on file  . Transportation needs - medical: Not on file  . Transportation needs - non-medical: Not on file  Occupational History  . Not on file  Tobacco Use  . Smoking status: Former Smoker    Packs/day: 0.50    Types: Cigarettes    Last attempt to quit: 04/18/2017    Years since quitting: 0.4  . Smokeless tobacco: Never Used  Substance and Sexual Activity  . Alcohol use: No    Comment: occassionally  . Drug use: No  . Sexual activity: Yes    Birth control/protection: None  Other Topics Concern  . Not on file  Social History Narrative  . Not on file     Family History:  The patient's ***family history includes Cancer in her mother.   ROS:   Please see the history of present illness.    ROS All other systems reviewed and are negative.  No flowsheet data found.     PHYSICAL EXAM:   VS:  LMP 03/28/2017    GEN: Well nourished, well developed, in no acute distress  HEENT: normal  Neck: no JVD, carotid bruits, or masses Cardiac: ***RRR; no murmurs, rubs,  or gallops,no edema.  Intact distal pulses bilaterally.  Respiratory:  clear to auscultation bilaterally, normal work of breathing GI: soft, nontender, nondistended, + BS MS: no deformity or atrophy  Skin: warm and dry, no rash Neuro:  Alert and Oriented x 3, Strength and sensation are intact Psych: euthymic mood, full affect  Wt Readings from Last 3 Encounters:  09/29/17 122 lb (55.3 kg)  09/01/17 117 lb 11.2 oz (53.4 kg)  08/02/17 115 lb 8 oz (52.4 kg)      Studies/Labs Reviewed:   EKG:  EKG is*** ordered today.  The ekg ordered today demonstrates ***  Recent Labs: 11/04/2016: ALT 10; BUN 12; Creatinine, Ser 0.71; Potassium 3.7; Sodium 137 08/02/2017: Hemoglobin 11.2; Platelets 273   Lipid Panel No results found for: CHOL, TRIG, HDL, CHOLHDL, VLDL, LDLCALC,  LDLDIRECT  Additional studies/ records that were reviewed today include:  ***    ASSESSMENT:    No diagnosis found.   PLAN:  In order of problems listed above:  1. ***    Medication Adjustments/Labs and Tests Ordered: Current medicines are reviewed at length with the patient today.  Concerns regarding medicines are outlined above.  Medication changes, Labs and Tests ordered today are listed in the Patient Instructions below.  There are no Patient Instructions on file for this visit.   Signed, Armanda Magic, MD  10/02/2017 9:48 PM    [facility] [address] [place], [place], [place]  [address] Phone: [phone]; Fax: [phone]

## 2017-10-03 ENCOUNTER — Ambulatory Visit: Payer: Medicaid Other | Admitting: Cardiology

## 2017-10-06 ENCOUNTER — Encounter: Payer: Self-pay | Admitting: Cardiology

## 2017-10-10 ENCOUNTER — Telehealth: Payer: Self-pay | Admitting: General Practice

## 2017-10-10 ENCOUNTER — Other Ambulatory Visit: Payer: Self-pay | Admitting: General Practice

## 2017-10-10 DIAGNOSIS — O09899 Supervision of other high risk pregnancies, unspecified trimester: Secondary | ICD-10-CM

## 2017-10-10 NOTE — Telephone Encounter (Signed)
-----   Message from Conan Bowens, MD sent at 09/22/2017 12:24 PM EST ----- Please get this patient a cardiology referral and scheduled for an echo. Thank you.

## 2017-10-10 NOTE — Telephone Encounter (Signed)
Called [facility] at [facility], patient had appt scheduled for 2/18 that she no showed for. Next available appt is 4/11 @ 840am. Called patient and phone number is busy each phone call. Will send mychart message as patient has been difficult to reach by phone.

## 2017-10-13 ENCOUNTER — Other Ambulatory Visit: Payer: Medicaid Other

## 2017-10-13 ENCOUNTER — Other Ambulatory Visit: Payer: Self-pay

## 2017-10-13 DIAGNOSIS — Z3480 Encounter for supervision of other normal pregnancy, unspecified trimester: Secondary | ICD-10-CM

## 2017-10-19 ENCOUNTER — Encounter: Payer: Medicaid Other | Admitting: Obstetrics and Gynecology

## 2017-10-27 ENCOUNTER — Telehealth: Payer: Self-pay | Admitting: Licensed Clinical Social Worker

## 2017-10-27 NOTE — Telephone Encounter (Signed)
CSW A. Felton Clinton telephone pt at [phone] this # is busy, [phone] wrong ph number, [phone] detail message left w callback ph number [phone] on vmail. CSW A. Felton Clinton emailed pt requesting callback and follow up with WOC.   Email sent to pt:  Deanna Sullivan My name is Deanna Sullivan I am the Clinical Social Worker for [employer] for [employer] at [facility]. I would like to collaborate with you to ensure you reach the best prenatal care. I have called several phone numbers trying to reach you. Please respond to my email with the best contact information or feel free to call me at [phone].  Deanna Sullivan, MSW, LCSWA Clinical Social Worker I Center for [employer] at [facility]  [address]  [place], [place] [address]

## 2017-11-16 NOTE — BH Specialist Note (Deleted)
Integrated Behavioral Health Initial Visit  MRN: [id number] Name: Deanna Sullivan  Number of Integrated Behavioral Health Clinician visits:: 1/6 Session Start time: ***  Session End time: *** Total time: {IBH Total Time:21014050}  Type of Service: Integrated Behavioral Health- Individual/Family Interpretor:No. Interpretor Name and Language: n/a   Warm Hand Off Completed.       SUBJECTIVE: Deanna Sullivan is a 22 y.o. female accompanied by {CHL AMB ACCOMPANIED RC:7893810175} Patient was referred by Dr Shawnie Pons for Moderate episode of recurrent major depressive disorder. Patient reports the following symptoms/concerns: *** Duration of problem: ***; Severity of problem: {Mild/Moderate/Severe:20260}  OBJECTIVE: Mood: {BHH MOOD:22306} and Affect: {BHH AFFECT:22307} Risk of harm to self or others: {CHL AMB BH Suicide Current Mental Status:21022748}  LIFE CONTEXT: Family and Social: *** School/Work: *** Self-Care: *** Life Changes: Current pregnancy ***  GOALS ADDRESSED: Patient will: 1. Reduce symptoms of: {IBH Symptoms:21014056} 2. Increase knowledge and/or ability of: {IBH Patient Tools:21014057}  3. Demonstrate ability to: {IBH Goals:21014053}  INTERVENTIONS: Interventions utilized: {IBH Interventions:21014054}  Standardized Assessments completed: {IBH Screening Tools:21014051}  ASSESSMENT: Patient currently experiencing ***.   Patient may benefit from psychoeducation and brief therapeutic interventions regarding coping with symptoms of *** .  PLAN: 1. Follow up with behavioral health clinician on : *** 2. Behavioral recommendations: *** 3. Referral(s): {IBH Referrals:21014055} 4. "From scale of 1-10, how likely are you to follow plan?": ***  Rae Lips, LCSW  Depression screen [facility] 2/9 09/29/2017 08/02/2017 03/07/2015 02/28/2015 02/21/2015  Decreased Interest 1 1 0 0 0  Down, Depressed, Hopeless 0 1 0 0 0  PHQ - 2 Score 1 2 0 0 0  Altered sleeping 2 3 - - -   Tired, decreased energy 1 2 - - -  Change in appetite 3 2 - - -  Feeling bad or failure about yourself  0 0 - - -  Trouble concentrating 0 0 - - -  Moving slowly or fidgety/restless 0 0 - - -  Suicidal thoughts 0 0 - - -  PHQ-9 Score 7 9 - - -   GAD 7 : Generalized Anxiety Score 09/29/2017 08/02/2017  Nervous, Anxious, on Edge 1 0  Control/stop worrying 0 0  Worry too much - different things 0 0  Trouble relaxing 1 0  Restless 0 0  Easily annoyed or irritable 1 1  Afraid - awful might happen 0 0  Total GAD 7 Score 3 1

## 2017-11-17 ENCOUNTER — Institutional Professional Consult (permissible substitution): Payer: Medicaid Other

## 2017-11-21 ENCOUNTER — Encounter: Payer: Medicaid Other | Admitting: Obstetrics & Gynecology

## 2017-11-24 ENCOUNTER — Ambulatory Visit: Payer: Medicaid Other | Admitting: Cardiology

## 2017-11-25 ENCOUNTER — Ambulatory Visit (INDEPENDENT_AMBULATORY_CARE_PROVIDER_SITE_OTHER): Payer: Medicaid Other | Admitting: Obstetrics & Gynecology

## 2017-11-25 ENCOUNTER — Encounter: Payer: Self-pay | Admitting: Cardiology

## 2017-11-25 VITALS — BP 109/62 | HR 96 | Wt 129.0 lb

## 2017-11-25 DIAGNOSIS — Z3480 Encounter for supervision of other normal pregnancy, unspecified trimester: Secondary | ICD-10-CM

## 2017-11-25 DIAGNOSIS — Z23 Encounter for immunization: Secondary | ICD-10-CM | POA: Diagnosis not present

## 2017-11-25 DIAGNOSIS — O09893 Supervision of other high risk pregnancies, third trimester: Secondary | ICD-10-CM

## 2017-11-25 DIAGNOSIS — O09899 Supervision of other high risk pregnancies, unspecified trimester: Secondary | ICD-10-CM

## 2017-11-25 NOTE — Progress Notes (Signed)
   PRENATAL VISIT NOTE  Subjective:  Deanna Sullivan is a 22 y.o. G2P1001 at [redacted]w[redacted]d being seen today for ongoing prenatal care.  She is currently monitored for the following issues for this high-risk pregnancy and has Supervision of other normal pregnancy, antepartum; Retained products of conception after delivery with complications; History of postpartum hemorrhage, currently pregnant; Chlamydia infection, current pregnancy; and History of maternal cardiomyopathy, currently pregnant on their problem list.  Patient reports no complaints.  Contractions: Irregular. Vag. Bleeding: None.  Movement: Present. Denies leaking of fluid.   The following portions of the patient's history were reviewed and updated as appropriate: allergies, current medications, past family history, past medical history, past social history, past surgical history and problem list. Problem list updated.  Objective:   Vitals:   11/25/17 1129  BP: 109/62  Pulse: 96  Weight: 129 lb (58.5 kg)    Fetal Status: Fetal Heart Rate (bpm): 139   Movement: Present     General:  Alert, oriented and cooperative. Patient is in no acute distress.  Skin: Skin is warm and dry. No rash noted.   Cardiovascular: Normal heart rate noted  Respiratory: Normal respiratory effort, no problems with respiration noted  Abdomen: Soft, gravid, appropriate for gestational age.  Pain/Pressure: Present     Pelvic: Cervical exam deferred        Extremities: Normal range of motion.  Edema: Trace  Mental Status: Normal mood and affect. Normal behavior. Normal judgment and thought content.   Assessment and Plan:  Pregnancy: G2P1001 at [redacted]w[redacted]d  1. Supervision of other normal pregnancy, antepartum  - Tdap vaccine greater than or equal to 7yo IM  2. History of maternal cardiomyopathy, currently pregnant Echo and f/u  Preterm labor symptoms and general obstetric precautions including but not limited to vaginal bleeding, contractions, leaking of fluid  and fetal movement were reviewed in detail with the patient. Please refer to After Visit Summary for other counseling recommendations.  Return in about 1 week (around 12/02/2017) for 2 hr GTT.  No future appointments.  Scheryl Darter, MD

## 2017-11-25 NOTE — Patient Instructions (Signed)

## 2017-11-30 ENCOUNTER — Other Ambulatory Visit: Payer: Medicaid Other

## 2017-12-01 ENCOUNTER — Other Ambulatory Visit (HOSPITAL_COMMUNITY): Payer: Medicaid Other

## 2017-12-08 ENCOUNTER — Ambulatory Visit (HOSPITAL_COMMUNITY): Payer: Medicaid Other | Attending: Cardiology

## 2017-12-08 ENCOUNTER — Other Ambulatory Visit: Payer: Self-pay

## 2017-12-08 ENCOUNTER — Encounter: Payer: Self-pay | Admitting: Physician Assistant

## 2017-12-08 DIAGNOSIS — R000 Tachycardia, unspecified: Secondary | ICD-10-CM | POA: Diagnosis not present

## 2017-12-08 DIAGNOSIS — Z3480 Encounter for supervision of other normal pregnancy, unspecified trimester: Secondary | ICD-10-CM | POA: Diagnosis present

## 2017-12-08 DIAGNOSIS — Z331 Pregnant state, incidental: Secondary | ICD-10-CM

## 2017-12-08 DIAGNOSIS — O903 Peripartum cardiomyopathy: Secondary | ICD-10-CM

## 2017-12-08 DIAGNOSIS — R6521 Severe sepsis with septic shock: Secondary | ICD-10-CM | POA: Insufficient documentation

## 2017-12-12 ENCOUNTER — Ambulatory Visit (INDEPENDENT_AMBULATORY_CARE_PROVIDER_SITE_OTHER): Payer: Medicaid Other | Admitting: Obstetrics & Gynecology

## 2017-12-12 ENCOUNTER — Other Ambulatory Visit (HOSPITAL_COMMUNITY)
Admission: RE | Admit: 2017-12-12 | Discharge: 2017-12-12 | Disposition: A | Discharge status: Home / Self Care | Payer: Medicaid Other | Source: Ambulatory Visit | Attending: Obstetrics & Gynecology | Admitting: Obstetrics & Gynecology

## 2017-12-12 VITALS — BP 119/67 | HR 91 | Wt 136.4 lb

## 2017-12-12 DIAGNOSIS — Z3A00 Weeks of gestation of pregnancy not specified: Secondary | ICD-10-CM | POA: Insufficient documentation

## 2017-12-12 DIAGNOSIS — Z3480 Encounter for supervision of other normal pregnancy, unspecified trimester: Secondary | ICD-10-CM | POA: Diagnosis not present

## 2017-12-12 DIAGNOSIS — Z3483 Encounter for supervision of other normal pregnancy, third trimester: Secondary | ICD-10-CM

## 2017-12-12 LAB — OB RESULTS CONSOLE GBS: GBS: NEGATIVE

## 2017-12-12 NOTE — Patient Instructions (Signed)
Vaginal Delivery Vaginal delivery means that you will give birth by pushing your baby out of your birth canal (vagina). A team of health care providers will help you before, during, and after vaginal delivery. Birth experiences are unique for every woman and every pregnancy, and birth experiences vary depending on where you choose to give birth. What should I do to prepare for my baby's birth? Before your baby is born, it is important to talk with your health care provider about:  Your labor and delivery preferences. These may include: ? Medicines that you may be given. ? How you will manage your pain. This might include non-medical pain relief techniques or injectable pain relief such as epidural analgesia. ? How you and your baby will be monitored during labor and delivery. ? Who may be in the labor and delivery room with you. ? Your feelings about surgical delivery of your baby (cesarean delivery, or C-section) if this becomes necessary. ? Your feelings about receiving donated blood through an IV tube (blood transfusion) if this becomes necessary.  Whether you are able: ? To take pictures or videos of the birth. ? To eat during labor and delivery. ? To move around, walk, or change positions during labor and delivery.  What to expect after your baby is born, such as: ? Whether delayed umbilical cord clamping and cutting is offered. ? Who will care for your baby right after birth. ? Medicines or tests that may be recommended for your baby. ? Whether breastfeeding is supported in your hospital or birth center. ? How long you will be in the hospital or birth center.  How any medical conditions you have may affect your baby or your labor and delivery experience.  To prepare for your baby's birth, you should also:  Attend all of your health care visits before delivery (prenatal visits) as recommended by your health care provider. This is important.  Prepare your home for your baby's  arrival. Make sure that you have: ? Diapers. ? Baby clothing. ? Feeding equipment. ? Safe sleeping arrangements for you and your baby.  Install a car seat in your vehicle. Have your car seat checked by a certified car seat installer to make sure that it is installed safely.  Think about who will help you with your new baby at home for at least the first several weeks after delivery.  What can I expect when I arrive at the birth center or hospital? Once you are in labor and have been admitted into the hospital or birth center, your health care provider may:  Review your pregnancy history and any concerns you have.  Insert an IV tube into one of your veins. This is used to give you fluids and medicines.  Check your blood pressure, pulse, temperature, and heart rate (vital signs).  Check whether your bag of water (amniotic sac) has broken (ruptured).  Talk with you about your birth plan and discuss pain control options.  Monitoring Your health care provider may monitor your contractions (uterine monitoring) and your baby's heart rate (fetal monitoring). You may need to be monitored:  Often, but not continuously (intermittently).  All the time or for long periods at a time (continuously). Continuous monitoring may be needed if: ? You are taking certain medicines, such as medicine to relieve pain or make your contractions stronger. ? You have pregnancy or labor complications.  Monitoring may be done by:  Placing a special stethoscope or a handheld monitoring device on your abdomen to   check your baby's heartbeat, and feeling your abdomen for contractions. This method of monitoring does not continuously record your baby's heartbeat or your contractions.  Placing monitors on your abdomen (external monitors) to record your baby's heartbeat and the frequency and length of contractions. You may not have to wear external monitors all the time.  Placing monitors inside of your uterus  (internal monitors) to record your baby's heartbeat and the frequency, length, and strength of your contractions. ? Your health care provider may use internal monitors if he or she needs more information about the strength of your contractions or your baby's heart rate. ? Internal monitors are put in place by passing a thin, flexible wire through your vagina and into your uterus. Depending on the type of monitor, it may remain in your uterus or on your baby's head until birth. ? Your health care provider will discuss the benefits and risks of internal monitoring with you and will ask for your permission before inserting the monitors.  Telemetry. This is a type of continuous monitoring that can be done with external or internal monitors. Instead of having to stay in bed, you are able to move around during telemetry. Ask your health care provider if telemetry is an option for you.  Physical exam Your health care provider may perform a physical exam. This may include:  Checking whether your baby is positioned: ? With the head toward your vagina (head-down). This is most common. ? With the head toward the top of your uterus (head-up or breech). If your baby is in a breech position, your health care provider may try to turn your baby to a head-down position so you can deliver vaginally. If it does not seem that your baby can be born vaginally, your provider may recommend surgery to deliver your baby. In rare cases, you may be able to deliver vaginally if your baby is head-up (breech delivery). ? Lying sideways (transverse). Babies that are lying sideways cannot be delivered vaginally.  Checking your cervix to determine: ? Whether it is thinning out (effacing). ? Whether it is opening up (dilating). ? How low your baby has moved into your birth canal.  What are the three stages of labor and delivery?  Normal labor and delivery is divided into the following three stages: Stage 1  Stage 1 is the  longest stage of labor, and it can last for hours or days. Stage 1 includes: ? Early labor. This is when contractions may be irregular, or regular and mild. Generally, early labor contractions are more than 10 minutes apart. ? Active labor. This is when contractions get longer, more regular, more frequent, and more intense. ? The transition phase. This is when contractions happen very close together, are very intense, and may last longer than during any other part of labor.  Contractions generally feel mild, infrequent, and irregular at first. They get stronger, more frequent (about every 2-3 minutes), and more regular as you progress from early labor through active labor and transition.  Many women progress through stage 1 naturally, but you may need help to continue making progress. If this happens, your health care provider may talk with you about: ? Rupturing your amniotic sac if it has not ruptured yet. ? Giving you medicine to help make your contractions stronger and more frequent.  Stage 1 ends when your cervix is completely dilated to 4 inches (10 cm) and completely effaced. This happens at the end of the transition phase. Stage 2  Once   your cervix is completely effaced and dilated to 4 inches (10 cm), you may start to feel an urge to push. It is common for the body to naturally take a rest before feeling the urge to push, especially if you received an epidural or certain other pain medicines. This rest period may last for up to 1-2 hours, depending on your unique labor experience.  During stage 2, contractions are generally less painful, because pushing helps relieve contraction pain. Instead of contraction pain, you may feel stretching and burning pain, especially when the widest part of your baby's head passes through the vaginal opening (crowning).  Your health care provider will closely monitor your pushing progress and your baby's progress through the vagina during stage 2.  Your  health care provider may massage the area of skin between your vaginal opening and anus (perineum) or apply warm compresses to your perineum. This helps it stretch as the baby's head starts to crown, which can help prevent perineal tearing. ? In some cases, an incision may be made in your perineum (episiotomy) to allow the baby to pass through the vaginal opening. An episiotomy helps to make the opening of the vagina larger to allow more room for the baby to fit through.  It is very important to breathe and focus so your health care provider can control the delivery of your baby's head. Your health care provider may have you decrease the intensity of your pushing, to help prevent perineal tearing.  After delivery of your baby's head, the shoulders and the rest of the body generally deliver very quickly and without difficulty.  Once your baby is delivered, the umbilical cord may be cut right away, or this may be delayed for 1-2 minutes, depending on your baby's health. This may vary among health care providers, hospitals, and birth centers.  If you and your baby are healthy enough, your baby may be placed on your chest or abdomen to help maintain the baby's temperature and to help you bond with each other. Some mothers and babies start breastfeeding at this time. Your health care team will dry your baby and help keep your baby warm during this time.  Your baby may need immediate care if he or she: ? Showed signs of distress during labor. ? Has a medical condition. ? Was born too early (prematurely). ? Had a bowel movement before birth (meconium). ? Shows signs of difficulty transitioning from being inside the uterus to being outside of the uterus. If you are planning to breastfeed, your health care team will help you begin a feeding. Stage 3  The third stage of labor starts immediately after the birth of your baby and ends after you deliver the placenta. The placenta is an organ that develops  during pregnancy to provide oxygen and nutrients to your baby in the womb.  Delivering the placenta may require some pushing, and you may have mild contractions. Breastfeeding can stimulate contractions to help you deliver the placenta.  After the placenta is delivered, your uterus should tighten (contract) and become firm. This helps to stop bleeding in your uterus. To help your uterus contract and to control bleeding, your health care provider may: ? Give you medicine by injection, through an IV tube, by mouth, or through your rectum (rectally). ? Massage your abdomen or perform a vaginal exam to remove any blood clots that are left in your uterus. ? Empty your bladder by placing a thin, flexible tube (catheter) into your bladder. ? Encourage   you to breastfeed your baby. After labor is over, you and your baby will be monitored closely to ensure that you are both healthy until you are ready to go home. Your health care team will teach you how to care for yourself and your baby. This information is not intended to replace advice given to you by your health care provider. Make sure you discuss any questions you have with your health care provider. Document Released: 05/11/2008 Document Revised: 02/20/2016 Document Reviewed: 08/17/2015 Elsevier Interactive Patient Education  2018 [employer]  

## 2017-12-12 NOTE — Progress Notes (Signed)
   PRENATAL VISIT NOTE  Subjective:  Deanna Sullivan is a 22 y.o. G2P1001 at [redacted]w[redacted]d being seen today for ongoing prenatal care.  She is currently monitored for the following issues for this high-risk pregnancy and has Supervision of other normal pregnancy, antepartum; Retained products of conception after delivery with complications; History of postpartum hemorrhage, currently pregnant; Chlamydia infection, current pregnancy; and History of maternal cardiomyopathy, currently pregnant on their problem list.  Patient reports occasional contractions.  Contractions: Irregular. Vag. Bleeding: None.  Movement: Present. Denies leaking of fluid.   The following portions of the patient's history were reviewed and updated as appropriate: allergies, current medications, past family history, past medical history, past social history, past surgical history and problem list. Problem list updated.  Objective:   Vitals:   12/12/17 1440  BP: 119/67  Pulse: 91  Weight: 136 lb 6.4 oz (61.9 kg)    Fetal Status: Fetal Heart Rate (bpm): 141   Movement: Present     General:  Alert, oriented and cooperative. Patient is in no acute distress.  Skin: Skin is warm and dry. No rash noted.   Cardiovascular: Normal heart rate noted  Respiratory: Normal respiratory effort, no problems with respiration noted  Abdomen: Soft, gravid, appropriate for gestational age.  Pain/Pressure: Present     Pelvic: Cervical exam performed        Extremities: Normal range of motion.  Edema: Trace  Mental Status: Normal mood and affect. Normal behavior. Normal judgment and thought content.   Assessment and Plan:  Pregnancy: G2P1001 at [redacted]w[redacted]d  1. Supervision of other normal pregnancy, antepartum Routine testing - Culture, beta strep (group b only) - GC/Chlamydia probe amp ([place])not at [facility]  Term labor symptoms and general obstetric precautions including but not limited to vaginal bleeding, contractions, leaking of fluid and  fetal movement were reviewed in detail with the patient. Please refer to After Visit Summary for other counseling recommendations.  Return in about 1 week (around 12/19/2017).  Future Appointments  Date Time Provider Department Center  12/16/2017  8:35 AM Adam Phenix, MD [facility]  12/23/2017  8:55 AM Reva Bores, MD [facility]  12/26/2017  3:15 PM Beatrice Lecher, PA-C [facility]  12/29/2017  8:55 AM Reva Bores, MD [facility]  01/06/2018  8:15 AM Adam Phenix, MD [facility]    Scheryl Darter, MD

## 2017-12-13 LAB — GC/CHLAMYDIA PROBE AMP (~~LOC~~) NOT AT ARMC
CHLAMYDIA, DNA PROBE: NEGATIVE
NEISSERIA GONORRHEA: NEGATIVE

## 2017-12-16 ENCOUNTER — Inpatient Hospital Stay (HOSPITAL_COMMUNITY)
Admission: AD | Admit: 2017-12-16 | Discharge: 2017-12-16 | Disposition: A | Discharge status: Home / Self Care | Payer: Medicaid Other | Source: Ambulatory Visit | Attending: Obstetrics & Gynecology | Admitting: Obstetrics & Gynecology

## 2017-12-16 ENCOUNTER — Encounter: Payer: Medicaid Other | Admitting: Obstetrics & Gynecology

## 2017-12-16 ENCOUNTER — Encounter (HOSPITAL_COMMUNITY): Payer: Self-pay | Admitting: *Deleted

## 2017-12-16 DIAGNOSIS — Z3A37 37 weeks gestation of pregnancy: Secondary | ICD-10-CM | POA: Insufficient documentation

## 2017-12-16 DIAGNOSIS — O471 False labor at or after 37 completed weeks of gestation: Secondary | ICD-10-CM | POA: Diagnosis not present

## 2017-12-16 DIAGNOSIS — Z87891 Personal history of nicotine dependence: Secondary | ICD-10-CM | POA: Insufficient documentation

## 2017-12-16 DIAGNOSIS — O4693 Antepartum hemorrhage, unspecified, third trimester: Secondary | ICD-10-CM | POA: Diagnosis present

## 2017-12-16 DIAGNOSIS — N898 Other specified noninflammatory disorders of vagina: Secondary | ICD-10-CM

## 2017-12-16 LAB — CULTURE, BETA STREP (GROUP B ONLY): STREP GP B CULTURE: NEGATIVE

## 2017-12-16 NOTE — MAU Note (Signed)
Pt arrived by EMS with c/o ctx since 4pm 1-2 every hour. Reports she started having some spotting but bleeding got heavier. Reports good fetal movement.

## 2017-12-16 NOTE — MAU Provider Note (Signed)
History     CSN: [id number]  Arrival date and time: 12/16/17 2040   First Provider Initiated Contact with Patient 12/16/17 2109     Chief Complaint  Patient presents with  . Contractions  . Vaginal Bleeding   HPI Deanna Sullivan is a 22 y.o. G2P1001 at [redacted]w[redacted]d who presents via EMS for contractions and vaginal bleeding. She states she has had one contraction per hour for the last 5 hours. She also reports spotting all day that became heavier but less than a period this evening. She denies any leaking of fluid. Reports good fetal movement.   OB History    Gravida  2   Para  1   Term  1   Preterm      AB      Living  1     SAB      TAB      Ectopic      Multiple  0   Live Births  1           Past Medical History:  Diagnosis Date  . [redacted] weeks gestation of pregnancy   . Acute pyelonephritis   . Acute renal failure syndrome (HCC)   . Acute respiratory failure with hypoxia (HCC)   . Anemia   . Aortic dissection (HCC)   . Back pain   . Chlamydia   . E coli bacteremia   . Encounter for fetal anatomic survey   . Fetal size inconsistent with dates   . Fever and chills   . Hx of postpartum hemorrhage, currently pregnant   . Hypokalemia   . Hypomagnesemia   . IUGR, antenatal   . Leucocytosis   . Metabolic acidosis   . Normocytic anemia   . NSVD (normal spontaneous vaginal delivery)   . Peripartum cardiomyopathy   . Poor fetal growth, affecting management of mother, third trimester, not applicable or unspecified fetus   . Postpartum endometritis   . Pyelonephritis   . Retained placenta with hemorrhage, postpartum condition   . Shock, septic (HCC)   . Tachycardia, unspecified     Past Surgical History:  Procedure Laterality Date  . DILATION AND EVACUATION N/A 04/22/2015   Procedure: DILATATION AND EVACUATION;  Surgeon: Adam Phenix, MD;  Location: [facility];  Service: Gynecology;  Laterality: N/A;    Family History  Problem Relation Age of Onset  . Cancer  Mother     Social History   Tobacco Use  . Smoking status: Former Smoker    Packs/day: 0.50    Types: Cigarettes    Last attempt to quit: 04/18/2017    Years since quitting: 0.6  . Smokeless tobacco: Never Used  Substance Use Topics  . Alcohol use: No    Comment: occassionally  . Drug use: No    Allergies: No Known Allergies  Medications Prior to Admission  Medication Sig Dispense Refill Last Dose  . Prenatal Vit-Fe Fumarate-FA (MULTIVITAMIN-PRENATAL) 27-0.8 MG TABS tablet Take 1 tablet by mouth daily at 12 noon.   12/16/2017 at Unknown time    Review of Systems  Constitutional: Negative.  Negative for fatigue and fever.  HENT: Negative.   Respiratory: Negative.  Negative for shortness of breath.   Cardiovascular: Negative.  Negative for chest pain.  Gastrointestinal: Positive for abdominal pain. Negative for constipation, diarrhea, nausea and vomiting.  Genitourinary: Positive for vaginal bleeding. Negative for dysuria.  Neurological: Negative.  Negative for dizziness and headaches.   Physical Exam   Blood pressure 115/63,  pulse 89, temperature 98.3 F (36.8 C), resp. rate 18, height 5\' 3"  (1.6 m), weight 136 lb (61.7 kg), last menstrual period 03/28/2017, currently breastfeeding.  Physical Exam  Nursing note and vitals reviewed. Constitutional: She is oriented to person, place, and time. She appears well-developed and well-nourished. No distress.  HENT:  Head: Normocephalic.  Eyes: Pupils are equal, round, and reactive to light.  Cardiovascular: Normal rate, regular rhythm and normal heart sounds.  Respiratory: Effort normal and breath sounds normal. No respiratory distress.  GI: Soft. Bowel sounds are normal. She exhibits no distension. There is no tenderness.  Genitourinary:  Genitourinary Comments: SSE: small amount of light brown discharge. NO blood noted in vagina  Neurological: She is alert and oriented to person, place, and time.  Skin: Skin is warm and dry.   Psychiatric: She has a normal mood and affect. Her behavior is normal. Judgment and thought content normal.    Dilation: 1.5 Effacement (%): 80 Station: Ballotable Presentation: Vertex Exam by:: C.Neil,CNM  Fetal Tracing:  Baseline: 130 Variability: moderate Accels: 15x15 Decels: none  Toco: ui   MAU Course  Procedures  MDM NST reactive No bleeding noted and no contractions while in MAU  Assessment and Plan   1. False labor after 37 completed weeks of gestation   2. Vaginal discharge    -Discharge home in stable condition -Labor precautions discussed -Patient advised to follow-up with [facility] as scheduled for prenatal care -Patient may return to MAU as needed or if her condition were to change or worsen  Rolm Bookbinder CNM 12/16/2017, 9:09 PM

## 2017-12-16 NOTE — Discharge Instructions (Signed)
Braxton Hicks Contractions °Contractions of the uterus can occur throughout pregnancy, but they are not always a sign that you are in labor. You may have practice contractions called Braxton Hicks contractions. These false labor contractions are sometimes confused with true labor. °What are Braxton Hicks contractions? °Braxton Hicks contractions are tightening movements that occur in the muscles of the uterus before labor. Unlike true labor contractions, these contractions do not result in opening (dilation) and thinning of the cervix. Toward the end of pregnancy (32-34 weeks), Braxton Hicks contractions can happen more often and may become stronger. These contractions are sometimes difficult to tell apart from true labor because they can be very uncomfortable. You should not feel embarrassed if you go to the hospital with false labor. °Sometimes, the only way to tell if you are in true labor is for your health care provider to look for changes in the cervix. The health care provider will do a physical exam and may monitor your contractions. If you are not in true labor, the exam should show that your cervix is not dilating and your water has not broken. °If there are other health problems associated with your pregnancy, it is completely safe for you to be sent home with false labor. You may continue to have Braxton Hicks contractions until you go into true labor. °How to tell the difference between true labor and false labor °True labor °· Contractions last 30-70 seconds. °· Contractions become very regular. °· Discomfort is usually felt in the top of the uterus, and it spreads to the lower abdomen and low back. °· Contractions do not go away with walking. °· Contractions usually become more intense and increase in frequency. °· The cervix dilates and gets thinner. °False labor °· Contractions are usually shorter and not as strong as true labor contractions. °· Contractions are usually irregular. °· Contractions  are often felt in the front of the lower abdomen and in the groin. °· Contractions may go away when you walk around or change positions while lying down. °· Contractions get weaker and are shorter-lasting as time goes on. °· The cervix usually does not dilate or become thin. °Follow these instructions at home: °· Take over-the-counter and prescription medicines only as told by your health care provider. °· Keep up with your usual exercises and follow other instructions from your health care provider. °· Eat and drink lightly if you think you are going into labor. °· If Braxton Hicks contractions are making you uncomfortable: °? Change your position from lying down or resting to walking, or change from walking to resting. °? Sit and rest in a tub of warm water. °? Drink enough fluid to keep your urine pale yellow. Dehydration may cause these contractions. °? Do slow and deep breathing several times an hour. °· Keep all follow-up prenatal visits as told by your health care provider. This is important. °Contact a health care provider if: °· You have a fever. °· You have continuous pain in your abdomen. °Get help right away if: °· Your contractions become stronger, more regular, and closer together. °· You have fluid leaking or gushing from your vagina. °· You pass blood-tinged mucus (bloody show). °· You have bleeding from your vagina. °· You have low back pain that you never had before. °· You feel your baby’s head pushing down and causing pelvic pressure. °· Your baby is not moving inside you as much as it used to. °Summary °· Contractions that occur before labor are called Braxton   Hicks contractions, false labor, or practice contractions. °· Braxton Hicks contractions are usually shorter, weaker, farther apart, and less regular than true labor contractions. True labor contractions usually become progressively stronger and regular and they become more frequent. °· Manage discomfort from Braxton Hicks contractions by  changing position, resting in a warm bath, drinking plenty of water, or practicing deep breathing. °This information is not intended to replace advice given to you by your health care provider. Make sure you discuss any questions you have with your health care provider. °Document Released: 12/16/2016 Document Revised: 12/16/2016 Document Reviewed: 12/16/2016 °[employer] Patient Education © 2018 [employer] ° °

## 2017-12-23 ENCOUNTER — Ambulatory Visit (INDEPENDENT_AMBULATORY_CARE_PROVIDER_SITE_OTHER): Payer: Medicaid Other | Admitting: Family Medicine

## 2017-12-23 ENCOUNTER — Other Ambulatory Visit: Payer: Self-pay | Admitting: General Practice

## 2017-12-23 VITALS — BP 107/61 | HR 96 | Wt 136.0 lb

## 2017-12-23 DIAGNOSIS — O09299 Supervision of pregnancy with other poor reproductive or obstetric history, unspecified trimester: Secondary | ICD-10-CM

## 2017-12-23 DIAGNOSIS — Z3480 Encounter for supervision of other normal pregnancy, unspecified trimester: Secondary | ICD-10-CM

## 2017-12-23 DIAGNOSIS — O09899 Supervision of other high risk pregnancies, unspecified trimester: Secondary | ICD-10-CM

## 2017-12-23 NOTE — Patient Instructions (Addendum)
Third Trimester of Pregnancy The third trimester is from week 28 through week 40 (months 7 through 9). The third trimester is a time when the unborn baby (fetus) is growing rapidly. At the end of the ninth month, the fetus is about 20 inches in length and weighs 6-10 pounds. Body changes during your third trimester Your body will continue to go through many changes during pregnancy. The changes vary from woman to woman. During the third trimester:  Your weight will continue to increase. You can expect to gain 25-35 pounds (11-16 kg) by the end of the pregnancy.  You may begin to get stretch marks on your hips, abdomen, and breasts.  You may urinate more often because the fetus is moving lower into your pelvis and pressing on your bladder.  You may develop or continue to have heartburn. This is caused by increased hormones that slow down muscles in the digestive tract.  You may develop or continue to have constipation because increased hormones slow digestion and cause the muscles that push waste through your intestines to relax.  You may develop hemorrhoids. These are swollen veins (varicose veins) in the rectum that can itch or be painful.  You may develop swollen, bulging veins (varicose veins) in your legs.  You may have increased body aches in the pelvis, back, or thighs. This is due to weight gain and increased hormones that are relaxing your joints.  You may have changes in your hair. These can include thickening of your hair, rapid growth, and changes in texture. Some women also have hair loss during or after pregnancy, or hair that feels dry or thin. Your hair will most likely return to normal after your baby is born.  Your breasts will continue to grow and they will continue to become tender. A yellow fluid (colostrum) may leak from your breasts. This is the first milk you are producing for your baby.  Your belly button may stick out.  You may notice more swelling in your  hands, face, or ankles.  You may have increased tingling or numbness in your hands, arms, and legs. The skin on your belly may also feel numb.  You may feel short of breath because of your expanding uterus.  You may have more problems sleeping. This can be caused by the size of your belly, increased need to urinate, and an increase in your body's metabolism.  You may notice the fetus "dropping," or moving lower in your abdomen (lightening).  You may have increased vaginal discharge.  You may notice your joints feel loose and you may have pain around your pelvic bone.  What to expect at prenatal visits You will have prenatal exams every 2 weeks until week 36. Then you will have weekly prenatal exams. During a routine prenatal visit:  You will be weighed to make sure you and the baby are growing normally.  Your blood pressure will be taken.  Your abdomen will be measured to track your baby's growth.  The fetal heartbeat will be listened to.  Any test results from the previous visit will be discussed.  You may have a cervical check near your due date to see if your cervix has softened or thinned (effaced).  You will be tested for Group B streptococcus. This happens between 35 and 37 weeks.  Your health care provider may ask you:  What your birth plan is.  How you are feeling.  If you are feeling the baby move.  If you have  had any abnormal symptoms, such as leaking fluid, bleeding, severe headaches, or abdominal cramping.  If you are using any tobacco products, including cigarettes, chewing tobacco, and electronic cigarettes.  If you have any questions.  Other tests or screenings that may be performed during your third trimester include:  Blood tests that check for low iron levels (anemia).  Fetal testing to check the health, activity level, and growth of the fetus. Testing is done if you have certain medical conditions or if there are problems during the  pregnancy.  Nonstress test (NST). This test checks the health of your baby to make sure there are no signs of problems, such as the baby not getting enough oxygen. During this test, a belt is placed around your belly. The baby is made to move, and its heart rate is monitored during movement.  What is false labor? False labor is a condition in which you feel small, irregular tightenings of the muscles in the womb (contractions) that usually go away with rest, changing position, or drinking water. These are called Braxton Hicks contractions. Contractions may last for hours, days, or even weeks before true labor sets in. If contractions come at regular intervals, become more frequent, increase in intensity, or become painful, you should see your health care provider. What are the signs of labor?  Abdominal cramps.  Regular contractions that start at 10 minutes apart and become stronger and more frequent with time.  Contractions that start on the top of the uterus and spread down to the lower abdomen and back.  Increased pelvic pressure and dull back pain.  A watery or bloody mucus discharge that comes from the vagina.  Leaking of amniotic fluid. This is also known as your "water breaking." It could be a slow trickle or a gush. Let your health care provider know if it has a color or strange odor. If you have any of these signs, call your health care provider right away, even if it is before your due date. Follow these instructions at home: Medicines  Follow your health care provider's instructions regarding medicine use. Specific medicines may be either safe or unsafe to take during pregnancy.  Take a prenatal vitamin that contains at least 600 micrograms (mcg) of folic acid.  If you develop constipation, try taking a stool softener if your health care provider approves. Eating and drinking  Eat a balanced diet that includes fresh fruits and vegetables, whole grains, good sources of protein  such as meat, eggs, or tofu, and low-fat dairy. Your health care provider will help you determine the amount of weight gain that is right for you.  Avoid raw meat and uncooked cheese. These carry germs that can cause birth defects in the baby.  If you have low calcium intake from food, talk to your health care provider about whether you should take a daily calcium supplement.  Eat four or five small meals rather than three large meals a day.  Limit foods that are high in fat and processed sugars, such as fried and sweet foods.  To prevent constipation: ? Drink enough fluid to keep your urine clear or pale yellow. ? Eat foods that are high in fiber, such as fresh fruits and vegetables, whole grains, and beans. Activity  Exercise only as directed by your health care provider. Most women can continue their usual exercise routine during pregnancy. Try to exercise for 30 minutes at least 5 days a week. Stop exercising if you experience uterine contractions.  Avoid  heavy lifting.  Do not exercise in extreme heat or humidity, or at high altitudes.  Wear low-heel, comfortable shoes.  Practice good posture.  You may continue to have sex unless your health care provider tells you otherwise. Relieving pain and discomfort  Take frequent breaks and rest with your legs elevated if you have leg cramps or low back pain.  Take warm sitz baths to soothe any pain or discomfort caused by hemorrhoids. Use hemorrhoid cream if your health care provider approves.  Wear a good support bra to prevent discomfort from breast tenderness.  If you develop varicose veins: ? Wear support pantyhose or compression stockings as told by your healthcare provider. ? Elevate your feet for 15 minutes, 3-4 times a day. Prenatal care  Write down your questions. Take them to your prenatal visits.  Keep all your prenatal visits as told by your health care provider. This is important. Safety  Wear your seat belt at  all times when driving.  Make a list of emergency phone numbers, including numbers for family, friends, the hospital, and police and fire departments. General instructions  Avoid cat litter boxes and soil used by cats. These carry germs that can cause birth defects in the baby. If you have a cat, ask someone to clean the litter box for you.  Do not travel far distances unless it is absolutely necessary and only with the approval of your health care provider.  Do not use hot tubs, steam rooms, or saunas.  Do not drink alcohol.  Do not use any products that contain nicotine or tobacco, such as cigarettes and e-cigarettes. If you need help quitting, ask your health care provider.  Do not use any medicinal herbs or unprescribed drugs. These chemicals affect the formation and growth of the baby.  Do not douche or use tampons or scented sanitary pads.  Do not cross your legs for long periods of time.  To prepare for the arrival of your baby: ? Take prenatal classes to understand, practice, and ask questions about labor and delivery. ? Make a trial run to the hospital. ? Visit the hospital and tour the maternity area. ? Arrange for maternity or paternity leave through employers. ? Arrange for family and friends to take care of pets while you are in the hospital. ? Purchase a rear-facing car seat and make sure you know how to install it in your car. ? Pack your hospital bag. ? Prepare the baby's nursery. Make sure to remove all pillows and stuffed animals from the baby's crib to prevent suffocation.  Visit your dentist if you have not gone during your pregnancy. Use a soft toothbrush to brush your teeth and be gentle when you floss. Contact a health care provider if:  You are unsure if you are in labor or if your water has broken.  You become dizzy.  You have mild pelvic cramps, pelvic pressure, or nagging pain in your abdominal area.  You have lower back pain.  You have persistent  nausea, vomiting, or diarrhea.  You have an unusual or bad smelling vaginal discharge.  You have pain when you urinate. Get help right away if:  Your water breaks before 37 weeks.  You have regular contractions less than 5 minutes apart before 37 weeks.  You have a fever.  You are leaking fluid from your vagina.  You have spotting or bleeding from your vagina.  You have severe abdominal pain or cramping.  You have rapid weight loss or weight  gain.  You have shortness of breath with chest pain.  You notice sudden or extreme swelling of your face, hands, ankles, feet, or legs.  Your baby makes fewer than 10 movements in 2 hours.  You have severe headaches that do not go away when you take medicine.  You have vision changes. Summary  The third trimester is from week 28 through week 40, months 7 through 9. The third trimester is a time when the unborn baby (fetus) is growing rapidly.  During the third trimester, your discomfort may increase as you and your baby continue to gain weight. You may have abdominal, leg, and back pain, sleeping problems, and an increased need to urinate.  During the third trimester your breasts will keep growing and they will continue to become tender. A yellow fluid (colostrum) may leak from your breasts. This is the first milk you are producing for your baby.  False labor is a condition in which you feel small, irregular tightenings of the muscles in the womb (contractions) that eventually go away. These are called Braxton Hicks contractions. Contractions may last for hours, days, or even weeks before true labor sets in.  Signs of labor can include: abdominal cramps; regular contractions that start at 10 minutes apart and become stronger and more frequent with time; watery or bloody mucus discharge that comes from the vagina; increased pelvic pressure and dull back pain; and leaking of amniotic fluid. This information is not intended to replace advice  given to you by your health care provider. Make sure you discuss any questions you have with your health care provider. Document Released: 07/27/2001 Document Revised: 01/08/2016 Document Reviewed: 10/03/2012 [employer] Interactive Patient Education  2017 [place].   Breastfeeding Choosing to breastfeed is one of the best decisions you can make for yourself and your baby. A change in hormones during pregnancy causes your breasts to make breast milk in your milk-producing glands. Hormones prevent breast milk from being released before your baby is born. They also prompt milk flow after birth. Once breastfeeding has begun, thoughts of your baby, as well as his or her sucking or crying, can stimulate the release of milk from your milk-producing glands. Benefits of breastfeeding Research shows that breastfeeding offers many health benefits for infants and mothers. It also offers a cost-free and convenient way to feed your baby. For your baby  Your first milk (colostrum) helps your baby's digestive system to function better.  Special cells in your milk (antibodies) help your baby to fight off infections.  Breastfed babies are less likely to develop asthma, allergies, obesity, or type 2 diabetes. They are also at lower risk for sudden infant death syndrome (SIDS).  Nutrients in breast milk are better able to meet your baby's needs compared to infant formula.  Breast milk improves your baby's brain development. For you  Breastfeeding helps to create a very special bond between you and your baby.  Breastfeeding is convenient. Breast milk costs nothing and is always available at the correct temperature.  Breastfeeding helps to burn calories. It helps you to lose the weight that you gained during pregnancy.  Breastfeeding makes your uterus return faster to its size before pregnancy. It also slows bleeding (lochia) after you give birth.  Breastfeeding helps to lower your risk of developing type  2 diabetes, osteoporosis, rheumatoid arthritis, cardiovascular disease, and breast, ovarian, uterine, and endometrial cancer later in life. Breastfeeding basics Starting breastfeeding  Find a comfortable place to sit or lie down, with your  neck and back well-supported.  Place a pillow or a rolled-up blanket under your baby to bring him or her to the level of your breast (if you are seated). Nursing pillows are specially designed to help support your arms and your baby while you breastfeed.  Make sure that your baby's tummy (abdomen) is facing your abdomen.  Gently massage your breast. With your fingertips, massage from the outer edges of your breast inward toward the nipple. This encourages milk flow. If your milk flows slowly, you may need to continue this action during the feeding.  Support your breast with 4 fingers underneath and your thumb above your nipple (make the letter "C" with your hand). Make sure your fingers are well away from your nipple and your baby's mouth.  Stroke your baby's lips gently with your finger or nipple.  When your baby's mouth is open wide enough, quickly bring your baby to your breast, placing your entire nipple and as much of the areola as possible into your baby's mouth. The areola is the colored area around your nipple. ? More areola should be visible above your baby's upper lip than below the lower lip. ? Your baby's lips should be opened and extended outward (flanged) to ensure an adequate, comfortable latch. ? Your baby's tongue should be between his or her lower gum and your breast.  Make sure that your baby's mouth is correctly positioned around your nipple (latched). Your baby's lips should create a seal on your breast and be turned out (everted).  It is common for your baby to suck about 2-3 minutes in order to start the flow of breast milk. Latching Teaching your baby how to latch onto your breast properly is very important. An improper latch can  cause nipple pain, decreased milk supply, and poor weight gain in your baby. Also, if your baby is not latched onto your nipple properly, he or she may swallow some air during feeding. This can make your baby fussy. Burping your baby when you switch breasts during the feeding can help to get rid of the air. However, teaching your baby to latch on properly is still the best way to prevent fussiness from swallowing air while breastfeeding. Signs that your baby has successfully latched onto your nipple  Silent tugging or silent sucking, without causing you pain. Infant's lips should be extended outward (flanged).  Swallowing heard between every 3-4 sucks once your milk has started to flow (after your let-down milk reflex occurs).  Muscle movement above and in front of his or her ears while sucking.  Signs that your baby has not successfully latched onto your nipple  Sucking sounds or smacking sounds from your baby while breastfeeding.  Nipple pain.  If you think your baby has not latched on correctly, slip your finger into the corner of your baby's mouth to break the suction and place it between your baby's gums. Attempt to start breastfeeding again. Signs of successful breastfeeding Signs from your baby  Your baby will gradually decrease the number of sucks or will completely stop sucking.  Your baby will fall asleep.  Your baby's body will relax.  Your baby will retain a small amount of milk in his or her mouth.  Your baby will let go of your breast by himself or herself.  Signs from you  Breasts that have increased in firmness, weight, and size 1-3 hours after feeding.  Breasts that are softer immediately after breastfeeding.  Increased milk volume, as well as a  change in milk consistency and color by the fifth day of breastfeeding.  Nipples that are not sore, cracked, or bleeding.  Signs that your baby is getting enough milk  Wetting at least 1-2 diapers during the first 24  hours after birth.  Wetting at least 5-6 diapers every 24 hours for the first week after birth. The urine should be clear or pale yellow by the age of 5 days.  Wetting 6-8 diapers every 24 hours as your baby continues to grow and develop.  At least 3 stools in a 24-hour period by the age of 5 days. The stool should be soft and yellow.  At least 3 stools in a 24-hour period by the age of 7 days. The stool should be seedy and yellow.  No loss of weight greater than 10% of birth weight during the first 3 days of life.  Average weight gain of 4-7 oz (113-198 g) per week after the age of 4 days.  Consistent daily weight gain by the age of 5 days, without weight loss after the age of 2 weeks. After a feeding, your baby may spit up a small amount of milk. This is normal. Breastfeeding frequency and duration Frequent feeding will help you make more milk and can prevent sore nipples and extremely full breasts (breast engorgement). Breastfeed when you feel the need to reduce the fullness of your breasts or when your baby shows signs of hunger. This is called "breastfeeding on demand." Signs that your baby is hungry include:  Increased alertness, activity, or restlessness.  Movement of the head from side to side.  Opening of the mouth when the corner of the mouth or cheek is stroked (rooting).  Increased sucking sounds, smacking lips, cooing, sighing, or squeaking.  Hand-to-mouth movements and sucking on fingers or hands.  Fussing or crying.  Avoid introducing a pacifier to your baby in the first 4-6 weeks after your baby is born. After this time, you may choose to use a pacifier. Research has shown that pacifier use during the first year of a baby's life decreases the risk of sudden infant death syndrome (SIDS). Allow your baby to feed on each breast as long as he or she wants. When your baby unlatches or falls asleep while feeding from the first breast, offer the second breast. Because  newborns are often sleepy in the first few weeks of life, you may need to awaken your baby to get him or her to feed. Breastfeeding times will vary from baby to baby. However, the following rules can serve as a guide to help you make sure that your baby is properly fed:  Newborns (babies 70 weeks of age or younger) may breastfeed every 1-3 hours.  Newborns should not go without breastfeeding for longer than 3 hours during the day or 5 hours during the night.  You should breastfeed your baby a minimum of 8 times in a 24-hour period.  Breast milk pumping Pumping and storing breast milk allows you to make sure that your baby is exclusively fed your breast milk, even at times when you are unable to breastfeed. This is especially important if you go back to work while you are still breastfeeding, or if you are not able to be present during feedings. Your lactation consultant can help you find a method of pumping that works best for you and give you guidelines about how long it is safe to store breast milk. Caring for your breasts while you breastfeed Nipples can become  dry, cracked, and sore while breastfeeding. The following recommendations can help keep your breasts moisturized and healthy:  Avoid using soap on your nipples.  Wear a supportive bra designed especially for nursing. Avoid wearing underwire-style bras or extremely tight bras (sports bras).  Air-dry your nipples for 3-4 minutes after each feeding.  Use only cotton bra pads to absorb leaked breast milk. Leaking of breast milk between feedings is normal.  Use lanolin on your nipples after breastfeeding. Lanolin helps to maintain your skin's normal moisture barrier. Pure lanolin is not harmful (not toxic) to your baby. You may also hand express a few drops of breast milk and gently massage that milk into your nipples and allow the milk to air-dry.  In the first few weeks after giving birth, some women experience breast engorgement.  Engorgement can make your breasts feel heavy, warm, and tender to the touch. Engorgement peaks within 3-5 days after you give birth. The following recommendations can help to ease engorgement:  Completely empty your breasts while breastfeeding or pumping. You may want to start by applying warm, moist heat (in the shower or with warm, water-soaked hand towels) just before feeding or pumping. This increases circulation and helps the milk flow. If your baby does not completely empty your breasts while breastfeeding, pump any extra milk after he or she is finished.  Apply ice packs to your breasts immediately after breastfeeding or pumping, unless this is too uncomfortable for you. To do this: ? Put ice in a plastic bag. ? Place a towel between your skin and the bag. ? Leave the ice on for 20 minutes, 2-3 times a day.  Make sure that your baby is latched on and positioned properly while breastfeeding.  If engorgement persists after 48 hours of following these recommendations, contact your health care provider or a Science writer. Overall health care recommendations while breastfeeding  Eat 3 healthy meals and 3 snacks every day. Well-nourished mothers who are breastfeeding need an additional 450-500 calories a day. You can meet this requirement by increasing the amount of a balanced diet that you eat.  Drink enough water to keep your urine pale yellow or clear.  Rest often, relax, and continue to take your prenatal vitamins to prevent fatigue, stress, and low vitamin and mineral levels in your body (nutrient deficiencies).  Do not use any products that contain nicotine or tobacco, such as cigarettes and e-cigarettes. Your baby may be harmed by chemicals from cigarettes that pass into breast milk and exposure to secondhand smoke. If you need help quitting, ask your health care provider.  Avoid alcohol.  Do not use illegal drugs or marijuana.  Talk with your health care provider before  taking any medicines. These include over-the-counter and prescription medicines as well as vitamins and herbal supplements. Some medicines that may be harmful to your baby can pass through breast milk.  It is possible to become pregnant while breastfeeding. If birth control is desired, ask your health care provider about options that will be safe while breastfeeding your baby. Where to find more information: [employer]: www.llli.org Contact a health care provider if:  You feel like you want to stop breastfeeding or have become frustrated with breastfeeding.  Your nipples are cracked or bleeding.  Your breasts are red, tender, or warm.  You have: ? Painful breasts or nipples. ? A swollen area on either breast. ? A fever or chills. ? Nausea or vomiting. ? Drainage other than breast milk from your  nipples.  Your breasts do not become full before feedings by the fifth day after you give birth.  You feel sad and depressed.  Your baby is: ? Too sleepy to eat well. ? Having trouble sleeping. ? More than 49 week old and wetting fewer than 6 diapers in a 24-hour period. ? Not gaining weight by 55 days of age.  Your baby has fewer than 3 stools in a 24-hour period.  Your baby's skin or the white parts of his or her eyes become yellow. Get help right away if:  Your baby is overly tired (lethargic) and does not want to wake up and feed.  Your baby develops an unexplained fever. Summary  Breastfeeding offers many health benefits for infant and mothers.  Try to breastfeed your infant when he or she shows early signs of hunger.  Gently tickle or stroke your baby's lips with your finger or nipple to allow the baby to open his or her mouth. Bring the baby to your breast. Make sure that much of the areola is in your baby's mouth. Offer one side and burp the baby before you offer the other side.  Talk with your health care provider or lactation consultant if you have  questions or you face problems as you breastfeed. This information is not intended to replace advice given to you by your health care provider. Make sure you discuss any questions you have with your health care provider. Document Released: 08/02/2005 Document Revised: 09/03/2016 Document Reviewed: 09/03/2016 Elsevier Interactive Patient Education  2018 [place]? Guide for patients at [facility] [employer]  Why consider waterbirth?  . Gentle birth for babies . Less pain medicine used in labor . May allow for passive descent/less pushing . May reduce perineal tears  . More mobility and instinctive maternal position changes . Increased maternal relaxation . Reduced blood pressure in labor  Is waterbirth safe? What are the risks of infection, drowning or other complications?  . Infection: o Very low risk (3.7 % for tub vs 4.8% for bed) o 7 in 8000 waterbirths with documented infection o Poorly cleaned equipment most common cause o Slightly lower group B strep transmission rate  . Drowning o Maternal:  - Very low risk   - Related to seizures or fainting o Newborn:  - Very low risk. No evidence of increased risk of respiratory problems in multiple large studies - Physiological protection from breathing under water - Avoid underwater birth if there are any fetal complications - Once baby's head is out of the water, keep it out.  . Birth complication o Some reports of cord trauma, but risk decreased by bringing baby to surface gradually o No evidence of increased risk of shoulder dystocia. Mothers can usually change positions faster in water than in a bed, possibly aiding the maneuvers to free the shoulder.   You must attend a Doren Custard class at [facility]  3rd Wednesday of every month from 7-9pm  [employer] by calling [phone] or online at [email or web address]  Bring [place] the certificate from the class to your prenatal  appointment  Meet with a midwife at 36 weeks to see if you can still plan a waterbirth and to sign the consent.   Purchase or rent the following supplies:   Water Birth Pool (Birth Pool in a Box or [place] for instance)  (Tubs start ~$125)  Single-use disposable tub liner designed for your brand of tub  New garden hose labeled "lead-free", "suitable for  drinking water",  Electric drain pump to remove water (We recommend 792 gallon per hour or greater pump.)   Separate garden hose to remove the dirty water  Fish net  Bathing suit top (optional)  Long-handled mirror (optional)  Places to purchase or rent supplies  GotWebTools.is for tub purchases and supplies  Waterbirthsolutions.com for tub purchases and supplies  The Labor Ladies (www.thelaborladies.com) $275 for tub rental/set-up & take down/kit   [employer] (http://www.fleming.com/.htm) Information regarding doulas (labor support) who provide pool rentals  Our practice has a Birth Pool in a Box tub at the hospital that you may borrow on a first-come-first-served basis. It is your responsibility to to set up, clean and break down the tub. We cannot guarantee the availability of this tub in advance. You are responsible for bringing all accessories listed above. If you do not have all necessary supplies you cannot have a waterbirth.    Things that would prevent you from having a waterbirth:  Premature, <37wks  Previous cesarean birth  Presence of thick meconium-stained fluid  Multiple gestation (Twins, triplets, etc.)  Uncontrolled diabetes or gestational diabetes requiring medication  Hypertension requiring medication or diagnosis of pre-eclampsia  Heavy vaginal bleeding  Non-reassuring fetal heart rate  Active infection (MRSA, etc.). Group B Strep is NOT a contraindication for  waterbirth.  If your labor has to be induced and induction method requires continuous  monitoring of the  baby's heart rate  Other risks/issues identified by your obstetrical provider  Please remember that birth is unpredictable. Under certain unforeseeable circumstances your provider may advise against giving birth in the tub. These decisions will be made on a case-by-case basis and with the safety of you and your baby as our highest priority.

## 2017-12-24 LAB — RPR: RPR: NONREACTIVE

## 2017-12-24 LAB — CBC
HEMOGLOBIN: 9 g/dL — AB (ref 11.1–15.9)
Hematocrit: 27.8 % — ABNORMAL LOW (ref 34.0–46.6)
MCH: 27.2 pg (ref 26.6–33.0)
MCHC: 32.4 g/dL (ref 31.5–35.7)
MCV: 84 fL (ref 79–97)
Platelets: 217 10*3/uL (ref 150–379)
RBC: 3.31 x10E6/uL — ABNORMAL LOW (ref 3.77–5.28)
RDW: 14.8 % (ref 12.3–15.4)
WBC: 8.3 10*3/uL (ref 3.4–10.8)

## 2017-12-24 LAB — GLUCOSE TOLERANCE, 2 HOURS W/ 1HR
GLUCOSE, 1 HOUR: 86 mg/dL (ref 65–179)
GLUCOSE, 2 HOUR: 91 mg/dL (ref 65–152)
GLUCOSE, FASTING: 75 mg/dL (ref 65–91)

## 2017-12-24 LAB — HIV ANTIBODY (ROUTINE TESTING W REFLEX): HIV SCREEN 4TH GENERATION: NONREACTIVE

## 2017-12-24 NOTE — Progress Notes (Signed)
   PRENATAL VISIT NOTE  Subjective:  Deanna Sullivan is a 22 y.o. G2P1001 at [redacted]w[redacted]d being seen today for ongoing prenatal care.  She is currently monitored for the following issues for this low-risk pregnancy and has Supervision of other normal pregnancy, antepartum; Retained products of conception after delivery with complications; History of postpartum hemorrhage, currently pregnant; Chlamydia infection, current pregnancy; and History of maternal cardiomyopathy, currently pregnant on their problem list.  Patient reports no complaints.  Contractions: Irritability. Vag. Bleeding: None.  Movement: Present. Denies leaking of fluid.   The following portions of the patient's history were reviewed and updated as appropriate: allergies, current medications, past family history, past medical history, past social history, past surgical history and problem list. Problem list updated.  Objective:   Vitals:   12/23/17 0855  BP: 107/61  Pulse: 96  Weight: 136 lb (61.7 kg)    Fetal Status: Fetal Heart Rate (bpm): 133 Fundal Height: 30 cm Movement: Present  Presentation: Vertex  General:  Alert, oriented and cooperative. Patient is in no acute distress.  Skin: Skin is warm and dry. No rash noted.   Cardiovascular: Normal heart rate noted  Respiratory: Normal respiratory effort, no problems with respiration noted  Abdomen: Soft, gravid, appropriate for gestational age.  Pain/Pressure: Present     Pelvic: Cervical exam deferred        Extremities: Normal range of motion.  Edema: None  Mental Status: Normal mood and affect. Normal behavior. Normal judgment and thought content.   Assessment and Plan:  Pregnancy: G2P1001 at [redacted]w[redacted]d  1. Supervision of other normal pregnancy, antepartum Missed 28 wk labs--will get today - HIV antibody - Glucose Tolerance, 2 Hours w/1 Hour - CBC - RPR  2. History of postpartum hemorrhage, currently pregnant   3. History of maternal cardiomyopathy, currently  pregnant Resolved on last ECHO--EF 55-60 %  Term labor symptoms and general obstetric precautions including but not limited to vaginal bleeding, contractions, leaking of fluid and fetal movement were reviewed in detail with the patient. Please refer to After Visit Summary for other counseling recommendations.  Return in 1 week (on 12/30/2017) for see midwife, considering water birth.  Future Appointments  Date Time Provider Department Center  12/26/2017  3:15 PM Beatrice Lecher, PA-C [facility]  12/29/2017  8:55 AM Reva Bores, MD [facility]  01/06/2018  8:15 AM Adam Phenix, MD [facility]    Reva Bores, MD

## 2017-12-26 ENCOUNTER — Ambulatory Visit: Payer: Medicaid Other | Admitting: Physician Assistant

## 2017-12-28 ENCOUNTER — Encounter: Payer: Self-pay | Admitting: Physician Assistant

## 2017-12-29 ENCOUNTER — Encounter: Payer: Self-pay | Admitting: Family Medicine

## 2017-12-29 ENCOUNTER — Encounter: Payer: Medicaid Other | Admitting: Family Medicine

## 2017-12-29 ENCOUNTER — Telehealth: Payer: Self-pay | Admitting: General Practice

## 2017-12-29 NOTE — Telephone Encounter (Signed)
Patient missed appointment this morning with Dr. Shawnie Pons.  Called patient to remind her of appointment that is scheduled on 01/06/18 at 8:15am.  Advised patient if she had any complications to visit MAU.  Patient voiced understanding.

## 2017-12-29 NOTE — Progress Notes (Signed)
Patient did not keep appointment today. She will be called to reschedule.  

## 2018-01-02 ENCOUNTER — Inpatient Hospital Stay (HOSPITAL_COMMUNITY)
Admission: AD | Admit: 2018-01-02 | Discharge: 2018-01-04 | DRG: 807 | Disposition: A | Discharge status: Home / Self Care | Payer: Medicaid Other | Source: Ambulatory Visit | Attending: Obstetrics & Gynecology | Admitting: Obstetrics & Gynecology

## 2018-01-02 ENCOUNTER — Other Ambulatory Visit: Payer: Self-pay

## 2018-01-02 ENCOUNTER — Encounter (HOSPITAL_COMMUNITY): Payer: Self-pay

## 2018-01-02 DIAGNOSIS — Z87891 Personal history of nicotine dependence: Secondary | ICD-10-CM | POA: Diagnosis not present

## 2018-01-02 DIAGNOSIS — Z3A40 40 weeks gestation of pregnancy: Secondary | ICD-10-CM

## 2018-01-02 DIAGNOSIS — Z8679 Personal history of other diseases of the circulatory system: Secondary | ICD-10-CM

## 2018-01-02 DIAGNOSIS — O09899 Supervision of other high risk pregnancies, unspecified trimester: Secondary | ICD-10-CM

## 2018-01-02 DIAGNOSIS — Z3483 Encounter for supervision of other normal pregnancy, third trimester: Secondary | ICD-10-CM | POA: Diagnosis present

## 2018-01-02 LAB — CBC
HCT: 30.1 % — ABNORMAL LOW (ref 36.0–46.0)
Hemoglobin: 9.6 g/dL — ABNORMAL LOW (ref 12.0–15.0)
MCH: 28 pg (ref 26.0–34.0)
MCHC: 31.9 g/dL (ref 30.0–36.0)
MCV: 87.8 fL (ref 78.0–100.0)
Platelets: 254 10*3/uL (ref 150–400)
RBC: 3.43 MIL/uL — ABNORMAL LOW (ref 3.87–5.11)
RDW: 14.8 % (ref 11.5–15.5)
WBC: 8.7 10*3/uL (ref 4.0–10.5)

## 2018-01-02 LAB — RPR: RPR Ser Ql: NONREACTIVE

## 2018-01-02 MED ORDER — TETANUS-DIPHTH-ACELL PERTUSSIS 5-2.5-18.5 LF-MCG/0.5 IM SUSP: 0.5000 mL | Freq: Once | INTRAMUSCULAR | Status: DC

## 2018-01-02 MED ORDER — OXYCODONE HCL 5 MG PO TABS
5.0000 mg | ORAL_TABLET | Freq: Once | ORAL | Status: AC
  Administered 2018-01-03: 5 mg via ORAL
  Filled 2018-01-02: qty 1

## 2018-01-02 MED ORDER — SENNOSIDES-DOCUSATE SODIUM 8.6-50 MG PO TABS
2.0000 | ORAL_TABLET | ORAL | Status: DC
  Administered 2018-01-02: 2 via ORAL
  Filled 2018-01-02 (×2): qty 2

## 2018-01-02 MED ORDER — COCONUT OIL OIL
1.0000 "application " | TOPICAL_OIL | Status: DC | PRN
  Filled 2018-01-02: qty 120

## 2018-01-02 MED ORDER — FENTANYL 2.5 MCG/ML BUPIVACAINE 1/10 % EPIDURAL INFUSION (WH - ANES)
14.0000 mL/h | INTRAMUSCULAR | Status: DC | PRN
  Filled 2018-01-02: qty 100

## 2018-01-02 MED ORDER — OXYCODONE-ACETAMINOPHEN 5-325 MG PO TABS
2.0000 | ORAL_TABLET | ORAL | Status: DC | PRN
  Administered 2018-01-02: 2 via ORAL
  Filled 2018-01-02: qty 2

## 2018-01-02 MED ORDER — ZOLPIDEM TARTRATE 5 MG PO TABS: 5.0000 mg | ORAL_TABLET | Freq: Every evening | ORAL | Status: DC | PRN

## 2018-01-02 MED ORDER — LACTATED RINGERS IV SOLN: 500.0000 mL | Freq: Once | INTRAVENOUS | Status: DC

## 2018-01-02 MED ORDER — LACTATED RINGERS IV SOLN: 500.0000 mL | INTRAVENOUS | Status: DC | PRN

## 2018-01-02 MED ORDER — OXYTOCIN BOLUS FROM INFUSION
500.0000 mL | Freq: Once | INTRAVENOUS | Status: AC
  Administered 2018-01-02: 500 mL via INTRAVENOUS

## 2018-01-02 MED ORDER — TRAMADOL HCL 50 MG PO TABS
50.0000 mg | ORAL_TABLET | Freq: Four times a day (QID) | ORAL | Status: DC | PRN
  Administered 2018-01-02: 50 mg via ORAL
  Filled 2018-01-02: qty 1

## 2018-01-02 MED ORDER — PHENYLEPHRINE 40 MCG/ML (10ML) SYRINGE FOR IV PUSH (FOR BLOOD PRESSURE SUPPORT)
80.0000 ug | PREFILLED_SYRINGE | INTRAVENOUS | Status: DC | PRN
Start: 2018-01-02 — End: 2018-01-02
  Filled 2018-01-02: qty 5
  Filled 2018-01-02: qty 10

## 2018-01-02 MED ORDER — SOD CITRATE-CITRIC ACID 500-334 MG/5ML PO SOLN: 30.0000 mL | ORAL | Status: DC | PRN

## 2018-01-02 MED ORDER — ONDANSETRON HCL 4 MG/2ML IJ SOLN: 4.0000 mg | Freq: Four times a day (QID) | INTRAMUSCULAR | Status: DC | PRN

## 2018-01-02 MED ORDER — LACTATED RINGERS IV SOLN: INTRAVENOUS | Status: DC

## 2018-01-02 MED ORDER — EPHEDRINE 5 MG/ML INJ
10.0000 mg | INTRAVENOUS | Status: DC | PRN
Start: 2018-01-02 — End: 2018-01-02
  Filled 2018-01-02: qty 2

## 2018-01-02 MED ORDER — PRENATAL MULTIVITAMIN CH
1.0000 | ORAL_TABLET | Freq: Every day | ORAL | Status: DC
  Administered 2018-01-04: 1 via ORAL
  Filled 2018-01-02 (×3): qty 1

## 2018-01-02 MED ORDER — ONDANSETRON HCL 4 MG PO TABS: 4.0000 mg | ORAL_TABLET | ORAL | Status: DC | PRN

## 2018-01-02 MED ORDER — WITCH HAZEL-GLYCERIN EX PADS: 1.0000 "application " | MEDICATED_PAD | CUTANEOUS | Status: DC | PRN

## 2018-01-02 MED ORDER — OXYTOCIN 40 UNITS IN LACTATED RINGERS INFUSION - SIMPLE MED
2.5000 [IU]/h | INTRAVENOUS | Status: DC
  Filled 2018-01-02: qty 1000

## 2018-01-02 MED ORDER — OXYCODONE-ACETAMINOPHEN 5-325 MG PO TABS
1.0000 | ORAL_TABLET | ORAL | Status: DC | PRN
Start: 2018-01-02 — End: 2018-01-02

## 2018-01-02 MED ORDER — PHENYLEPHRINE 40 MCG/ML (10ML) SYRINGE FOR IV PUSH (FOR BLOOD PRESSURE SUPPORT)
80.0000 ug | PREFILLED_SYRINGE | INTRAVENOUS | Status: DC | PRN
  Filled 2018-01-02: qty 5

## 2018-01-02 MED ORDER — SIMETHICONE 80 MG PO CHEW: 80.0000 mg | CHEWABLE_TABLET | ORAL | Status: DC | PRN

## 2018-01-02 MED ORDER — LIDOCAINE HCL (PF) 1 % IJ SOLN
30.0000 mL | INTRAMUSCULAR | Status: DC | PRN
  Filled 2018-01-02: qty 30

## 2018-01-02 MED ORDER — IBUPROFEN 600 MG PO TABS
600.0000 mg | ORAL_TABLET | Freq: Four times a day (QID) | ORAL | Status: DC
  Administered 2018-01-02 – 2018-01-04 (×9): 600 mg via ORAL
  Filled 2018-01-02 (×8): qty 1

## 2018-01-02 MED ORDER — DIPHENHYDRAMINE HCL 50 MG/ML IJ SOLN: 12.5000 mg | INTRAMUSCULAR | Status: DC | PRN

## 2018-01-02 MED ORDER — DIPHENHYDRAMINE HCL 25 MG PO CAPS: 25.0000 mg | ORAL_CAPSULE | Freq: Four times a day (QID) | ORAL | Status: DC | PRN

## 2018-01-02 MED ORDER — ONDANSETRON HCL 4 MG/2ML IJ SOLN: 4.0000 mg | INTRAMUSCULAR | Status: DC | PRN

## 2018-01-02 MED ORDER — DIBUCAINE 1 % RE OINT
1.0000 "application " | TOPICAL_OINTMENT | RECTAL | Status: DC | PRN
  Filled 2018-01-02: qty 28

## 2018-01-02 MED ORDER — ACETAMINOPHEN 325 MG PO TABS: 650.0000 mg | ORAL_TABLET | ORAL | Status: DC | PRN

## 2018-01-02 MED ORDER — BENZOCAINE-MENTHOL 20-0.5 % EX AERO
1.0000 "application " | INHALATION_SPRAY | CUTANEOUS | Status: DC | PRN
  Administered 2018-01-02: 1 via TOPICAL
  Filled 2018-01-02 (×2): qty 56

## 2018-01-02 MED ORDER — EPHEDRINE 5 MG/ML INJ
10.0000 mg | INTRAVENOUS | Status: DC | PRN
  Filled 2018-01-02: qty 2

## 2018-01-02 MED ORDER — ACETAMINOPHEN 325 MG PO TABS
650.0000 mg | ORAL_TABLET | ORAL | Status: DC | PRN
  Administered 2018-01-03 – 2018-01-04 (×2): 650 mg via ORAL
  Filled 2018-01-02 (×2): qty 2

## 2018-01-02 NOTE — Lactation Note (Signed)
This note was copied from a baby's chart. Lactation Consultation Note  Patient Name: Deanna Sullivan [id number] Date: 01/02/2018 Reason for consult: Initial assessment;Term  P2 mother whose infant is now 29 hours old.  Mother breastfed her 22 year old.  I noticed that mother's preference was to breast/bottle feed baby, however, she has not put baby to breast since delivery.  When questioned about her plan mother stated, "I don't have any milk."  I reviewed colostrum, CTV,  tummy size, breast stimulation for obtaining and maintaining milk supply, STS, breast massage and hand expression.    I offered to help with latch and mother agreed.  Mother's breasts are soft and non tender and nipples are erect with no breakdown.  Latched baby onto the left breast in the football hold but he made no attempt to suck.  He fell asleep at mother's breast.  I suggested mother hold STS and watch for feeding cues and attempt to put him to breast next time he shows cues.  I also suggested always putting baby to breast first before any supplementation is given.  Mother can limit supplementation to 10 mls. After breastfeeding.    Mom made aware of O/P services, breastfeeding support groups, community resources, and our phone # for post-discharge questions. Mother will call for assistance as needed.   Maternal Data Formula Feeding for Exclusion: No Has patient been taught Hand Expression?: Yes Does the patient have breastfeeding experience prior to this delivery?: Yes  Feeding Feeding Type: Breast Fed Length of feed: 0 min  LATCH Score Latch: Too sleepy or reluctant, no latch achieved, no sucking elicited.  Audible Swallowing: None  Type of Nipple: Everted at rest and after stimulation  Comfort (Breast/Nipple): Soft / non-tender  Hold (Positioning): Assistance needed to correctly position infant at breast and maintain latch.  LATCH Score: 5  Interventions Interventions: Breast feeding basics  reviewed;Assisted with latch;Skin to skin;Breast massage;Support pillows;Adjust position;Breast compression  Lactation Tools Discussed/Used     Consult Status Consult Status: Follow-up Date: 01/03/18 Follow-up type: In-patient    [name] 01/02/2018, 10:58 PM

## 2018-01-02 NOTE — MAU Note (Signed)
Pt reports contractions every 5 mins since 5am. Pt denies LOF or vaginal bleeding. Reports good fetal movement. Cervix was 2cm on 12/23/2017

## 2018-01-02 NOTE — H&P (Signed)
LABOR AND DELIVERY ADMISSION HISTORY AND PHYSICAL NOTE  Deanna Sullivan is a 22 y.o. female G7P2002 with IUP at [redacted]w[redacted]d by LMP c/w 8 week [place] presenting for SOL.  She reports positive fetal movement. She denies leakage of fluid or vaginal bleeding.  Prenatal History/Complications: PNC at [facility] Pregnancy complications:  - Retained products of conception after delivery with complications with last pregnancy  - History of PPH  - Chlamydia infection, negative 12/12/17 - History of maternal cardiomyopathy and severe sepsis   Past Medical History: Past Medical History:  Diagnosis Date  . [redacted] weeks gestation of pregnancy   . Acute pyelonephritis   . Acute renal failure syndrome (HCC)   . Acute respiratory failure with hypoxia (HCC)   . Anemia   . Aortic dissection (HCC)   . Back pain   . Chlamydia   . E coli bacteremia   . Encounter for fetal anatomic survey   . Fetal size inconsistent with dates   . Fever and chills   . Hx of postpartum hemorrhage, currently pregnant   . Hypokalemia   . Hypomagnesemia   . IUGR, antenatal   . Leucocytosis   . Metabolic acidosis   . Normocytic anemia   . NSVD (normal spontaneous vaginal delivery)   . Peripartum cardiomyopathy   . Poor fetal growth, affecting management of mother, third trimester, not applicable or unspecified fetus   . Postpartum endometritis   . Pyelonephritis   . Retained placenta with hemorrhage, postpartum condition   . Shock, septic (HCC)   . Tachycardia, unspecified     Past Surgical History: Past Surgical History:  Procedure Laterality Date  . DILATION AND EVACUATION N/A 04/22/2015   Procedure: DILATATION AND EVACUATION;  Surgeon: Adam Phenix, MD;  Location: [facility];  Service: Gynecology;  Laterality: N/A;    Obstetrical History: OB History    Gravida  2   Para  2   Term  2   Preterm      AB      Living  2     SAB      TAB      Ectopic      Multiple  0   Live Births  2           Social  History: Social History   Socioeconomic History  . Marital status: Single    Spouse name: Not on file  . Number of children: Not on file  . Years of education: Not on file  . Highest education level: Not on file  Occupational History  . Not on file  Social Needs  . Financial resource strain: Not on file  . Food insecurity:    Worry: Not on file    Inability: Not on file  . Transportation needs:    Medical: Not on file    Non-medical: Not on file  Tobacco Use  . Smoking status: Former Smoker    Packs/day: 0.50    Types: Cigarettes    Last attempt to quit: 04/18/2017    Years since quitting: 0.7  . Smokeless tobacco: Never Used  Substance and Sexual Activity  . Alcohol use: No    Comment: occassionally  . Drug use: No  . Sexual activity: Yes    Birth control/protection: None  Lifestyle  . Physical activity:    Days per week: Not on file    Minutes per session: Not on file  . Stress: Not on file  Relationships  . Social connections:  Talks on phone: Not on file    Gets together: Not on file    Attends religious service: Not on file    Active member of club or organization: Not on file    Attends meetings of clubs or organizations: Not on file    Relationship status: Not on file  Other Topics Concern  . Not on file  Social History Narrative  . Not on file    Family History: Family History  Problem Relation Age of Onset  . Cancer Mother     Allergies: No Known Allergies  Medications Prior to Admission  Medication Sig Dispense Refill Last Dose  . Prenatal Vit-Fe Fumarate-FA (MULTIVITAMIN-PRENATAL) 27-0.8 MG TABS tablet Take 1 tablet by mouth daily at 12 noon.   01/01/2018 at Unknown time     Review of Systems  All systems reviewed and negative except as stated in HPI  Physical Exam Blood pressure 116/72, pulse 83, temperature 98.2 F (36.8 C), temperature source Oral, resp. rate 20, height 5\' 3"  (1.6 m), weight 146 lb (66.2 kg), last menstrual period  03/28/2017, SpO2 100 %, unknown if currently breastfeeding. General appearance: alert, cooperative and no distress Lungs: clear to auscultation bilaterally Heart: regular rate and rhythm Abdomen: soft, non-tender; bowel sounds normal Extremities: No calf swelling or tenderness Presentation: cephalic Fetal monitoring: 135/ moderate/ +accels/ no decels  Uterine activity: 3-4/ moderate by palpation  Dilation: 6 Effacement (%): 100 Station: Plus 1 Exam by:: Camelia Eng RN   Prenatal labs: ABO, Rh: --/--/O POS (05/20 6962) Antibody: PENDING (05/20 0655) Rubella: 3.68 (12/18 0944) RPR: Non Reactive (05/10 0908)  HBsAg: Negative (12/18 0944)  HIV: Non Reactive (05/10 0908)  GC/Chlamydia: Negative (12/12/17) GBS: Negative (04/29 0000)  2 hr Glucola: 75-86-91 Genetic screening:  negative Anatomy US: normal   Clinic [facility] Prenatal Labs  Dating LMP c/w 8 week [place] Blood type: O/Positive/-- (12/18 0944)   Genetic Screen  AFP/Quad: normal    Antibody:Negative (12/18 0944)  Anatomic US wnl but incomplete at 20 weeks; F/U at 25-26 weeks needed Rubella: 3.68 (12/18 0944)  GTT Early:  N/A   Third trimester: 76/86/91 RPR: Non Reactive (12/18 0944)   Flu vaccine 08/02/17 HBsAg: Negative (12/18 0944)   TDaP vaccine 11/25/17                                 HIV: Non Reactive (10/10 2315)  Baby Food    Formula                                           GBS: (For PCN allergy, check sensitivities)  Contraception IUD, discussed on 12/18 Pap:WNL 08/02/17  Circumcision Yes, outpatient   [facility]? CF:  Support Person Merita Norton (aunt) SMA: 2 copies  Prenatal Classes  Hgb electrophoresis: WNL    Prenatal Transfer Tool  Maternal Diabetes: No Genetic Screening: Normal Maternal Ultrasounds/Referrals: Normal Fetal Ultrasounds or other Referrals:  None Maternal Substance Abuse:  No Significant Maternal Medications:  None Significant Maternal Lab Results: Lab values include:  Group B Strep negative  Results for orders placed or performed during the hospital encounter of 01/02/18 (from the past 24 hour(s))  CBC   Collection Time: 01/02/18  6:55 AM  Result Value Ref Range   WBC 8.7 4.0 - 10.5 K/uL  RBC 3.43 (L) 3.87 - 5.11 MIL/uL   Hemoglobin 9.6 (L) 12.0 - 15.0 g/dL   HCT 96.2 (L) 95.2 - 84.1 %   MCV 87.8 78.0 - 100.0 fL   MCH 28.0 26.0 - 34.0 pg   MCHC 31.9 30.0 - 36.0 g/dL   RDW 32.4 40.1 - 02.7 %   Platelets 254 150 - 400 K/uL  Type and screen [facility]   Collection Time: 01/02/18  6:55 AM  Result Value Ref Range   ABO/RH(D) O POS    Antibody Screen PENDING    Sample Expiration      01/05/2018 Performed at [facility], [address], [place], [place] [address]     Patient Active Problem List   Diagnosis Date Noted  . Normal labor 01/02/2018  . SVD (spontaneous vaginal delivery) 01/02/2018  . History of maternal cardiomyopathy, currently pregnant 08/23/2017  . History of postpartum hemorrhage, currently pregnant 08/02/2017  . Chlamydia infection, current pregnancy 08/02/2017  . Retained products of conception after delivery with complications   . Supervision of other normal pregnancy, antepartum 11/06/2014    Assessment: Deanna Sullivan is a 22 y.o. G2P2002 at [redacted]w[redacted]d here for SOL.  #Labor: Expectant management  #Pain: Plans natural delivery, possible epidural  #FWB: Cat I #ID:  GBS neg  #MOF: Formula  #MOC:Nexplanon #Circ:  Yes, outpatient   Deanna Sullivan, CNM 01/02/2018, 9:00 AM

## 2018-01-03 NOTE — Progress Notes (Signed)
Post Partum Day 1  Subjective: no complaints, up ad lib, voiding, tolerating PO and + flatus  Objective: Blood pressure 102/70, pulse 69, temperature 98.9 F (37.2 C), temperature source Oral, resp. rate 17, height 5\' 3"  (1.6 m), weight 146 lb (66.2 kg), last menstrual period 03/28/2017, SpO2 100 %, unknown if currently breastfeeding.  Physical Exam:  General: alert, cooperative and no distress Lochia: appropriate Uterine Fundus: firm Incision: NA DVT Evaluation: Negative Homan's sign.  Recent Labs    01/02/18 0655  HGB 9.6*  HCT 30.1*    Assessment/Plan: Plan for discharge tomorrow   LOS: 1 day   Thressa Sheller 01/03/2018, 10:23 AM

## 2018-01-04 MED ORDER — IBUPROFEN 600 MG PO TABS: 600.0000 mg | ORAL_TABLET | Freq: Four times a day (QID) | ORAL | 0 refills | Status: DC | PRN

## 2018-01-04 NOTE — Progress Notes (Signed)
Siri Cole RN contacted at [phone].  Awaiting response.

## 2018-01-04 NOTE — Lactation Note (Signed)
This note was copied from a baby's chart. Lactation Consultation Note  Patient Name: Deanna Sullivan Date: 01/04/2018 Reason for consult: Follow-up assessment   P2, Mother bf first child for 5 months.  Mother is formula feeding because she is tired. Discussed side lying breastfeeding and supply and demand. Mother denies problems or concerns. Provided mother w/ manual pump. Mom encouraged to feed baby 8-12 times/24 hours and with feeding cues.  Reviewed engorgement care and monitoring voids/stools.    Maternal Data    Feeding Feeding Type: Bottle Fed - Formula Nipple Type: Slow - flow  LATCH Score                   Interventions    Lactation Tools Discussed/Used     Consult Status Consult Status: Complete    Deanna Sullivan 01/04/2018, 9:26 AM

## 2018-01-04 NOTE — Discharge Summary (Signed)
OB Discharge Summary     Patient Name: Deanna Sullivan DOB: [birth year] MRN: [id number]  Date of admission: 01/02/2018 Delivering MD: Sharyon Cable   Date of discharge: 01/04/2018  Admitting diagnosis: 40 WEEKS CTX Intrauterine pregnancy: [redacted]w[redacted]d     Secondary diagnosis:  Active Problems:   Normal labor   SVD (spontaneous vaginal delivery)  Additional problems: none     Discharge diagnosis: Term Pregnancy Delivered                                                                                                Post partum procedures:none  Augmentation: none  Complications: None   H/O severe pp sepsis/cardiomyopathy. Is currently afebrile. Denies sob, or any other sx.  Hospital course:  Onset of Labor With Vaginal Delivery     22 y.o. yo [id number] at [redacted]w[redacted]d was admitted in Active Labor on 01/02/2018. Patient had an uncomplicated labor course as follows:  Membrane Rupture Time/Date: 7:26 AM ,01/02/2018   Intrapartum Procedures: Episiotomy: None [1]                                         Lacerations:  Labial [10]  Patient had a delivery of a Viable infant. 01/02/2018  Information for the patient's newborn:  Karron, Kovacevic [[id number]]  Delivery Method: Vag-Spont    Pateint had an uncomplicated postpartum course.  She is ambulating, tolerating a regular diet, passing flatus, and urinating well. Patient is discharged home in stable condition on 01/04/18.   Physical exam  Vitals:   01/02/18 1400 01/03/18 0601 01/03/18 1822 01/04/18 0638  BP: 106/68 102/70 113/77 104/73  Pulse: 67 69 82 71  Resp: 18 17  20   Temp:  98.9 F (37.2 C) 98.2 F (36.8 C) 97.7 F (36.5 C)  TempSrc:  Oral Oral Oral  SpO2:   100% 100%  Weight:      Height:       General: alert, cooperative and no distress Lochia: appropriate Uterine Fundus: firm Incision: N/A DVT Evaluation: No evidence of DVT seen on physical exam. Negative Homan's sign. No cords or calf tenderness. No significant  calf/ankle edema. Labs: Lab Results  Component Value Date   WBC 8.7 01/02/2018   HGB 9.6 (L) 01/02/2018   HCT 30.1 (L) 01/02/2018   MCV 87.8 01/02/2018   PLT 254 01/02/2018   CMP Latest Ref Rng & Units 11/04/2016  Glucose 65 - 99 mg/dL 233(A)  BUN 6 - 20 mg/dL 12  Creatinine 0.76 - 2.26 mg/dL 3.33  Sodium 545 - 625 mmol/L 137  Potassium 3.5 - 5.1 mmol/L 3.7  Chloride 101 - 111 mmol/L 104  CO2 22 - 32 mmol/L 23  Calcium 8.9 - 10.3 mg/dL 9.0  Total Protein 6.5 - 8.1 g/dL 7.3  Total Bilirubin 0.3 - 1.2 mg/dL 0.6  Alkaline Phos 38 - 126 U/L 70  AST 15 - 41 U/L 17  ALT 14 - 54 U/L 10(L)    Discharge instruction: per After Visit Summary and "  Baby and Me Booklet".  After visit meds:  Allergies as of 01/04/2018   No Known Allergies     Medication List    STOP taking these medications   multivitamin-prenatal 27-0.8 MG Tabs tablet     TAKE these medications   ibuprofen 600 MG tablet Commonly known as:  ADVIL,MOTRIN Take 1 tablet (600 mg total) by mouth every 6 (six) hours as needed for mild pain, moderate pain or cramping.       Diet: routine diet  Activity: Advance as tolerated. Pelvic rest for 6 weeks.   Outpatient follow up:4-6wks for pp visit, BabyLove ordered for 1wk d/t h/o pp sepsis/cardiomyopathy  Follow up Appt: Future Appointments  Date Time Provider Department Center  02/06/2018  9:35 AM Reva Bores, MD [facility]   Follow up Visit:No follow-ups on file.  Postpartum contraception: abstinence until depo  Newborn Data: Live born female  Birth Weight: 6 lb 12.8 oz (3084 g) APGAR: 7, 9  Newborn Delivery   Birth date/time:  01/02/2018 07:35:00 Delivery type:  Vaginal, Spontaneous     Baby Feeding: Bottle Disposition:home with mother   01/04/2018 Cheral Marker, CNM

## 2018-01-04 NOTE — Discharge Instructions (Signed)
NO SEX UNTIL AFTER YOU GET YOUR BIRTH CONTROL  ° °Postpartum Care After Vaginal Delivery °The period of time right after you deliver your newborn is called the postpartum period. °What kind of medical care will I receive? °· You may continue to receive fluids and medicines through an IV tube inserted into one of your veins. °· If an incision was made near your vagina (episiotomy) or if you had some vaginal tearing during delivery, cold compresses may be placed on your episiotomy or your tear. This helps to reduce pain and swelling. °· You may be given a squirt bottle to use when you go to the bathroom. You may use this until you are comfortable wiping as usual. To use the squirt bottle, follow these steps: °? Before you urinate, fill the squirt bottle with warm water. Do not use hot water. °? After you urinate, while you are sitting on the toilet, use the squirt bottle to rinse the area around your urethra and vaginal opening. This rinses away any urine and blood. °? You may do this instead of wiping. As you start healing, you may use the squirt bottle before wiping yourself. Make sure to wipe gently. °? Fill the squirt bottle with clean water every time you use the bathroom. °· You will be given sanitary pads to wear. °How can I expect to feel? °· You may not feel the need to urinate for several hours after delivery. °· You will have some soreness and pain in your abdomen and vagina. °· If you are breastfeeding, you may have uterine contractions every time you breastfeed for up to several weeks postpartum. Uterine contractions help your uterus return to its normal size. °· It is normal to have vaginal bleeding (lochia) after delivery. The amount and appearance of lochia is often similar to a menstrual period in the first week after delivery. It will gradually decrease over the next few weeks to a dry, yellow-brown discharge. For most women, lochia stops completely by 6-8 weeks after delivery. Vaginal bleeding can  vary from woman to woman. °· Within the first few days after delivery, you may have breast engorgement. This is when your breasts feel heavy, full, and uncomfortable. Your breasts may also throb and feel hard, tightly stretched, warm, and tender. After this occurs, you may have milk leaking from your breasts. Your health care provider can help you relieve discomfort due to breast engorgement. Breast engorgement should go away within a few days. °· You may feel more sad or worried than normal due to hormonal changes after delivery. These feelings should not last more than a few days. If these feelings do not go away after several days, speak with your health care provider. °How should I care for myself? °· Tell your health care provider if you have pain or discomfort. °· Drink enough water to keep your urine clear or pale yellow. °· Wash your hands thoroughly with soap and water for at least 20 seconds after changing your sanitary pads, after using the toilet, and before holding or feeding your baby. °· If you are not breastfeeding, avoid touching your breasts a lot. Doing this can make your breasts produce more milk. °· If you become weak or lightheaded, or you feel like you might faint, ask for help before: °? Getting out of bed. °? Showering. °· Change your sanitary pads frequently. Watch for any changes in your flow, such as a sudden increase in volume, a change in color, the passing of large blood clots.   clots. If you pass a blood clot from your vagina, save it to show to your health care provider. Do not flush blood clots down the toilet without having your health care provider look at them.  Make sure that all your vaccinations are up to date. This can help protect you and your baby from getting certain diseases. You may need to have immunizations done before you leave the hospital.  If desired, talk with your health care provider about methods of family planning or birth control (contraception). How can I start  bonding with my baby? Spending as much time as possible with your baby is very important. During this time, you and your baby can get to know each other and develop a bond. Having your baby stay with you in your room (rooming in) can give you time to get to know your baby. Rooming in can also help you become comfortable caring for your baby. Breastfeeding can also help you bond with your baby. How can I plan for returning home with my baby?  Make sure that you have a car seat installed in your vehicle. ? Your car seat should be checked by a certified car seat installer to make sure that it is installed safely. ? Make sure that your baby fits into the car seat safely.  Ask your health care provider any questions you have about caring for yourself or your baby. Make sure that you are able to contact your health care provider with any questions after leaving the hospital. This information is not intended to replace advice given to you by your health care provider. Make sure you discuss any questions you have with your health care provider. Document Released: 05/30/2007 Document Revised: 01/05/2016 Document Reviewed: 07/07/2015 Elsevier Interactive Patient Education  [employer].

## 2018-01-06 ENCOUNTER — Encounter: Payer: Medicaid Other | Admitting: Obstetrics & Gynecology

## 2018-01-06 LAB — TYPE AND SCREEN
ABO/RH(D): O POS
Antibody Screen: POSITIVE
Donor AG Type: NEGATIVE
Donor AG Type: NEGATIVE
PT AG Type: NEGATIVE
Unit division: 0
Unit division: 0

## 2018-01-06 LAB — BPAM RBC
Blood Product Expiration Date: 201906152359
Blood Product Expiration Date: 201906152359
Unit Type and Rh: 9500
Unit Type and Rh: 9500

## 2018-02-06 ENCOUNTER — Ambulatory Visit: Payer: Medicaid Other | Admitting: Family Medicine

## 2018-02-07 ENCOUNTER — Encounter: Payer: Self-pay | Admitting: Family Medicine

## 2018-02-07 NOTE — Progress Notes (Signed)
Patient did not keep appointment today. She will be called to reschedule.  

## 2018-02-13 ENCOUNTER — Emergency Department (HOSPITAL_COMMUNITY): Payer: Medicaid Other

## 2018-02-13 ENCOUNTER — Emergency Department (HOSPITAL_COMMUNITY)
Admission: EM | Admit: 2018-02-13 | Discharge: 2018-02-13 | Disposition: A | Discharge status: Home / Self Care | Payer: Medicaid Other | Attending: Emergency Medicine | Admitting: Emergency Medicine

## 2018-02-13 ENCOUNTER — Emergency Department (HOSPITAL_BASED_OUTPATIENT_CLINIC_OR_DEPARTMENT_OTHER): Payer: Medicaid Other

## 2018-02-13 ENCOUNTER — Other Ambulatory Visit: Payer: Self-pay

## 2018-02-13 ENCOUNTER — Encounter (HOSPITAL_COMMUNITY): Payer: Self-pay | Admitting: *Deleted

## 2018-02-13 ENCOUNTER — Telehealth (HOSPITAL_BASED_OUTPATIENT_CLINIC_OR_DEPARTMENT_OTHER): Payer: Self-pay | Admitting: Emergency Medicine

## 2018-02-13 DIAGNOSIS — Z87891 Personal history of nicotine dependence: Secondary | ICD-10-CM | POA: Diagnosis not present

## 2018-02-13 DIAGNOSIS — R109 Unspecified abdominal pain: Secondary | ICD-10-CM | POA: Diagnosis present

## 2018-02-13 DIAGNOSIS — Z79899 Other long term (current) drug therapy: Secondary | ICD-10-CM | POA: Insufficient documentation

## 2018-02-13 DIAGNOSIS — R1031 Right lower quadrant pain: Secondary | ICD-10-CM | POA: Diagnosis not present

## 2018-02-13 DIAGNOSIS — N12 Tubulo-interstitial nephritis, not specified as acute or chronic: Secondary | ICD-10-CM

## 2018-02-13 DIAGNOSIS — R0602 Shortness of breath: Secondary | ICD-10-CM

## 2018-02-13 DIAGNOSIS — N10 Acute tubulo-interstitial nephritis: Secondary | ICD-10-CM | POA: Diagnosis not present

## 2018-02-13 DIAGNOSIS — R0600 Dyspnea, unspecified: Secondary | ICD-10-CM | POA: Diagnosis not present

## 2018-02-13 DIAGNOSIS — R000 Tachycardia, unspecified: Secondary | ICD-10-CM | POA: Diagnosis not present

## 2018-02-13 DIAGNOSIS — R112 Nausea with vomiting, unspecified: Secondary | ICD-10-CM | POA: Diagnosis not present

## 2018-02-13 LAB — URINALYSIS, ROUTINE W REFLEX MICROSCOPIC
Bilirubin Urine: NEGATIVE
Glucose, UA: NEGATIVE mg/dL
Ketones, ur: 20 mg/dL — AB
Nitrite: POSITIVE — AB
PROTEIN: 100 mg/dL — AB
Specific Gravity, Urine: 1.015 (ref 1.005–1.030)
WBC, UA: >50 WBC/hpf — ABNORMAL HIGH (ref 0–5)
pH: 6 (ref 5.0–8.0)

## 2018-02-13 LAB — CBC WITH DIFFERENTIAL/PLATELET
ABS IMMATURE GRANULOCYTES: 0 10*3/uL (ref 0.0–0.1)
BASOS PCT: 0 %
Basophils Absolute: 0 10*3/uL (ref 0.0–0.1)
EOS ABS: 0.1 10*3/uL (ref 0.0–0.7)
Eosinophils Relative: 1 %
HCT: 35.9 % — ABNORMAL LOW (ref 36.0–46.0)
Hemoglobin: 11.1 g/dL — ABNORMAL LOW (ref 12.0–15.0)
Immature Granulocytes: 0 %
Lymphocytes Relative: 12 %
Lymphs Abs: 1.3 10*3/uL (ref 0.7–4.0)
MCH: 26.4 pg (ref 26.0–34.0)
MCHC: 30.9 g/dL (ref 30.0–36.0)
MCV: 85.3 fL (ref 78.0–100.0)
MONO ABS: 1 10*3/uL (ref 0.1–1.0)
MONOS PCT: 9 %
NEUTROS ABS: 8.7 10*3/uL — AB (ref 1.7–7.7)
Neutrophils Relative %: 78 %
PLATELETS: 232 10*3/uL (ref 150–400)
RBC: 4.21 MIL/uL (ref 3.87–5.11)
RDW: 15 % (ref 11.5–15.5)
WBC: 11.1 10*3/uL — ABNORMAL HIGH (ref 4.0–10.5)

## 2018-02-13 LAB — COMPREHENSIVE METABOLIC PANEL
ALBUMIN: 3.5 g/dL (ref 3.5–5.0)
ALK PHOS: 94 U/L (ref 38–126)
ALT: 15 U/L (ref 0–44)
AST: 24 U/L (ref 15–41)
Anion gap: 14 (ref 5–15)
BUN: 8 mg/dL (ref 6–20)
CALCIUM: 8.9 mg/dL (ref 8.9–10.3)
CO2: 22 mmol/L (ref 22–32)
CREATININE: 0.93 mg/dL (ref 0.44–1.00)
Chloride: 98 mmol/L (ref 98–111)
GFR calc Af Amer: >60 mL/min (ref >60)
GFR calc non Af Amer: >60 mL/min (ref >60)
GLUCOSE: 104 mg/dL — AB (ref 70–99)
Potassium: 3.2 mmol/L — ABNORMAL LOW (ref 3.5–5.1)
SODIUM: 134 mmol/L — AB (ref 135–145)
Total Bilirubin: 1.3 mg/dL — ABNORMAL HIGH (ref 0.3–1.2)
Total Protein: 7.4 g/dL (ref 6.5–8.1)

## 2018-02-13 LAB — BLOOD CULTURE ID PANEL (REFLEXED)
Acinetobacter baumannii: NOT DETECTED
CANDIDA ALBICANS: NOT DETECTED
CANDIDA GLABRATA: NOT DETECTED
CANDIDA KRUSEI: NOT DETECTED
CANDIDA PARAPSILOSIS: NOT DETECTED
CANDIDA TROPICALIS: NOT DETECTED
Carbapenem resistance: NOT DETECTED
ENTEROBACTER CLOACAE COMPLEX: NOT DETECTED
ESCHERICHIA COLI: DETECTED — AB
Enterobacteriaceae species: DETECTED — AB
Enterococcus species: NOT DETECTED
Haemophilus influenzae: NOT DETECTED
KLEBSIELLA OXYTOCA: NOT DETECTED
KLEBSIELLA PNEUMONIAE: NOT DETECTED
Listeria monocytogenes: NOT DETECTED
Neisseria meningitidis: NOT DETECTED
PROTEUS SPECIES: NOT DETECTED
Pseudomonas aeruginosa: NOT DETECTED
SERRATIA MARCESCENS: NOT DETECTED
STAPHYLOCOCCUS AUREUS BCID: NOT DETECTED
STREPTOCOCCUS SPECIES: NOT DETECTED
Staphylococcus species: NOT DETECTED
Streptococcus agalactiae: NOT DETECTED
Streptococcus pneumoniae: NOT DETECTED
Streptococcus pyogenes: NOT DETECTED

## 2018-02-13 LAB — I-STAT CG4 LACTIC ACID, ED
Lactic Acid, Venous: 0.98 mmol/L (ref 0.5–1.9)
Lactic Acid, Venous: 0.37 mmol/L — ABNORMAL LOW (ref 0.5–1.9)

## 2018-02-13 LAB — I-STAT BETA HCG BLOOD, ED (MC, WL, AP ONLY): I-stat hCG, quantitative: <5 m[IU]/mL (ref <5)

## 2018-02-13 LAB — LIPASE, BLOOD: LIPASE: 24 U/L (ref 11–51)

## 2018-02-13 MED ORDER — CEFUROXIME AXETIL 500 MG PO TABS: 500.0000 mg | ORAL_TABLET | Freq: Two times a day (BID) | ORAL | 0 refills | Status: DC

## 2018-02-13 MED ORDER — ONDANSETRON 4 MG PO TBDP: 4.0000 mg | ORAL_TABLET | Freq: Three times a day (TID) | ORAL | 0 refills | Status: DC | PRN

## 2018-02-13 MED ORDER — IBUPROFEN 600 MG PO TABS: 600.0000 mg | ORAL_TABLET | Freq: Four times a day (QID) | ORAL | 0 refills | Status: DC | PRN

## 2018-02-13 MED ORDER — KETOROLAC TROMETHAMINE 30 MG/ML IJ SOLN
30.0000 mg | Freq: Once | INTRAMUSCULAR | Status: AC
  Administered 2018-02-13: 30 mg via INTRAVENOUS
  Filled 2018-02-13: qty 1

## 2018-02-13 MED ORDER — ACETAMINOPHEN 500 MG PO TABS
1000.0000 mg | ORAL_TABLET | Freq: Once | ORAL | Status: AC
  Administered 2018-02-13: 1000 mg via ORAL
  Filled 2018-02-13: qty 2

## 2018-02-13 MED ORDER — IOPAMIDOL (ISOVUE-370) INJECTION 76%
INTRAVENOUS | Status: AC
  Filled 2018-02-13: qty 100

## 2018-02-13 MED ORDER — MORPHINE SULFATE (PF) 4 MG/ML IV SOLN
4.0000 mg | Freq: Once | INTRAVENOUS | Status: AC
  Administered 2018-02-13: 4 mg via INTRAVENOUS
  Filled 2018-02-13: qty 1

## 2018-02-13 MED ORDER — IOPAMIDOL (ISOVUE-370) INJECTION 76%
100.0000 mL | Freq: Once | INTRAVENOUS | Status: AC | PRN
  Administered 2018-02-13: 80 mL via INTRAVENOUS

## 2018-02-13 MED ORDER — SODIUM CHLORIDE 0.9 % IV BOLUS
1000.0000 mL | Freq: Once | INTRAVENOUS | Status: AC
  Administered 2018-02-13: 1000 mL via INTRAVENOUS

## 2018-02-13 MED ORDER — PIPERACILLIN-TAZOBACTAM 3.375 G IVPB 30 MIN
3.3750 g | Freq: Once | INTRAVENOUS | Status: AC
  Administered 2018-02-13: 3.375 g via INTRAVENOUS
  Filled 2018-02-13: qty 50

## 2018-02-13 MED ORDER — ONDANSETRON HCL 4 MG/2ML IJ SOLN
4.0000 mg | Freq: Once | INTRAMUSCULAR | Status: AC
  Administered 2018-02-13: 4 mg via INTRAVENOUS
  Filled 2018-02-13: qty 2

## 2018-02-13 NOTE — Progress Notes (Signed)
*  PRELIMINARY RESULTS* Vascular Ultrasound Right lower extremity venous duplex has been completed.  Preliminary findings: No evidence of deep vein thrombosis or baker's cysts in the right lower extremity.   Chauncey Fischer 02/13/2018, 9:11 AM

## 2018-02-13 NOTE — ED Notes (Signed)
Pt placed on bedpan

## 2018-02-13 NOTE — ED Notes (Signed)
Patient transported to CT 

## 2018-02-13 NOTE — Discharge Instructions (Signed)
Thank you for allowing me to care for you today in the Emergency Department.   Your work-up was consistent with a kidney infection.  This is treated with antibiotics.  Take 1 tablet of cefuroxime 2 times daily for the next week.  Take your first dose tonight since you were given IV antibiotics in the emergency department.  Let 1 tablet of Zofran dissolve under your tongue every 8 hours as needed for nausea or vomiting.  Take 600 mg of ibuprofen with food or 650 mg of Tylenol every 6 hours for pain and fever control.  Your pain should significantly improve as your infection improves.  Please call and schedule follow-up appointment with your primary care provider for recheck in the next week.  Return to the emergency department if you continue to have a fever despite taking ibuprofen and Tylenol as directed, vomiting despite taking Zofran, or other new, concerning symptoms.

## 2018-02-13 NOTE — ED Triage Notes (Signed)
Pt arrives from home via [facility], c/o RLQ 10/10 abdominal pain x2 days with N/V. Pt endorses 4 episodes of vomitting in the past 24 hours. Pt reports seeing cloudiness in her urine today and felt pressure while urinating. Pt ambulatory on arrival.

## 2018-02-13 NOTE — ED Notes (Signed)
Pt given Ginger Ale.  

## 2018-02-13 NOTE — ED Notes (Signed)
ED Provider at bedside. 

## 2018-02-13 NOTE — ED Provider Notes (Signed)
Blood culture came back positive for e coli. Almost always true bacteremia. BP on soft side on ED visit earlier today although clinically it sounds like she was improving. Advised nursing to have patient return to ED.    Raeford Razor, MD 02/13/18 2140

## 2018-02-13 NOTE — Telephone Encounter (Signed)
Microbiology lab called with positive blood culture results. Dr. Juleen China consulted and wants pt to be re-evaluated in ED. Pt called, made aware of positive blood culture results. Pt instructed to go to ED for re-evaluation. Pt verbalized understanding and states she would go to ED.

## 2018-02-13 NOTE — ED Provider Notes (Signed)
[facility] EMERGENCY DEPARTMENT Provider Note   CSN: [id number] Arrival date & time: 02/13/18  4098     History   Chief Complaint Chief Complaint  Patient presents with  . Abdominal Pain    HPI Deanna Sullivan is a 22 y.o. female with a history of chlamydia, E. coli bacteremia, pyelonephritis, postpartum endometritis, DVT and PE in pregnancy, maternal cardiomyopathy, and aortic dissection who presents to the emergency department by PTAR with a chief complaint of fever with associated chills, myalgias, abdominal pain, right flank pain, nausea, vomiting, dysuria and dyspnea for 2 days.  T-max 102 at home last night.  She treated her symptoms with Tylenol, last dose at 1000 mg  at 10 PM last night.  She reports associated constant, worsening diffuse abdominal pain, characterized as a cramping and right-sided flank pain.  She has had 4 episodes of nonbilious, nonbloody vomiting since yesterday.  She denies diarrhea, vaginal pain, discharge, or bleeding, weakness, numbness, chest pain, cough, or rash.   Surgical history includes a D&C.  No other treatment prior to arrival. She is 5 weeks postpartum after giving birth on May 22. She has not restarted her menstrual cycle.   The history is provided by the patient. No language interpreter was used.    Past Medical History:  Diagnosis Date  . [redacted] weeks gestation of pregnancy   . Acute pyelonephritis   . Acute renal failure syndrome (HCC)   . Acute respiratory failure with hypoxia (HCC)   . Anemia   . Aortic dissection (HCC)   . Back pain   . Chlamydia   . E coli bacteremia   . Encounter for fetal anatomic survey   . Fetal size inconsistent with dates   . Fever and chills   . Hx of postpartum hemorrhage, currently pregnant   . Hypokalemia   . Hypomagnesemia   . IUGR, antenatal   . Leucocytosis   . Metabolic acidosis   . Normocytic anemia   . NSVD (normal spontaneous vaginal delivery)   . Peripartum cardiomyopathy    . Poor fetal growth, affecting management of mother, third trimester, not applicable or unspecified fetus   . Postpartum endometritis   . Pyelonephritis   . Retained placenta with hemorrhage, postpartum condition   . Shock, septic (HCC)   . Tachycardia, unspecified     Patient Active Problem List   Diagnosis Date Noted  . Normal labor 01/02/2018  . SVD (spontaneous vaginal delivery) 01/02/2018  . History of maternal cardiomyopathy, currently pregnant 08/23/2017  . History of postpartum hemorrhage, currently pregnant 08/02/2017  . Chlamydia infection, current pregnancy 08/02/2017  . Retained products of conception after delivery with complications   . Supervision of other normal pregnancy, antepartum 11/06/2014    Past Surgical History:  Procedure Laterality Date  . DILATION AND EVACUATION N/A 04/22/2015   Procedure: DILATATION AND EVACUATION;  Surgeon: Adam Phenix, MD;  Location: [facility];  Service: Gynecology;  Laterality: N/A;     OB History    Gravida  2   Para  2   Term  2   Preterm      AB      Living  2     SAB      TAB      Ectopic      Multiple  0   Live Births  2            Home Medications    Prior to Admission medications   Medication  Sig Start Date End Date Taking? Authorizing Provider  acetaminophen (TYLENOL) 325 MG tablet Take 325-650 mg by mouth every 6 (six) hours as needed (for pain).   Yes [provider]  cefUROXime (CEFTIN) 500 MG tablet Take 1 tablet (500 mg total) by mouth 2 (two) times daily with a meal for 7 days. 02/13/18 02/20/18  McDonald, Mia A, PA-C  ibuprofen (ADVIL,MOTRIN) 600 MG tablet Take 1 tablet (600 mg total) by mouth every 6 (six) hours as needed. 02/13/18   McDonald, Mia A, PA-C  ondansetron (ZOFRAN ODT) 4 MG disintegrating tablet Take 1 tablet (4 mg total) by mouth every 8 (eight) hours as needed for nausea or vomiting. 02/13/18   McDonald, Mia A, PA-C    Family History Family History  Problem Relation Age of  Onset  . Cancer Mother     Social History Social History   Tobacco Use  . Smoking status: Former Smoker    Packs/day: 0.50    Types: Cigarettes    Last attempt to quit: 04/18/2017    Years since quitting: 0.8  . Smokeless tobacco: Never Used  Substance Use Topics  . Alcohol use: No    Comment: occassionally  . Drug use: No     Allergies   Patient has no known allergies.   Review of Systems Review of Systems  Constitutional: Positive for chills and fever. Negative for activity change.  Respiratory: Positive for shortness of breath.   Cardiovascular: Negative for chest pain.  Gastrointestinal: Positive for nausea and vomiting. Negative for abdominal pain and diarrhea.  Genitourinary: Positive for dysuria and flank pain. Negative for hematuria, menstrual problem, vaginal bleeding, vaginal discharge and vaginal pain.  Musculoskeletal: Positive for myalgias. Negative for back pain, neck pain and neck stiffness.  Skin: Negative for rash.  Allergic/Immunologic: Negative for immunocompromised state.  Neurological: Negative for dizziness, weakness, numbness and headaches.  Psychiatric/Behavioral: Negative for confusion.     Physical Exam Updated Vital Signs BP (!) 105/58   Pulse 86   Temp 98.6 F (37 C) (Oral)   Resp 17   Ht 5\' 3"  (1.6 m)   Wt 55.8 kg (123 lb)   SpO2 98%   BMI 21.79 kg/m   Physical Exam  Constitutional: No distress.  Ill-appearing.  HENT:  Head: Normocephalic.  Eyes: Conjunctivae are normal.  Neck: Neck supple.  Cardiovascular: Regular rhythm. Tachycardia present. Exam reveals no gallop and no friction rub.  No murmur heard. Pulmonary/Chest: Effort normal. No stridor. Tachypnea noted. No respiratory distress. She has no wheezes. She has no rales. She exhibits no tenderness.  Abdominal: Soft. She exhibits no distension. There is generalized tenderness and tenderness in the right lower quadrant. There is guarding, CVA tenderness and tenderness at  McBurney's point. There is no rigidity, no rebound and negative Murphy's sign. No hernia.  Diffusely tender with palpation throughout the abdomen, but she is significantly more tender in the right lower quadrant and over McBurney's point.  Positive Rovsing sign.  Neurological: She is alert.  Skin: Skin is warm. No rash noted.  Psychiatric: Her behavior is normal.  Nursing note and vitals reviewed.    ED Treatments / Results  Labs (all labs ordered are listed, but only abnormal results are displayed) Labs Reviewed  COMPREHENSIVE METABOLIC PANEL - Abnormal; Notable for the following components:      Result Value   Sodium 134 (*)    Potassium 3.2 (*)    Glucose, Bld 104 (*)    Total Bilirubin 1.3 (*)  All other components within normal limits  CBC WITH DIFFERENTIAL/PLATELET - Abnormal; Notable for the following components:   WBC 11.1 (*)    Hemoglobin 11.1 (*)    HCT 35.9 (*)    Neutro Abs 8.7 (*)    All other components within normal limits  URINALYSIS, ROUTINE W REFLEX MICROSCOPIC - Abnormal; Notable for the following components:   APPearance CLOUDY (*)    Hgb urine dipstick SMALL (*)    Ketones, ur 20 (*)    Protein, ur 100 (*)    Nitrite POSITIVE (*)    Leukocytes, UA LARGE (*)    WBC, UA >50 (*)    Bacteria, UA FEW (*)    Non Squamous Epithelial 0-5 (*)    All other components within normal limits  I-STAT CG4 LACTIC ACID, ED - Abnormal; Notable for the following components:   Lactic Acid, Venous 0.37 (*)    All other components within normal limits  CULTURE, BLOOD (ROUTINE X 2)  CULTURE, BLOOD (ROUTINE X 2)  URINE CULTURE  LIPASE, BLOOD  I-STAT CG4 LACTIC ACID, ED  I-STAT BETA HCG BLOOD, ED ([facility], [facility], [facility] ONLY)    EKG EKG Interpretation  Date/Time:  Monday February 13 2018 07:19:20 EDT Ventricular Rate:  114 PR Interval:    QRS Duration: 83 QT Interval:  292 QTC Calculation: 402 R Axis:   84 Text Interpretation:  Sinus tachycardia Otherwise within normal limits  Confirmed by Gilda Crease [phone]) on 02/13/2018 7:26:37 AM   Radiology Dg Chest 2 View  Result Date: 02/13/2018 CLINICAL DATA:  Dyspnea. Right lower quadrant pain, nausea, and vomiting. EXAM: CHEST - 2 VIEW COMPARISON:  05/07/2015 FINDINGS: Support devices have been removed. The cardiomediastinal silhouette is within normal limits for AP technique. No airspace consolidation, edema, pleural effusion, pneumothorax is identified. No acute osseous abnormality is seen. IMPRESSION: No active cardiopulmonary disease. Electronically Signed   By: Sebastian Ache M.D.   On: 02/13/2018 08:30   Ct Angio Chest Pe W And/or Wo Contrast  Result Date: 02/13/2018 CLINICAL DATA:  Right lower quadrant pain, shortness of breath for 1 week EXAM: CT ANGIOGRAPHY CHEST CT ABDOMEN AND PELVIS WITH CONTRAST TECHNIQUE: Multidetector CT imaging of the chest was performed using the standard protocol during bolus administration of intravenous contrast. Multiplanar CT image reconstructions and MIPs were obtained to evaluate the vascular anatomy. Multidetector CT imaging of the abdomen and pelvis was performed using the standard protocol during bolus administration of intravenous contrast. CONTRAST:  80mL ISOVUE-370 IOPAMIDOL (ISOVUE-370) INJECTION 76% COMPARISON:  None. FINDINGS: CTA CHEST FINDINGS Cardiovascular: Satisfactory opacification of the pulmonary arteries to the segmental level. No evidence of pulmonary embolism. Normal heart size. No pericardial effusion. Mediastinum/Nodes: No enlarged mediastinal, hilar, or axillary lymph nodes. Thyroid gland, trachea, and esophagus demonstrate no significant findings. Lungs/Pleura: Lungs are clear. No pleural effusion or pneumothorax. Musculoskeletal: No chest wall abnormality. No acute or significant osseous findings. Review of the MIP images confirms the above findings. CT ABDOMEN and PELVIS FINDINGS Hepatobiliary: No focal liver abnormality is seen. No gallstones, gallbladder wall  thickening, or biliary dilatation. Pancreas: Unremarkable. No pancreatic ductal dilatation or surrounding inflammatory changes. Spleen: Normal in size without focal abnormality. Adrenals/Urinary Tract: Adrenal glands are unremarkable. Left kidney enhance normally and homogeneously. Overall hypoenhancement of the right kidney with striated nephrogram and mild right hydroureteronephrosis most consistent with pyelonephritis. Mild right urothelial enhancement. Bladder is unremarkable. Stomach/Bowel: Stomach is within normal limits. Appendix appears normal. No evidence of bowel wall thickening, distention, or  inflammatory changes. Vascular/Lymphatic: No significant vascular findings are present. No enlarged abdominal or pelvic lymph nodes. Reproductive: Uterus and bilateral adnexa are unremarkable. Other: Small amount of pelvic free fluid. No fluid collection or hematoma. Musculoskeletal: No acute osseous abnormality. No aggressive osseous lesion. Review of the MIP images confirms the above findings. IMPRESSION: 1. No evidence of pulmonary embolus. 2. Right pyelonephritis.  Mild right hydroureteronephrosis. Electronically Signed   By: Elige Ko   On: 02/13/2018 11:00   Ct Abdomen Pelvis W Contrast  Result Date: 02/13/2018 CLINICAL DATA:  Right lower quadrant pain, shortness of breath for 1 week EXAM: CT ANGIOGRAPHY CHEST CT ABDOMEN AND PELVIS WITH CONTRAST TECHNIQUE: Multidetector CT imaging of the chest was performed using the standard protocol during bolus administration of intravenous contrast. Multiplanar CT image reconstructions and MIPs were obtained to evaluate the vascular anatomy. Multidetector CT imaging of the abdomen and pelvis was performed using the standard protocol during bolus administration of intravenous contrast. CONTRAST:  80mL ISOVUE-370 IOPAMIDOL (ISOVUE-370) INJECTION 76% COMPARISON:  None. FINDINGS: CTA CHEST FINDINGS Cardiovascular: Satisfactory opacification of the pulmonary arteries to  the segmental level. No evidence of pulmonary embolism. Normal heart size. No pericardial effusion. Mediastinum/Nodes: No enlarged mediastinal, hilar, or axillary lymph nodes. Thyroid gland, trachea, and esophagus demonstrate no significant findings. Lungs/Pleura: Lungs are clear. No pleural effusion or pneumothorax. Musculoskeletal: No chest wall abnormality. No acute or significant osseous findings. Review of the MIP images confirms the above findings. CT ABDOMEN and PELVIS FINDINGS Hepatobiliary: No focal liver abnormality is seen. No gallstones, gallbladder wall thickening, or biliary dilatation. Pancreas: Unremarkable. No pancreatic ductal dilatation or surrounding inflammatory changes. Spleen: Normal in size without focal abnormality. Adrenals/Urinary Tract: Adrenal glands are unremarkable. Left kidney enhance normally and homogeneously. Overall hypoenhancement of the right kidney with striated nephrogram and mild right hydroureteronephrosis most consistent with pyelonephritis. Mild right urothelial enhancement. Bladder is unremarkable. Stomach/Bowel: Stomach is within normal limits. Appendix appears normal. No evidence of bowel wall thickening, distention, or inflammatory changes. Vascular/Lymphatic: No significant vascular findings are present. No enlarged abdominal or pelvic lymph nodes. Reproductive: Uterus and bilateral adnexa are unremarkable. Other: Small amount of pelvic free fluid. No fluid collection or hematoma. Musculoskeletal: No acute osseous abnormality. No aggressive osseous lesion. Review of the MIP images confirms the above findings. IMPRESSION: 1. No evidence of pulmonary embolus. 2. Right pyelonephritis.  Mild right hydroureteronephrosis. Electronically Signed   By: Elige Ko   On: 02/13/2018 11:00    Procedures .Critical Care Performed by: Barkley Boards, PA-C Authorized by: Barkley Boards, PA-C   Critical care provider statement:    Critical care time (minutes):  35    Critical care time was exclusive of:  Separately billable procedures and treating other patients and teaching time   Critical care was necessary to treat or prevent imminent or life-threatening deterioration of the following conditions:  Sepsis   Critical care was time spent personally by me on the following activities:  Ordering and performing treatments and interventions, ordering and review of laboratory studies, ordering and review of radiographic studies, pulse oximetry, re-evaluation of patient's condition, review of old charts, examination of patient, evaluation of patient's response to treatment and development of treatment plan with patient or surrogate   (including critical care time)  Medications Ordered in ED Medications  iopamidol (ISOVUE-370) 76 % injection (has no administration in time range)  ondansetron (ZOFRAN) injection 4 mg (4 mg Intravenous Given 02/13/18 0738)  morphine 4 MG/ML injection 4 mg (4 mg  Intravenous Given 02/13/18 0738)  acetaminophen (TYLENOL) tablet 1,000 mg (1,000 mg Oral Given 02/13/18 0737)  sodium chloride 0.9 % bolus 1,000 mL (0 mLs Intravenous Stopped 02/13/18 0839)  piperacillin-tazobactam (ZOSYN) IVPB 3.375 g (0 g Intravenous Stopped 02/13/18 0915)  morphine 4 MG/ML injection 4 mg (4 mg Intravenous Given 02/13/18 0942)  iopamidol (ISOVUE-370) 76 % injection 100 mL (80 mLs Intravenous Contrast Given 02/13/18 1000)  ketorolac (TORADOL) 30 MG/ML injection 30 mg (30 mg Intravenous Given 02/13/18 1127)     Initial Impression / Assessment and Plan / ED Course  I have reviewed the triage vital signs and the nursing notes.  Pertinent labs & imaging results that were available during my care of the patient were reviewed by me and considered in my medical decision making (see chart for details).  22 year old female with a history of chlamydia, E. coli bacteremia, pyelonephritis, postpartum endometritis, and aortic dissection who presents to the emergency department by PTAR  with fever, chills, abdominal pain, right flank pain, dysuria, dyspnea, and myalgias for 2 days.  Clinical Course as of Feb 14 1144  Mon Feb 13, 2018  0650 Febrile to 102.8 on arrival with heart rate in the 110s.  Will initiate sepsis work-up.  Tylenol given for fever.  Zofran given for nausea and vomiting and morphine for pain.   [MM]  0800 Patient rechecked.  Appears much more comfortable.  Heart rate now is 10 5-1 15 on the monitor.  She reports that her pain has improved to 7/10. Shortness of breath is improving, but she is still feeling somewhat dyspneic.  She is endorsing tingling and some pain in the right lower calf.  She states that this feels somewhat like when she had her DVT previously.  Will order right lower extremity ultrasound and chest x-ray.  Review of the patient's medical record.  Previous urine cultures and blood cultures were sensitive to Zosyn.  Since differential diagnosis includes pyelonephritis and appendicitis, will cover for both with Zosyn.   [MM]  [id number] Patient has just returned from ultrasound.  Preliminary report is negative for DVT.  She reports that her pain is still at 7 out of 10.  She is still endorsing mild dyspnea.  She has not been able to provide a urine sample at this time.   [MM]  1114 CT demonstrates pyelonephritis.  No appendicitis.  No pulmonary embolism.  Previous urine cultures reviewed.  The patient was previously treated outpatient with cefuroxime.  Fever and tachycardia have resolved.  Will fluid challenge the patient and plan for discharge home with cefuroxime and antiemetic.   [MM]  1144 Patient has been successfully tolerating fluids without any additional episodes of vomiting.  Toradol was given for pain and she feels much better.  Will discharge the patient to home with cefuroxime, Zofran, and ibuprofen.  Recommended follow-up within the next week with her PCP for reevaluation.  She is hemodynamically stable in no acute distress.  Strict return  precautions to the ED given.  She is safe for discharge home with outpatient follow-up at this time.   [MM]    Clinical Course User Index [MM] McDonald, Mia A, PA-C      Final Clinical Impressions(s) / ED Diagnoses   Final diagnoses:  Pyelonephritis    ED Discharge Orders        Ordered    cefUROXime (CEFTIN) 500 MG tablet  2 times daily with meals     02/13/18 1123    ondansetron (ZOFRAN ODT) 4 MG  disintegrating tablet  Every 8 hours PRN     02/13/18 1123    ibuprofen (ADVIL,MOTRIN) 600 MG tablet  Every 6 hours PRN     02/13/18 1123       McDonald, Mia A, PA-C 02/13/18 1145    Blane Ohara, MD 02/14/18 1140    Blane Ohara, MD 02/14/18 1230

## 2018-02-14 LAB — URINE CULTURE

## 2018-02-15 ENCOUNTER — Other Ambulatory Visit: Payer: Self-pay

## 2018-02-15 ENCOUNTER — Inpatient Hospital Stay (HOSPITAL_COMMUNITY)
Admission: EM | Admit: 2018-02-15 | Discharge: 2018-02-16 | DRG: 776 | Disposition: A | Discharge status: Home / Self Care | Payer: Medicaid Other | Attending: Internal Medicine | Admitting: Internal Medicine

## 2018-02-15 ENCOUNTER — Inpatient Hospital Stay (HOSPITAL_COMMUNITY): Payer: Medicaid Other

## 2018-02-15 ENCOUNTER — Encounter (HOSPITAL_COMMUNITY): Payer: Self-pay

## 2018-02-15 DIAGNOSIS — O8621 Infection of kidney following delivery: Principal | ICD-10-CM | POA: Diagnosis present

## 2018-02-15 DIAGNOSIS — R7881 Bacteremia: Secondary | ICD-10-CM | POA: Diagnosis not present

## 2018-02-15 DIAGNOSIS — N12 Tubulo-interstitial nephritis, not specified as acute or chronic: Secondary | ICD-10-CM | POA: Diagnosis not present

## 2018-02-15 DIAGNOSIS — O9081 Anemia of the puerperium: Secondary | ICD-10-CM | POA: Diagnosis present

## 2018-02-15 DIAGNOSIS — N10 Acute tubulo-interstitial nephritis: Secondary | ICD-10-CM | POA: Diagnosis not present

## 2018-02-15 DIAGNOSIS — B9620 Unspecified Escherichia coli [E. coli] as the cause of diseases classified elsewhere: Secondary | ICD-10-CM | POA: Diagnosis present

## 2018-02-15 DIAGNOSIS — R0602 Shortness of breath: Secondary | ICD-10-CM

## 2018-02-15 DIAGNOSIS — A4151 Sepsis due to Escherichia coli [E. coli]: Secondary | ICD-10-CM | POA: Diagnosis not present

## 2018-02-15 DIAGNOSIS — Z87891 Personal history of nicotine dependence: Secondary | ICD-10-CM

## 2018-02-15 DIAGNOSIS — N1330 Unspecified hydronephrosis: Secondary | ICD-10-CM | POA: Diagnosis not present

## 2018-02-15 DIAGNOSIS — D649 Anemia, unspecified: Secondary | ICD-10-CM | POA: Diagnosis not present

## 2018-02-15 DIAGNOSIS — Z8679 Personal history of other diseases of the circulatory system: Secondary | ICD-10-CM | POA: Diagnosis not present

## 2018-02-15 DIAGNOSIS — Z809 Family history of malignant neoplasm, unspecified: Secondary | ICD-10-CM | POA: Diagnosis not present

## 2018-02-15 LAB — COMPREHENSIVE METABOLIC PANEL
ALT: 18 U/L (ref 0–44)
AST: 34 U/L (ref 15–41)
Albumin: 2.5 g/dL — ABNORMAL LOW (ref 3.5–5.0)
Alkaline Phosphatase: 76 U/L (ref 38–126)
Anion gap: 7 (ref 5–15)
CO2: 21 mmol/L — ABNORMAL LOW (ref 22–32)
Calcium: 7.4 mg/dL — ABNORMAL LOW (ref 8.9–10.3)
Chloride: 108 mmol/L (ref 98–111)
Creatinine, Ser: 0.72 mg/dL (ref 0.44–1.00)
Glucose, Bld: 83 mg/dL (ref 70–99)
POTASSIUM: 3.8 mmol/L (ref 3.5–5.1)
Sodium: 136 mmol/L (ref 135–145)
Total Bilirubin: 1.4 mg/dL — ABNORMAL HIGH (ref 0.3–1.2)
Total Protein: 5.8 g/dL — ABNORMAL LOW (ref 6.5–8.1)

## 2018-02-15 LAB — URINALYSIS, ROUTINE W REFLEX MICROSCOPIC
GLUCOSE, UA: NEGATIVE mg/dL
KETONES UR: 20 mg/dL — AB
Nitrite: POSITIVE — AB
PROTEIN: 30 mg/dL — AB
Specific Gravity, Urine: 1.018 (ref 1.005–1.030)
pH: 6 (ref 5.0–8.0)

## 2018-02-15 LAB — CULTURE, BLOOD (ROUTINE X 2): SPECIAL REQUESTS: ADEQUATE

## 2018-02-15 LAB — CBC WITH DIFFERENTIAL/PLATELET
ABS IMMATURE GRANULOCYTES: 0 10*3/uL (ref 0.0–0.1)
Basophils Absolute: 0 10*3/uL (ref 0.0–0.1)
Basophils Relative: 1 %
Eosinophils Absolute: 0 10*3/uL (ref 0.0–0.7)
Eosinophils Relative: 0 %
HCT: 35.4 % — ABNORMAL LOW (ref 36.0–46.0)
Hemoglobin: 10.9 g/dL — ABNORMAL LOW (ref 12.0–15.0)
Immature Granulocytes: 0 %
LYMPHS PCT: 25 %
Lymphs Abs: 1.4 10*3/uL (ref 0.7–4.0)
MCH: 26.1 pg (ref 26.0–34.0)
MCHC: 30.8 g/dL (ref 30.0–36.0)
MCV: 84.7 fL (ref 78.0–100.0)
MONO ABS: 0.6 10*3/uL (ref 0.1–1.0)
MONOS PCT: 11 %
NEUTROS ABS: 3.4 10*3/uL (ref 1.7–7.7)
Neutrophils Relative %: 63 %
Platelets: 283 10*3/uL (ref 150–400)
RBC: 4.18 MIL/uL (ref 3.87–5.11)
RDW: 14.6 % (ref 11.5–15.5)
WBC: 5.5 10*3/uL (ref 4.0–10.5)

## 2018-02-15 LAB — FERRITIN: FERRITIN: 56 ng/mL (ref 11–307)

## 2018-02-15 LAB — I-STAT CG4 LACTIC ACID, ED: Lactic Acid, Venous: 0.9 mmol/L (ref 0.5–1.9)

## 2018-02-15 LAB — PREGNANCY, URINE: Preg Test, Ur: NEGATIVE

## 2018-02-15 MED ORDER — IBUPROFEN 400 MG PO TABS
400.0000 mg | ORAL_TABLET | Freq: Four times a day (QID) | ORAL | Status: DC | PRN
Start: 2018-02-15 — End: 2018-02-16
  Administered 2018-02-15: 400 mg via ORAL
  Filled 2018-02-15: qty 1

## 2018-02-15 MED ORDER — MORPHINE SULFATE (PF) 2 MG/ML IV SOLN
1.0000 mg | INTRAVENOUS | Status: DC | PRN
  Administered 2018-02-15: 1 mg via INTRAVENOUS
  Administered 2018-02-15 – 2018-02-16 (×4): 2 mg via INTRAVENOUS
  Filled 2018-02-15 (×5): qty 1

## 2018-02-15 MED ORDER — ORAL CARE MOUTH RINSE
15.0000 mL | Freq: Two times a day (BID) | OROMUCOSAL | Status: DC
  Administered 2018-02-15 – 2018-02-16 (×2): 15 mL via OROMUCOSAL

## 2018-02-15 MED ORDER — SODIUM CHLORIDE 0.9 % IV BOLUS
1000.0000 mL | Freq: Once | INTRAVENOUS | Status: AC
  Administered 2018-02-15: 1000 mL via INTRAVENOUS

## 2018-02-15 MED ORDER — ONDANSETRON HCL 4 MG/2ML IJ SOLN: 4.0000 mg | Freq: Four times a day (QID) | INTRAMUSCULAR | Status: DC | PRN

## 2018-02-15 MED ORDER — SODIUM CHLORIDE 0.9 % IV SOLN
2.0000 g | INTRAVENOUS | Status: DC
  Administered 2018-02-16: 2 g via INTRAVENOUS
  Filled 2018-02-15: qty 20

## 2018-02-15 MED ORDER — ENOXAPARIN SODIUM 40 MG/0.4ML ~~LOC~~ SOLN
40.0000 mg | SUBCUTANEOUS | Status: DC
  Administered 2018-02-15: 40 mg via SUBCUTANEOUS
  Filled 2018-02-15: qty 0.4

## 2018-02-15 MED ORDER — SODIUM CHLORIDE 0.9 % IV BOLUS
2000.0000 mL | Freq: Once | INTRAVENOUS | Status: AC
  Administered 2018-02-15: 2000 mL via INTRAVENOUS

## 2018-02-15 MED ORDER — SODIUM CHLORIDE 0.9 % IV SOLN
2.0000 g | INTRAVENOUS | Status: DC
  Administered 2018-02-15: 2 g via INTRAVENOUS
  Filled 2018-02-15: qty 20

## 2018-02-15 MED ORDER — ACETAMINOPHEN 500 MG PO TABS: 1000.0000 mg | ORAL_TABLET | Freq: Four times a day (QID) | ORAL | Status: DC | PRN

## 2018-02-15 MED ORDER — KETOROLAC TROMETHAMINE 30 MG/ML IJ SOLN
15.0000 mg | Freq: Once | INTRAMUSCULAR | Status: AC
  Administered 2018-02-15: 15 mg via INTRAVENOUS
  Filled 2018-02-15: qty 1

## 2018-02-15 MED ORDER — WHITE PETROLATUM EX OINT
TOPICAL_OINTMENT | CUTANEOUS | Status: AC
  Administered 2018-02-15: 13:00:00
  Filled 2018-02-15: qty 28.35

## 2018-02-15 NOTE — ED Provider Notes (Signed)
[facility] EMERGENCY DEPARTMENT Provider Note   CSN: [id number] Arrival date & time: 02/15/18  0602     History   Chief Complaint Chief Complaint  Patient presents with  . Fever    HPI Deanna Sullivan is a 22 y.o. female who presents with positive blood cultures. PMH significant for recent diagnosis of pyelonephritis, hx of STD, hx of DVT/PE, hx of aortic dissection. She had been having fevers, right flank pain, N/V, SOB for several days. CT chest and abdomen/pelvis were obtained due to her hx of PE and it was consistent with pyelonephritis. She tolerated PO and was discharged with Cefuroxime. She was called on 7/1 to be re-evaluated in the ED due to +blood cultures. Cultures grew out E. Coli. Urine culture grew out multiple species. The patient states that since she was discharged she's continued to have symptoms which have not improved. She reports continued fevers, chills, N/V, inability to tolerate PO or her meds, as well as generalized pain and shortness of breath. She denies vaginal discharge or diarrhea.  HPI  Past Medical History:  Diagnosis Date  . [redacted] weeks gestation of pregnancy   . Acute pyelonephritis   . Acute renal failure syndrome (HCC)   . Acute respiratory failure with hypoxia (HCC)   . Anemia   . Aortic dissection (HCC)   . Back pain   . Chlamydia   . E coli bacteremia   . Encounter for fetal anatomic survey   . Fetal size inconsistent with dates   . Fever and chills   . Hx of postpartum hemorrhage, currently pregnant   . Hypokalemia   . Hypomagnesemia   . IUGR, antenatal   . Leucocytosis   . Metabolic acidosis   . Normocytic anemia   . NSVD (normal spontaneous vaginal delivery)   . Peripartum cardiomyopathy   . Poor fetal growth, affecting management of mother, third trimester, not applicable or unspecified fetus   . Postpartum endometritis   . Pyelonephritis   . Retained placenta with hemorrhage, postpartum condition   . Shock,  septic (HCC)   . Tachycardia, unspecified     Patient Active Problem List   Diagnosis Date Noted  . Normal labor 01/02/2018  . SVD (spontaneous vaginal delivery) 01/02/2018  . History of maternal cardiomyopathy, currently pregnant 08/23/2017  . History of postpartum hemorrhage, currently pregnant 08/02/2017  . Chlamydia infection, current pregnancy 08/02/2017  . Retained products of conception after delivery with complications   . Supervision of other normal pregnancy, antepartum 11/06/2014    Past Surgical History:  Procedure Laterality Date  . DILATION AND EVACUATION N/A 04/22/2015   Procedure: DILATATION AND EVACUATION;  Surgeon: Adam Phenix, MD;  Location: [facility];  Service: Gynecology;  Laterality: N/A;     OB History    Gravida  2   Para  2   Term  2   Preterm      AB      Living  2     SAB      TAB      Ectopic      Multiple  0   Live Births  2            Home Medications    Prior to Admission medications   Medication Sig Start Date End Date Taking? Authorizing Provider  acetaminophen (TYLENOL) 325 MG tablet Take 325-650 mg by mouth every 6 (six) hours as needed (for pain).    [provider]  cefUROXime (CEFTIN)  500 MG tablet Take 1 tablet (500 mg total) by mouth 2 (two) times daily with a meal for 7 days. 02/13/18 02/20/18  McDonald, Mia A, PA-C  ibuprofen (ADVIL,MOTRIN) 600 MG tablet Take 1 tablet (600 mg total) by mouth every 6 (six) hours as needed. 02/13/18   McDonald, Mia A, PA-C  ondansetron (ZOFRAN ODT) 4 MG disintegrating tablet Take 1 tablet (4 mg total) by mouth every 8 (eight) hours as needed for nausea or vomiting. 02/13/18   McDonald, Mia A, PA-C    Family History Family History  Problem Relation Age of Onset  . Cancer Mother     Social History Social History   Tobacco Use  . Smoking status: Former Smoker    Packs/day: 0.50    Types: Cigarettes    Last attempt to quit: 04/18/2017    Years since quitting: 0.8  . Smokeless  tobacco: Never Used  Substance Use Topics  . Alcohol use: No    Comment: occassionally  . Drug use: No     Allergies   Patient has no known allergies.   Review of Systems Review of Systems  Constitutional: Positive for chills and fever.  Respiratory: Positive for shortness of breath.   Cardiovascular: Negative for chest pain.  Gastrointestinal: Positive for abdominal pain, nausea and vomiting. Negative for diarrhea.  Genitourinary: Positive for dysuria and flank pain. Negative for vaginal discharge.  Musculoskeletal: Positive for myalgias.  All other systems reviewed and are negative.    Physical Exam Updated Vital Signs BP 112/69 (BP Location: Right Arm)   Pulse 95   Temp 100.3 F (37.9 C) (Oral)   Resp 12   Ht 5\' 3"  (1.6 m)   Wt 55.8 kg (123 lb)   SpO2 100%   BMI 21.79 kg/m   Physical Exam  Constitutional: She is oriented to person, place, and time. She appears well-developed and well-nourished. No distress.  Calm and cooperative  HENT:  Head: Normocephalic and atraumatic.  Eyes: Pupils are equal, round, and reactive to light. Conjunctivae are normal. Right eye exhibits no discharge. Left eye exhibits no discharge. No scleral icterus.  Neck: Normal range of motion.  Cardiovascular: Tachycardia present. Exam reveals no gallop and no friction rub.  No murmur heard. Pulmonary/Chest: Effort normal and breath sounds normal. No respiratory distress.  Abdominal: Soft. Bowel sounds are normal. She exhibits no distension and no mass. There is tenderness (generalized, bilateral CVA tenderness). There is no rebound and no guarding. No hernia.  Neurological: She is alert and oriented to person, place, and time.  Skin: Skin is warm and dry.  Psychiatric: She has a normal mood and affect. Her behavior is normal.  Nursing note and vitals reviewed.    ED Treatments / Results  Labs (all labs ordered are listed, but only abnormal results are displayed) Labs Reviewed    COMPREHENSIVE METABOLIC PANEL - Abnormal; Notable for the following components:      Result Value   CO2 21 (*)    BUN <5 (*)    Calcium 7.4 (*)    Total Protein 5.8 (*)    Albumin 2.5 (*)    Total Bilirubin 1.4 (*)    All other components within normal limits  CBC WITH DIFFERENTIAL/PLATELET - Abnormal; Notable for the following components:   Hemoglobin 10.9 (*)    HCT 35.4 (*)    All other components within normal limits  URINALYSIS, ROUTINE W REFLEX MICROSCOPIC - Abnormal; Notable for the following components:   Color, Urine AMBER (*)  APPearance HAZY (*)    Hgb urine dipstick SMALL (*)    Bilirubin Urine SMALL (*)    Ketones, ur 20 (*)    Protein, ur 30 (*)    Nitrite POSITIVE (*)    Leukocytes, UA SMALL (*)    Bacteria, UA MANY (*)    All other components within normal limits  CULTURE, BLOOD (ROUTINE X 2)  CULTURE, BLOOD (ROUTINE X 2)  URINE CULTURE  PREGNANCY, URINE  I-STAT CG4 LACTIC ACID, ED    EKG None  Radiology Ct Angio Chest Pe W And/or Wo Contrast  Result Date: 02/13/2018 CLINICAL DATA:  Right lower quadrant pain, shortness of breath for 1 week EXAM: CT ANGIOGRAPHY CHEST CT ABDOMEN AND PELVIS WITH CONTRAST TECHNIQUE: Multidetector CT imaging of the chest was performed using the standard protocol during bolus administration of intravenous contrast. Multiplanar CT image reconstructions and MIPs were obtained to evaluate the vascular anatomy. Multidetector CT imaging of the abdomen and pelvis was performed using the standard protocol during bolus administration of intravenous contrast. CONTRAST:  80mL ISOVUE-370 IOPAMIDOL (ISOVUE-370) INJECTION 76% COMPARISON:  None. FINDINGS: CTA CHEST FINDINGS Cardiovascular: Satisfactory opacification of the pulmonary arteries to the segmental level. No evidence of pulmonary embolism. Normal heart size. No pericardial effusion. Mediastinum/Nodes: No enlarged mediastinal, hilar, or axillary lymph nodes. Thyroid gland, trachea, and  esophagus demonstrate no significant findings. Lungs/Pleura: Lungs are clear. No pleural effusion or pneumothorax. Musculoskeletal: No chest wall abnormality. No acute or significant osseous findings. Review of the MIP images confirms the above findings. CT ABDOMEN and PELVIS FINDINGS Hepatobiliary: No focal liver abnormality is seen. No gallstones, gallbladder wall thickening, or biliary dilatation. Pancreas: Unremarkable. No pancreatic ductal dilatation or surrounding inflammatory changes. Spleen: Normal in size without focal abnormality. Adrenals/Urinary Tract: Adrenal glands are unremarkable. Left kidney enhance normally and homogeneously. Overall hypoenhancement of the right kidney with striated nephrogram and mild right hydroureteronephrosis most consistent with pyelonephritis. Mild right urothelial enhancement. Bladder is unremarkable. Stomach/Bowel: Stomach is within normal limits. Appendix appears normal. No evidence of bowel wall thickening, distention, or inflammatory changes. Vascular/Lymphatic: No significant vascular findings are present. No enlarged abdominal or pelvic lymph nodes. Reproductive: Uterus and bilateral adnexa are unremarkable. Other: Small amount of pelvic free fluid. No fluid collection or hematoma. Musculoskeletal: No acute osseous abnormality. No aggressive osseous lesion. Review of the MIP images confirms the above findings. IMPRESSION: 1. No evidence of pulmonary embolus. 2. Right pyelonephritis.  Mild right hydroureteronephrosis. Electronically Signed   By: Elige Ko   On: 02/13/2018 11:00   Ct Abdomen Pelvis W Contrast  Result Date: 02/13/2018 CLINICAL DATA:  Right lower quadrant pain, shortness of breath for 1 week EXAM: CT ANGIOGRAPHY CHEST CT ABDOMEN AND PELVIS WITH CONTRAST TECHNIQUE: Multidetector CT imaging of the chest was performed using the standard protocol during bolus administration of intravenous contrast. Multiplanar CT image reconstructions and MIPs were  obtained to evaluate the vascular anatomy. Multidetector CT imaging of the abdomen and pelvis was performed using the standard protocol during bolus administration of intravenous contrast. CONTRAST:  80mL ISOVUE-370 IOPAMIDOL (ISOVUE-370) INJECTION 76% COMPARISON:  None. FINDINGS: CTA CHEST FINDINGS Cardiovascular: Satisfactory opacification of the pulmonary arteries to the segmental level. No evidence of pulmonary embolism. Normal heart size. No pericardial effusion. Mediastinum/Nodes: No enlarged mediastinal, hilar, or axillary lymph nodes. Thyroid gland, trachea, and esophagus demonstrate no significant findings. Lungs/Pleura: Lungs are clear. No pleural effusion or pneumothorax. Musculoskeletal: No chest wall abnormality. No acute or significant osseous findings. Review of the MIP  images confirms the above findings. CT ABDOMEN and PELVIS FINDINGS Hepatobiliary: No focal liver abnormality is seen. No gallstones, gallbladder wall thickening, or biliary dilatation. Pancreas: Unremarkable. No pancreatic ductal dilatation or surrounding inflammatory changes. Spleen: Normal in size without focal abnormality. Adrenals/Urinary Tract: Adrenal glands are unremarkable. Left kidney enhance normally and homogeneously. Overall hypoenhancement of the right kidney with striated nephrogram and mild right hydroureteronephrosis most consistent with pyelonephritis. Mild right urothelial enhancement. Bladder is unremarkable. Stomach/Bowel: Stomach is within normal limits. Appendix appears normal. No evidence of bowel wall thickening, distention, or inflammatory changes. Vascular/Lymphatic: No significant vascular findings are present. No enlarged abdominal or pelvic lymph nodes. Reproductive: Uterus and bilateral adnexa are unremarkable. Other: Small amount of pelvic free fluid. No fluid collection or hematoma. Musculoskeletal: No acute osseous abnormality. No aggressive osseous lesion. Review of the MIP images confirms the above  findings. IMPRESSION: 1. No evidence of pulmonary embolus. 2. Right pyelonephritis.  Mild right hydroureteronephrosis. Electronically Signed   By: Elige Ko   On: 02/13/2018 11:00    Procedures Procedures (including critical care time)  Medications Ordered in ED Medications  cefTRIAXone (ROCEPHIN) 2 g in sodium chloride 0.9 % 100 mL IVPB (0 g Intravenous Stopped 02/15/18 0804)  sodium chloride 0.9 % bolus 1,000 mL (0 mLs Intravenous Stopped 02/15/18 0723)  ketorolac (TORADOL) 30 MG/ML injection 15 mg (15 mg Intravenous Given 02/15/18 0654)     Initial Impression / Assessment and Plan / ED Course  I have reviewed the triage vital signs and the nursing notes.  Pertinent labs & imaging results that were available during my care of the patient were reviewed by me and considered in my medical decision making (see chart for details).  22 year old female presents for re-evaluation after blood cultures grew E. Coli. She has a low grade temperature here but otherwise vitals are normal. On exam she has generalized low back pain with CVA tenderness and generalized abdominal pain. No active vomiting in the ED. CBC is remarkable for anemia which is around her baseline. CMP is unremarkable. UA shows evidence of ongoing infection. Preg test is negative. Discussed with pharmacy who recommends giving Rocephin 2g. We will admit for further management. Discussed with IM team who will admit  Final Clinical Impressions(s) / ED Diagnoses   Final diagnoses:  Sepsis due to Escherichia coli [facility])  Pyelonephritis    ED Discharge Orders    None       Bethel Born, PA-C 02/15/18 1610    Shon Baton, MD 02/20/18 [phone]

## 2018-02-15 NOTE — H&P (Addendum)
Date: 02/15/2018               Patient Name:  Deanna Sullivan MRN: [id number]  DOB: 03/03/1996 Age / Sex: 22 y.o., female   PCP: Department, [facility]         Medical Service: Internal Medicine Teaching Service         Attending Physician: Dr. Bonnetta Barry att. providers found    First Contact: Dr. Chesley Mires  Pager: [phone]  Second Contact: Dr. 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 She is six weeks postpartum from vaginal delivery on May 20, after she presented in active labor at 40 weeks. The birth and postpartum course for this most recent pregnancy have been uncomplicated until she developed pain abdominal pain three days ago.   She initially presented to the ED on July 1 with symptoms of fever associated with chills, myalgia, abdominal pain, right flank pain, nausea, vomiting, dysuria and dyspnea for 2 days.  She was found t to be febrile with a temp of 102.8 with blood pressure 105/58. Exam revealed generalized abdominal tenderness which was worse in the right lower quadrant associated with CVA tenderness. Urinalysis was consistent with urinary tract infection ( large leukocytes and positive nitrites). Her abdominal symptoms were improved after she was given a dose of Zosyn however she continued to feel dyspneic and reported tingling and pain in the right calf there was concern for DVT. A right lower extremity Doppler and chest x-ray were ordered. The right lower extremity Doppler did not have evidence of DVT. CT of  the abdomen showed right pyelonephritis and mild right hydroureteronephrosis, her fever and tachycardia had resolved and she was discharged in the ED on cefuroxime.  Blood and urine cultures were pending at the time of discharge resulted the following day with E. coli in 1 of 4 blood culture bottles.  Upon return home she continued to experience nausea and vomiting despite po Zofran and was not able to keep down any of the cefuroxime. Yesterday she was called and notified of positive blood cultures and asked to return to the ED. Just prior to return to the ED she noticed that her body began feeling warm and her right side and hurting again.  Pain is in the right lower side of her abdomen and right lower back.  She is vomited 4 times in the last day and has had nothing to eat or drink since her symptoms started 2 days ago. She denies burning with urination she does have a feeling of pressure towards the end of her urinary stream. She denies hematuria or increased urinary frequency. She has continue to have intermittent shortness of breath since that time that the abdominal pain started 2 days ago, this is not particularly worse with exertion.  She denies chest pain or leg pain or swelling.     Meds:  Current Meds  Medication Sig  . acetaminophen (TYLENOL) 325 MG tablet Take 325-650 mg by mouth every 6 (six) hours as needed (for pain).  . cefUROXime (CEFTIN) 500 MG tablet Take 1 tablet (500  mg total) by mouth 2 (two) times daily with a meal for 7 days.  Marland Kitchen ibuprofen (ADVIL,MOTRIN) 600 MG tablet Take 1 tablet (600 mg total) by mouth every 6 (six) hours as needed. (Patient taking differently: Take 600 mg by mouth every 6 (six) hours as needed (for pain). )  . ondansetron (ZOFRAN ODT) 4 MG disintegrating tablet Take 1 tablet (4 mg total) by mouth every 8 (eight) hours as needed for nausea or vomiting.     Allergies: Allergies as of 02/15/2018  . (No Known Allergies)   Past Medical History:  Diagnosis  Date  . [redacted] weeks gestation of pregnancy   . Acute pyelonephritis   . Acute renal failure syndrome (HCC)   . Acute respiratory failure with hypoxia (HCC)   . Anemia   . Aortic dissection (HCC)   . Back pain   . Chlamydia   . E coli bacteremia   . Encounter for fetal anatomic survey   . Fetal size inconsistent with dates   . Fever and chills   . Hx of postpartum hemorrhage, currently pregnant   . Hypokalemia   . Hypomagnesemia   . IUGR, antenatal   . Leucocytosis   . Metabolic acidosis   . Normocytic anemia   . NSVD (normal spontaneous vaginal delivery)   . Peripartum cardiomyopathy   . Poor fetal growth, affecting management of mother, third trimester, not applicable or unspecified fetus   . Postpartum endometritis   . Pyelonephritis   . Retained placenta with hemorrhage, postpartum condition   . Shock, septic (HCC)   . Tachycardia, unspecified     Family History:  No significant family medical history that she knows of  Mother and Father are alive and well  Two sons are healthy   Social History: Born and raised in [place], moved down to [place] about 7 years ago to stay with a maternal aunt.  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  She has two sons, one is 3 and one is 75 old.  During the day she takes care of her children.  She smoked cigarettes in the past, for less than 1 year she smoked less than 1 pack a day and quit when she realized she was pregnant with her second son.  Prior to the start of the most recent pregnancy she drank 1 glass of wine every weekend.  She denies any illicit substance use.  He is sexually active, she has not resumed sexual activity since giving birth 6 weeks ago.   Review of Systems: A complete ROS was negative except as per HPI.   Physical Exam: Blood pressure 100/61, pulse 68, temperature 100.3 F (37.9 C), temperature source Oral, resp. rate 19, height 5\' 3"  (1.6 m), weight 123 lb  (55.8 kg), SpO2 100 %, unknown if currently breastfeeding. General: No acute distress, appears uncomfortable HEENT: Nonicteric sclera, no conjunctival erythema, dry mucous membranes  Neck: Thyroid is not palpable, no lymphadenopathy in the anterior posterior cervical, auricular, or supraclavicular chains Cardiac: Regular rate and rhythm, no peripheral edema, no JVD, no calf tenderness or swelling Pulmonary: Normal work of breathing, lungs are clear to auscultation GI: Bowel sounds are active, abdomen is soft and nondistended, there is exquisite right lower quadrant and epigastric pain and guarding, right CVA tenderness, with palpation over the left lower quadrant she experiences right lower quadrant pain, no rebound tenderness Skin: Tattoo over the right forearm, no rashes over the exposed arms, legs,  or back Neuro: Awake alert and oriented x3, moves all extremities throughout the exam   EKG: personally reviewed my interpretation is sinus rhythm, rate 96, no acute ST or T wave abnormalities  Assessment & Plan by Problem: Active Problems:   * No active hospital problems. *  Pyelonephritis  Ecoli Bacteremia  22 year old woman with PMH pregnancy complicated by retained placenta leading to bacteremia and cardiomyopathy presents with right lower quadrant and right flank pain, fever, and hypotension found to have ecoli bacteremia. Presentation is concerning for pyelonephritis.  2016 [employer] task force defined sepsis as life threatening organ function caused by dysregulated host response to infection.  Organ dysfunction is classified as 2 or more points on the sofa score which includes PaO2 to FiO2 ratio, platelet dysfunction, hyperbilirubinemia, hypotension, decreased Glasgow coma scale, and renal dysfunction. She meets one point on the SOFA scoring system for the elevated bilirubin of 1.4. Although SIRS criteria is no longer included in the definition it is important to note that she has a temperature of  100.3 and blood pressure 106/70 after a 1 liter fluid bolus in the ED. qSOFA score for poor outcomes from sepsis is 0 ( SBP is >100, RR is < 22, and she is not altered). She does have a low bicarb with normal anion gap and lactic acid is not elevated.  - given e.coli bacteremia, presentation not consistent with sepsis, not immunocompromised, will continue ceftriaxone with plans for a 7-14 day antibiotic course  - continue fluid bolus, 2 additional liters, map has remained >60  - IV morphine 1-2 mg q3 hours for severe pain, ibuprofen mild pain  - zofran PRN for nausea  - Follow-up repeat blood cultures and sensitivities - Follow-up urine cultures - Follow up AM BMP  - consider CT renal stone study if symptoms and signs of infection are unimproved with antibiotics   Hx of postpartum cardiomyopathy  Reporting intermittent shortness of breath at the time of presentation. No signs of pulmonary or lower extremity edema on exam. 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 Recent echo 11/2017 showed a normal EF with no regional wall motion abnormalities.  - chest xray without concern for pulmonary edema  - consider repeat xray and echo if she develops worsening of the sensation of shortness of breath   Normocytic anemia  - Hgb 10.9, no signs or symptoms of active bleeding, review of chart shows that she has had persistent low hemoglobin dating back to 2015 with ferritin 30 when checked in 2018  - follow up ferritin, this may be falsely normal/ elevated in the setting of this acute infection    Dispo: Admit patient to Inpatient with expected length of stay greater than 2 midnights.  Signed: Eulah Pont, MD 02/15/2018, 9:13 AM  Pager: [phone]

## 2018-02-15 NOTE — ED Triage Notes (Addendum)
Per [facility], pt arrives from home due to fever/chills/vomitting. Pt was seen recently and told to return if positive blood cultures. Pt comes due to positive blood cultures. Pt reports not feeling better since being discharged. Pt endorses 5 episodes of vomiting in past 24 hours. Pt reports no relief from zofran prescription.

## 2018-02-15 NOTE — ED Notes (Signed)
Attempted report x1. 

## 2018-02-16 DIAGNOSIS — D649 Anemia, unspecified: Secondary | ICD-10-CM

## 2018-02-16 DIAGNOSIS — R7881 Bacteremia: Secondary | ICD-10-CM

## 2018-02-16 DIAGNOSIS — B9620 Unspecified Escherichia coli [E. coli] as the cause of diseases classified elsewhere: Secondary | ICD-10-CM

## 2018-02-16 DIAGNOSIS — N1330 Unspecified hydronephrosis: Secondary | ICD-10-CM

## 2018-02-16 DIAGNOSIS — N10 Acute tubulo-interstitial nephritis: Secondary | ICD-10-CM

## 2018-02-16 DIAGNOSIS — A4151 Sepsis due to Escherichia coli [E. coli]: Secondary | ICD-10-CM

## 2018-02-16 DIAGNOSIS — Z8679 Personal history of other diseases of the circulatory system: Secondary | ICD-10-CM

## 2018-02-16 LAB — BASIC METABOLIC PANEL
Anion gap: 7 (ref 5–15)
BUN: 7 mg/dL (ref 6–20)
CO2: 24 mmol/L (ref 22–32)
Calcium: 8.1 mg/dL — ABNORMAL LOW (ref 8.9–10.3)
Chloride: 110 mmol/L (ref 98–111)
Creatinine, Ser: 0.71 mg/dL (ref 0.44–1.00)
GFR calc Af Amer: >60 mL/min (ref >60)
GLUCOSE: 98 mg/dL (ref 70–99)
Potassium: 3.6 mmol/L (ref 3.5–5.1)
SODIUM: 141 mmol/L (ref 135–145)

## 2018-02-16 MED ORDER — CEFUROXIME AXETIL 500 MG PO TABS: 500.0000 mg | ORAL_TABLET | Freq: Two times a day (BID) | ORAL | 0 refills | Status: AC

## 2018-02-16 NOTE — Discharge Summary (Signed)
Name: Deanna Sullivan MRN: [id number] DOB: [birth year] 22 y.o. PCP: Department, [facility]  Date of Admission: 02/15/2018  6:05 AM Date of Discharge:  Attending Physician: Burns Spain, MD  Discharge Diagnosis: 1. Acute pyelonephritis complicated by E. Coli bacteremia  Discharge Medications: Allergies as of 02/16/2018   No Known Allergies     Medication List    TAKE these medications   acetaminophen 325 MG tablet Commonly known as:  TYLENOL Take 325-650 mg by mouth every 6 (six) hours as needed (for pain).   cefUROXime 500 MG tablet Commonly known as:  CEFTIN Take 1 tablet (500 mg total) by mouth 2 (two) times daily with a meal for 14 days.   ibuprofen 600 MG tablet Commonly known as:  ADVIL,MOTRIN Take 1 tablet (600 mg total) by mouth every 6 (six) hours as needed. What changed:  reasons to take this   ondansetron 4 MG disintegrating tablet Commonly known as:  ZOFRAN ODT Take 1 tablet (4 mg total) by mouth every 8 (eight) hours as needed for nausea or vomiting.       Disposition and follow-up:   DeannaDeanna Sullivan was discharged from [facility] in Good condition.  At the hospital follow up visit please address:  1.  Acute pyelonephritis with E. Coli bacteremia. Was not tolerating original course of PO antibiotics prescribed on 7/1 (cefuroxime). Responded well to IV Ceftriaxone. Switched back to PO Cefuroxime for 14 day course (to be completed on 03/02/18).  2.  Labs / imaging needed at time of follow-up: none  3.  Pending labs/ test needing follow-up: blood cultures; urine sensitivities.   Follow-up Appointments:   Hospital Course by problem list: 1. Acute pyelonephritis with E. Coli bacteremia. Deanna Sullivan originally presented to the ED on 7/1 RLQ and flank pain with associated n/v, fever, chills, dysuria and intermittent dyspnea for 2 days. U/A was consistent with urinary tract infection, and CT abd showed right pyelonephritis and  mild right hydroureteronephrosis. She received a dose of Zosyn while in the ED and was discharged on PO cefuroxime. Blood and urine cultures were pending at time of discharge. Patient continued to experience nausea, vomiting and abdominal pain and was unable to tolerate oral antibiotics. She was called on 7/2 that her blood culture was positive for E. Coli and was told to return to the ED. She returned to the ED on 7/3 with persistent symptoms. She was started on IV ceftriaxone. She remained afebrile throughout the night, was tolerating PO well, and her pain had completely resolved by the next morning. Her urine sensitivities from 7/1 were pansensitive. She was stable for discharge home on PO cefuroxime for 14 days. Repeat blood cultures and urine sensitivities are pending for follow-up at outpatient appointment.   Discharge Vitals:   BP 112/83 (BP Location: Right Arm)   Pulse 64   Temp (!) 97.5 F (36.4 C) (Oral)   Resp 16   Ht 5\' 3"  (1.6 m)   Wt 123 lb (55.8 kg)   LMP 01/25/2018   SpO2 100%   Breastfeeding? No   BMI 21.79 kg/m   Pertinent Labs, Studies, and Procedures:  CT ABDOMEN and PELVIS FINDINGS  Hepatobiliary: No focal liver abnormality is seen. No gallstones, gallbladder wall thickening, or biliary dilatation.  Pancreas: Unremarkable. No pancreatic ductal dilatation or surrounding inflammatory changes.  Spleen: Normal in size without focal abnormality.  Adrenals/Urinary Tract: Adrenal glands are unremarkable. Left kidney enhance normally and homogeneously. Overall hypoenhancement of the right kidney  with striated nephrogram and mild right hydroureteronephrosis most consistent with pyelonephritis. Mild right urothelial enhancement. Bladder is unremarkable.  Stomach/Bowel: Stomach is within normal limits. Appendix appears normal. No evidence of bowel wall thickening, distention, or inflammatory changes.  Vascular/Lymphatic: No significant vascular findings are  present. No enlarged abdominal or pelvic lymph nodes.  Reproductive: Uterus and bilateral adnexa are unremarkable.  Other: Small amount of pelvic free fluid. No fluid collection or hematoma.  Musculoskeletal: No acute osseous abnormality. No aggressive osseous lesion.  Discharge Instructions: Discharge Instructions    Call MD for:  difficulty breathing, headache or visual disturbances   Complete by:  As directed    Call MD for:  extreme fatigue   Complete by:  As directed    Call MD for:  hives   Complete by:  As directed    Call MD for:  persistant dizziness or light-headedness   Complete by:  As directed    Call MD for:  persistant nausea and vomiting   Complete by:  As directed    Call MD for:  redness, tenderness, or signs of infection (pain, swelling, redness, odor or green/yellow discharge around incision site)   Complete by:  As directed    Call MD for:  severe uncontrolled pain   Complete by:  As directed    Call MD for:  temperature >100.4   Complete by:  As directed    Diet - low sodium heart healthy   Complete by:  As directed    Discharge instructions   Complete by:  As directed    Please complete the 14 day course of antibiotics that have been prescribed.  If you are unable to tolerate, please let your primary care doctor know so they can make adjustments.  Call if you have a temperature > 100.4, worsening pain, nausea or vomiting.  Congratulations on baby Deanna Sullivan!   Increase activity slowly   Complete by:  As directed       Signed: Bridget Hartshorn, DO 02/16/2018, 12:32 PM   Pager: [phone]

## 2018-02-16 NOTE — Progress Notes (Signed)
   Subjective: Patient seen and evaluated at bedside. She states she is feeling much better. Her abdominal pain, nausea and vomiting have resolved. She was able to eat yesterday and this morning. Denies any fevers, chills, chest pain, shortness of breath, lower extremity edema, urinary symptoms, or back pain.   Objective:  Vital signs in last 24 hours: Vitals:   02/15/18 0945 02/15/18 1046 02/15/18 1533 02/16/18 0641  BP: 110/71 111/75 107/67 112/83  Pulse: 64 72 64 64  Resp: 17 16 16 16   Temp:  97.7 F (36.5 C) 97.9 F (36.6 C) (!) 97.5 F (36.4 C)  TempSrc:  Oral Oral Oral  SpO2: 100% 100% 100% 100%  Weight:      Height:       General: awake, alert, pleasant female lying in bed in NAD.  Cardiovascular: RRR with no murmurs, gallops, or rubs. Respiratory: no increased work of breathing. Lungs are clear to auscultation without rales or wheezes.  Abdominal: abdomen is soft and non-tender throughout. Bowel sounds + Back: no tenderness to palpation. No CVA tenderness.  MSK: no bilateral lower extremity edema.   Assessment/Plan:  Active Problems:   Pyelonephritis  1. Acute pyelonephritis complicated by E. coli bacteremia. Improved. Patient responded well to IV rocephin started at admission yesterday morning (7/3) at 6 am and has also been afebrile since admission. Her pain, nausea, and vomiting have completely resolved and she is tolerating PO well. Although repeat blood cultures are still pending, patient's original blood culture that was positive for E. Coli on 7/1 was pansensitive. Given she is stable and completely asymptomatic, we will go ahead and discharge her on Cefuroxime to complete a course for 14 days.  2. Hx of postpartum cardiomyopathy in 2016; recent echo 11/2017 showed normal EF Reported intermittent shortness of breath at the time of presentation. States this has completely resolved today. No signs of pulmonary or lower extremity edema on exam. CXR in the ED without  concern for pulmonary edema.   3.  Normocytic anemia  - Hgb 10.9, no signs or symptoms of active bleeding, review of chart shows that she has had persistent low hemoglobin dating back to 2015 with ferritin 30 when checked in 2018  - ferritin level came back at 56.      Dispo: Anticipated discharge today.   Lenward Chancellor D, DO 02/16/2018, 11:05 AM Pager: [phone]

## 2018-02-16 NOTE — Progress Notes (Signed)
  Date: 02/16/2018  Patient name: Deanna Sullivan  Medical record number: [id number]  Date of birth: 28-Oct-1995   I have seen and evaluated Deanna Sullivan and discussed their care with the Residency Team. Deanna Sullivan is a 21 yo female who is 6 weeks post partum. She presented to the ED on 7/1 and dx with R pyelo based on CT, sxs, exam, and labs and D/C'd on Cefuroxime. She was unable to take the PO ABX at home due to N/V and cont to have subjective fevers, R sided pain, anorexia, and vomiting. 1 of 2 blood cxs returned + for pan sensitive E coli and the pt was called on the 3rd to return to the ED. She was placed on Rocephin and admitted. Repeat urine and blood cxs obtained. This AM, she has tolerated [employer], has been afebrile since admission 7/3 6 AM, and feels very well.  PMHx, Fam Hx, and/or Soc Hx : Has 51 week old son, Deanna Sullivan.   Vitals:   02/15/18 1533 02/16/18 0641  BP: 107/67 112/83  Pulse: 64 64  Resp: 16 16  Temp: 97.9 F (36.6 C) (!) 97.5 F (36.4 C)  SpO2: 100% 100%  NAD, lying in bed HRRR no MRG LCTAB with good air flow ABD + BS, soft, NT, no flank pain Ext no edema Skin warm and dry  WBC 11.1 - 5.5 HgB 11.1 - 10.9 MCV 85 Ferritin 56 UA : 0-5 sq epi, 21-50 WBC, 21-50 RBC, + nitrite, sm LE  I personally viewed the CXR images and confirmed my reading with the official read. AP and lateral, slight rotation, good quality, no abnl  CT ABD R pyelo  Assessment and Plan: I have seen and evaluated the patient as outlined above. I agree with the formulated Assessment and Plan as detailed in the residents' note, with the following changes: Deanna Sullivan is a 22 yo female who is 6 weeks post partum who failed outpt PO ABX therapy for E Coli R pyelo and bacteremia due to inability to take PO 2/2 N/V. She has responded well to IV Rocephin and is now asxs. Her repeat blood cxs were obtained 7/3 when she had been unable to tolerate PO ABX. A retrospective study showed that 6% of cxs showed  persistent GNR bacteremia and repeat cxs are not uniformly rec to document GNR bacteremia clearance as long as pt is asxs.   1. Discuss with pt timing of D/C 2. Will need to complete 10-14 days of a nonFQ ABX course 3. Ask if pt is breastfeeding (OK if cont Cefuroxime)  Burns Spain, MD 7/4/201910:34 AM

## 2018-02-17 LAB — URINE CULTURE: Culture: >=100000 — AB

## 2018-02-18 LAB — CULTURE, BLOOD (ROUTINE X 2)
Culture: NO GROWTH
Special Requests: ADEQUATE

## 2018-02-20 LAB — CULTURE, BLOOD (ROUTINE X 2)
CULTURE: NO GROWTH
Culture: NO GROWTH
SPECIAL REQUESTS: ADEQUATE

## 2018-04-06 ENCOUNTER — Encounter (HOSPITAL_COMMUNITY): Payer: Self-pay | Admitting: *Deleted

## 2018-04-06 ENCOUNTER — Other Ambulatory Visit: Payer: Self-pay

## 2018-04-06 ENCOUNTER — Emergency Department (HOSPITAL_COMMUNITY)
Admission: EM | Admit: 2018-04-06 | Discharge: 2018-04-06 | Disposition: A | Discharge status: Home / Self Care | Payer: Medicaid Other | Attending: Emergency Medicine | Admitting: Emergency Medicine

## 2018-04-06 DIAGNOSIS — Z5321 Procedure and treatment not carried out due to patient leaving prior to being seen by health care provider: Secondary | ICD-10-CM | POA: Insufficient documentation

## 2018-04-06 DIAGNOSIS — R109 Unspecified abdominal pain: Secondary | ICD-10-CM | POA: Insufficient documentation

## 2018-04-06 LAB — CBC
HCT: 36 % (ref 36.0–46.0)
HEMOGLOBIN: 10.9 g/dL — AB (ref 12.0–15.0)
MCH: 25.6 pg — ABNORMAL LOW (ref 26.0–34.0)
MCHC: 30.3 g/dL (ref 30.0–36.0)
MCV: 84.5 fL (ref 78.0–100.0)
Platelets: 400 10*3/uL (ref 150–400)
RBC: 4.26 MIL/uL (ref 3.87–5.11)
RDW: 15.3 % (ref 11.5–15.5)
WBC: 8.1 10*3/uL (ref 4.0–10.5)

## 2018-04-06 LAB — COMPREHENSIVE METABOLIC PANEL
ALBUMIN: 3.3 g/dL — AB (ref 3.5–5.0)
ALT: 8 U/L (ref 0–44)
ANION GAP: 6 (ref 5–15)
AST: 13 U/L — ABNORMAL LOW (ref 15–41)
Alkaline Phosphatase: 75 U/L (ref 38–126)
BILIRUBIN TOTAL: 0.5 mg/dL (ref 0.3–1.2)
BUN: 9 mg/dL (ref 6–20)
CO2: 28 mmol/L (ref 22–32)
Calcium: 9 mg/dL (ref 8.9–10.3)
Chloride: 106 mmol/L (ref 98–111)
Creatinine, Ser: 0.93 mg/dL (ref 0.44–1.00)
GFR calc Af Amer: >60 mL/min (ref >60)
GFR calc non Af Amer: >60 mL/min (ref >60)
GLUCOSE: 91 mg/dL (ref 70–99)
Potassium: 4.2 mmol/L (ref 3.5–5.1)
Sodium: 140 mmol/L (ref 135–145)
TOTAL PROTEIN: 7.2 g/dL (ref 6.5–8.1)

## 2018-04-06 LAB — LIPASE, BLOOD: Lipase: 31 U/L (ref 11–51)

## 2018-04-06 LAB — I-STAT BETA HCG BLOOD, ED (MC, WL, AP ONLY)

## 2018-04-06 NOTE — ED Notes (Signed)
Not found in waiting room, called x3.

## 2018-04-06 NOTE — ED Triage Notes (Signed)
The pt is c/o abd and flank pain for 4-5 days with blooduy urine last pm  lmp last month

## 2018-04-07 ENCOUNTER — Emergency Department (HOSPITAL_COMMUNITY)
Admission: EM | Admit: 2018-04-07 | Discharge: 2018-04-07 | Disposition: A | Discharge status: Home / Self Care | Payer: Medicaid Other | Attending: Emergency Medicine | Admitting: Emergency Medicine

## 2018-04-07 ENCOUNTER — Encounter (HOSPITAL_COMMUNITY): Payer: Self-pay | Admitting: *Deleted

## 2018-04-07 DIAGNOSIS — R1030 Lower abdominal pain, unspecified: Secondary | ICD-10-CM | POA: Diagnosis not present

## 2018-04-07 DIAGNOSIS — Z87891 Personal history of nicotine dependence: Secondary | ICD-10-CM | POA: Insufficient documentation

## 2018-04-07 DIAGNOSIS — N3000 Acute cystitis without hematuria: Secondary | ICD-10-CM | POA: Insufficient documentation

## 2018-04-07 DIAGNOSIS — Z3202 Encounter for pregnancy test, result negative: Secondary | ICD-10-CM | POA: Insufficient documentation

## 2018-04-07 DIAGNOSIS — R109 Unspecified abdominal pain: Secondary | ICD-10-CM | POA: Diagnosis present

## 2018-04-07 LAB — URINALYSIS, ROUTINE W REFLEX MICROSCOPIC
BILIRUBIN URINE: NEGATIVE
Glucose, UA: NEGATIVE mg/dL
Ketones, ur: NEGATIVE mg/dL
NITRITE: NEGATIVE
PH: 7 (ref 5.0–8.0)
Protein, ur: 100 mg/dL — AB
SPECIFIC GRAVITY, URINE: 1.016 (ref 1.005–1.030)
WBC, UA: >50 WBC/hpf — ABNORMAL HIGH (ref 0–5)

## 2018-04-07 LAB — PREGNANCY, URINE: Preg Test, Ur: NEGATIVE

## 2018-04-07 MED ORDER — CEPHALEXIN 500 MG PO CAPS: 500.0000 mg | ORAL_CAPSULE | Freq: Three times a day (TID) | ORAL | 0 refills | Status: DC

## 2018-04-07 NOTE — ED Notes (Signed)
Follow up call made   No answer  04/07/18  1023  s Kadan Millstein rn

## 2018-04-07 NOTE — Discharge Instructions (Addendum)
Take the antibiotics as prescribed, follow-up with your primary care doctor next week the symptoms have not resolved, return to the emergency room for fever, vomiting, worsening symptoms

## 2018-04-07 NOTE — ED Triage Notes (Signed)
Pt complains of left sided flank pain for the past week, cloudy and bloody urine.

## 2018-04-07 NOTE — ED Provider Notes (Signed)
[facility] DEPT Provider Note   CSN: [id number] Arrival date & time: 04/07/18  1119     History   Chief Complaint Chief Complaint  Patient presents with  . Flank Pain    HPI Deanna Sullivan is a 22 y.o. female.  HPI Pt presents to the ED for flank pain that started about a week ago.  She has also noticed her urine has been clougy and has some blood in it.  Pt has been urinating a little less than normal.  No fevers.  No vomiting.  Some nausea.  Some constipation.  No vaginal discharge or bleeding.  LMP in July, she has not had her menses this month. Past Medical History:  Diagnosis Date  . [redacted] weeks gestation of pregnancy   . Acute pyelonephritis   . Acute renal failure syndrome ([place])   . Acute respiratory failure with hypoxia ([place])   . Anemia   . Aortic dissection ([place])   . Back pain   . Chlamydia   . E coli bacteremia   . Encounter for fetal anatomic survey   . Fetal size inconsistent with dates   . Fever and chills   . Hx of postpartum hemorrhage, currently pregnant   . Hypokalemia   . Hypomagnesemia   . IUGR, antenatal   . Leucocytosis   . Metabolic acidosis   . Normocytic anemia   . NSVD (normal spontaneous vaginal delivery)   . Peripartum cardiomyopathy   . Poor fetal growth, affecting management of mother, third trimester, not applicable or unspecified fetus   . Postpartum endometritis   . Pyelonephritis   . Retained placenta with hemorrhage, postpartum condition   . Shock, septic ([place])   . Tachycardia, unspecified     Patient Active Problem List   Diagnosis Date Noted  . Sepsis due to Escherichia coli ([place])   . Pyelonephritis 02/15/2018  . Normal labor 01/02/2018  . SVD (spontaneous vaginal delivery) 01/02/2018  . History of maternal cardiomyopathy, currently pregnant 08/23/2017  . History of postpartum hemorrhage, currently pregnant 08/02/2017  . Chlamydia infection, current pregnancy 08/02/2017  . Retained products of  conception after delivery with complications   . Supervision of other normal pregnancy, antepartum 11/06/2014    Past Surgical History:  Procedure Laterality Date  . DILATION AND EVACUATION N/A 04/22/2015   Procedure: DILATATION AND EVACUATION;  Surgeon: Woodroe Mode, MD;  Location: [facility];  Service: Gynecology;  Laterality: N/A;     OB History    Gravida  2   Para  2   Term  2   Preterm      AB      Living  2     SAB      TAB      Ectopic      Multiple  0   Live Births  2            Home Medications    Prior to Admission medications   Medication Sig Start Date End Date Taking? Authorizing Provider  acetaminophen (TYLENOL) 325 MG tablet Take 325-650 mg by mouth every 6 (six) hours as needed (for pain).   Yes [provider]  cephALEXin (KEFLEX) 500 MG capsule Take 1 capsule (500 mg total) by mouth 3 (three) times daily. 04/07/18   Dorie Rank, MD  ibuprofen (ADVIL,MOTRIN) 600 MG tablet Take 1 tablet (600 mg total) by mouth every 6 (six) hours as needed. Patient not taking: Reported on 04/07/2018 02/13/18   McDonald,  Mia A, PA-C  ondansetron (ZOFRAN ODT) 4 MG disintegrating tablet Take 1 tablet (4 mg total) by mouth every 8 (eight) hours as needed for nausea or vomiting. Patient not taking: Reported on 04/07/2018 02/13/18   Joanne Gavel, PA-C    Family History Family History  Problem Relation Age of Onset  . Cancer Mother     Social History Social History   Tobacco Use  . Smoking status: Former Smoker    Packs/day: 0.50    Types: Cigarettes    Last attempt to quit: 04/18/2017    Years since quitting: 0.9  . Smokeless tobacco: Never Used  Substance Use Topics  . Alcohol use: No    Comment: occassionally  . Drug use: No     Allergies   Patient has no known allergies.   Review of Systems Review of Systems  All other systems reviewed and are negative.    Physical Exam Updated Vital Signs BP 130/79 (BP Location: Left Arm)   Pulse 100    Temp 98.7 F (37.1 C) (Oral)   Resp 18   LMP 03/07/2018   SpO2 100%   Physical Exam  Constitutional: She appears well-developed and well-nourished. No distress.  HENT:  Head: Normocephalic and atraumatic.  Right Ear: External ear normal.  Left Ear: External ear normal.  Eyes: Conjunctivae are normal. Right eye exhibits no discharge. Left eye exhibits no discharge. No scleral icterus.  Neck: Neck supple. No tracheal deviation present.  Cardiovascular: Normal rate, regular rhythm and intact distal pulses.  Pulmonary/Chest: Effort normal and breath sounds normal. No stridor. No respiratory distress. She has no wheezes. She has no rales.  Abdominal: Soft. Bowel sounds are normal. She exhibits no distension. There is tenderness in the suprapubic area. There is no rebound and no guarding.  Musculoskeletal: She exhibits no edema or tenderness.  Neurological: She is alert. She has normal strength. No cranial nerve deficit (no facial droop, extraocular movements intact, no slurred speech) or sensory deficit. She exhibits normal muscle tone. She displays no seizure activity. Coordination normal.  Skin: Skin is warm and dry. No rash noted.  Psychiatric: She has a normal mood and affect.  Nursing note and vitals reviewed.    ED Treatments / Results  Labs (all labs ordered are listed, but only abnormal results are displayed) Labs Reviewed  URINALYSIS, ROUTINE W REFLEX MICROSCOPIC - Abnormal; Notable for the following components:      Result Value   APPearance CLOUDY (*)    Hgb urine dipstick SMALL (*)    Protein, ur 100 (*)    Leukocytes, UA LARGE (*)    WBC, UA >50 (*)    Bacteria, UA FEW (*)    All other components within normal limits  PREGNANCY, URINE     Procedures Procedures (including critical care time)  Medications Ordered in ED Medications - No data to display   Initial Impression / Assessment and Plan / ED Course  I have reviewed the triage vital signs and the  nursing notes.  Pertinent labs & imaging results that were available during my care of the patient were reviewed by me and considered in my medical decision making (see chart for details).   I reviewed the patient's laboratory tests from [facility] yesterday.  She does have anemia but this is stable.  Lipase and C met were otherwise unremarkable.  Patient's patency test was negative today.  Her urinalysis is consistent with a urinary tract infection.  Her symptoms also correlate with  UTI.  No signs to suggest pyelonephritis.  Plan on discharge home with prescription for Keflex.  Discussed warning signs to return to the ED and recommended follow-up with her primary doctor if symptoms have not resolved by the time she finishes the antibiotics.  Final Clinical Impressions(s) / ED Diagnoses   Final diagnoses:  Acute cystitis without hematuria    ED Discharge Orders         Ordered    cephALEXin (KEFLEX) 500 MG capsule  3 times daily     04/07/18 1312           Dorie Rank, MD 04/07/18 1329

## 2018-06-12 ENCOUNTER — Encounter: Payer: Self-pay | Admitting: *Deleted

## 2018-08-16 NOTE — L&D Delivery Note (Addendum)
OB/GYN Faculty Practice Delivery Note  Deanna Sullivan is a 23 y.o. [id number] s/p vaginal delivery at [redacted]w[redacted]d. She was admitted for IOL 2/2 IUGR (<3%).   ROM: 1h 76m with clear fluid GBS Status: negative Maximum Maternal Temperature: 99.4  Labor Progress: . Patient was admitted for IOL 2/2 IUGR (<3%). She had a Cook's catheter and pitocin was started. She was AROMed and progressed to completely dilated.  Delivery Date/Time: 04/17/19,  Delivery: Called to room and patient was complete and pushing. Head delivered ROA. There was one nuchal cord, which was loose and easily reduced. Shoulder and body delivered in usual fashion. Infant with spontaneous cry, placed on mother's abdomen, dried and stimulated. Cord clamped x 2 after 1-minute delay, and cut by FOB under my direct supervision. Cord blood drawn. Placenta delivered spontaneously with gentle cord traction. Fundus firm with massage and Pitocin (TXA was given prior to delivery due to history of PPH). Labia, perineum, vagina, and cervix inspected and found to be intact with a minor left labial abrasion which was hemostatic.   A Liletta IUD was placed after the delivery of the placenta using ring forceps and the strings were cut to 2 cm from the vaginal introitus.  Placenta: intact Complications: none Lacerations: hemostatic left labial abrasion EBL: 25 mL Analgesia: none  Postpartum Planning [x]  message to sent to schedule follow-up  [x]  vaccines UTD  Infant: female  APGARs 9 and 9  2390 g  Merilyn Baba, DO OB/GYN Fellow, Faculty Practice

## 2018-08-31 ENCOUNTER — Ambulatory Visit: Payer: Medicaid Other

## 2018-09-05 ENCOUNTER — Inpatient Hospital Stay (HOSPITAL_COMMUNITY)
Admission: AD | Admit: 2018-09-05 | Discharge: 2018-09-05 | Discharge status: Against Medical Advice | Payer: Medicaid Other | Attending: Obstetrics and Gynecology | Admitting: Obstetrics and Gynecology

## 2018-09-05 ENCOUNTER — Other Ambulatory Visit: Payer: Self-pay

## 2018-09-05 ENCOUNTER — Encounter (HOSPITAL_COMMUNITY): Payer: Self-pay | Admitting: *Deleted

## 2018-09-05 DIAGNOSIS — Z5321 Procedure and treatment not carried out due to patient leaving prior to being seen by health care provider: Secondary | ICD-10-CM | POA: Diagnosis not present

## 2018-09-05 DIAGNOSIS — R109 Unspecified abdominal pain: Secondary | ICD-10-CM | POA: Diagnosis not present

## 2018-09-05 LAB — CBC
HCT: 35.4 % — ABNORMAL LOW (ref 36.0–46.0)
Hemoglobin: 11.1 g/dL — ABNORMAL LOW (ref 12.0–15.0)
MCH: 26.6 pg (ref 26.0–34.0)
MCHC: 31.4 g/dL (ref 30.0–36.0)
MCV: 84.9 fL (ref 80.0–100.0)
NRBC: 0 % (ref 0.0–0.2)
PLATELETS: 264 10*3/uL (ref 150–400)
RBC: 4.17 MIL/uL (ref 3.87–5.11)
RDW: 16.7 % — ABNORMAL HIGH (ref 11.5–15.5)
WBC: 5.6 10*3/uL (ref 4.0–10.5)

## 2018-09-05 LAB — URINALYSIS, ROUTINE W REFLEX MICROSCOPIC
Bilirubin Urine: NEGATIVE
GLUCOSE, UA: NEGATIVE mg/dL
Hgb urine dipstick: NEGATIVE
Ketones, ur: NEGATIVE mg/dL
Nitrite: POSITIVE — AB
Protein, ur: NEGATIVE mg/dL
Specific Gravity, Urine: 1.016 (ref 1.005–1.030)
WBC, UA: >50 WBC/hpf — ABNORMAL HIGH (ref 0–5)
pH: 6 (ref 5.0–8.0)

## 2018-09-05 LAB — HCG, QUANTITATIVE, PREGNANCY: hCG, Beta Chain, Quant, S: 3281 m[IU]/mL — ABNORMAL HIGH (ref <5)

## 2018-09-05 NOTE — MAU Note (Signed)
Called pt, not in lobby.  

## 2018-09-05 NOTE — MAU Note (Signed)
Found out preg last wk.  Was also having real bad cramping and was bleeding.  Has been getting real light headed.

## 2018-09-05 NOTE — MAU Note (Signed)
Not in lobby #3 

## 2018-09-05 NOTE — MAU Note (Signed)
Called pt a second time, not in lobby.

## 2018-09-13 ENCOUNTER — Inpatient Hospital Stay (HOSPITAL_COMMUNITY): Payer: Medicaid Other

## 2018-09-13 ENCOUNTER — Inpatient Hospital Stay (HOSPITAL_COMMUNITY)
Admission: AD | Admit: 2018-09-13 | Discharge: 2018-09-13 | Disposition: A | Discharge status: Home / Self Care | Payer: Medicaid Other | Attending: Obstetrics and Gynecology | Admitting: Obstetrics and Gynecology

## 2018-09-13 ENCOUNTER — Encounter (HOSPITAL_COMMUNITY): Payer: Self-pay | Admitting: *Deleted

## 2018-09-13 DIAGNOSIS — Z3A01 Less than 8 weeks gestation of pregnancy: Secondary | ICD-10-CM | POA: Insufficient documentation

## 2018-09-13 DIAGNOSIS — Z87891 Personal history of nicotine dependence: Secondary | ICD-10-CM | POA: Diagnosis not present

## 2018-09-13 DIAGNOSIS — O26851 Spotting complicating pregnancy, first trimester: Secondary | ICD-10-CM | POA: Insufficient documentation

## 2018-09-13 DIAGNOSIS — R109 Unspecified abdominal pain: Secondary | ICD-10-CM

## 2018-09-13 DIAGNOSIS — N3001 Acute cystitis with hematuria: Secondary | ICD-10-CM | POA: Diagnosis not present

## 2018-09-13 DIAGNOSIS — N83292 Other ovarian cyst, left side: Secondary | ICD-10-CM | POA: Diagnosis not present

## 2018-09-13 DIAGNOSIS — O26891 Other specified pregnancy related conditions, first trimester: Secondary | ICD-10-CM

## 2018-09-13 DIAGNOSIS — O3481 Maternal care for other abnormalities of pelvic organs, first trimester: Secondary | ICD-10-CM | POA: Insufficient documentation

## 2018-09-13 DIAGNOSIS — O208 Other hemorrhage in early pregnancy: Secondary | ICD-10-CM | POA: Diagnosis not present

## 2018-09-13 LAB — URINALYSIS, ROUTINE W REFLEX MICROSCOPIC
Bilirubin Urine: NEGATIVE
Glucose, UA: NEGATIVE mg/dL
Hgb urine dipstick: NEGATIVE
Ketones, ur: NEGATIVE mg/dL
Nitrite: POSITIVE — AB
PH: 6 (ref 5.0–8.0)
Protein, ur: NEGATIVE mg/dL
Specific Gravity, Urine: 1.019 (ref 1.005–1.030)
WBC, UA: >50 WBC/hpf — ABNORMAL HIGH (ref 0–5)

## 2018-09-13 LAB — WET PREP, GENITAL
Sperm: NONE SEEN
Trich, Wet Prep: NONE SEEN
Yeast Wet Prep HPF POC: NONE SEEN

## 2018-09-13 MED ORDER — PROMETHAZINE HCL 25 MG PO TABS: 25.0000 mg | ORAL_TABLET | Freq: Four times a day (QID) | ORAL | 2 refills | Status: DC | PRN

## 2018-09-13 MED ORDER — CEPHALEXIN 500 MG PO CAPS: 500.0000 mg | ORAL_CAPSULE | Freq: Four times a day (QID) | ORAL | 0 refills | Status: DC

## 2018-09-13 NOTE — MAU Note (Signed)
Pt C/O lower abdominal pain for the last 1 1/2 weeks, started spotting two days ago, none today so far.

## 2018-09-13 NOTE — MAU Provider Note (Signed)
Chief Complaint: Abdominal Pain and Vaginal Bleeding   First Provider Initiated Contact with Patient 09/13/18 1333        SUBJECTIVE HPI: Deanna Sullivan is a 23 y.o. G3P2002 at [redacted]w[redacted]d by LMP who presents to maternity admissions reporting suprapubic pain for a week or two.  Worse today.  Also has dysuria and frequency. She denies vaginal bleeding, vaginal itching/burning, urinary symptoms, h/a, dizziness,  or fever/chills.  Does have nausea and vomting  Was here on 1/21 but did not stay for exam.    Abdominal Pain  This is a recurrent problem. The current episode started in the past 7 days. The onset quality is gradual. The problem occurs constantly. The problem has been unchanged. The pain is located in the suprapubic region. The quality of the pain is cramping and dull. The abdominal pain does not radiate. Pertinent negatives include no constipation, diarrhea or myalgias. The pain is aggravated by palpation. The pain is relieved by nothing. She has tried nothing for the symptoms.  Vaginal Bleeding  The patient's primary symptoms include pelvic pain and vaginal bleeding (spotting). The patient's pertinent negatives include no genital itching, genital lesions or genital odor. This is a new problem. The current episode started in the past 7 days. The problem occurs rarely. The problem has been resolved. The pain is mild. Associated symptoms include abdominal pain. Pertinent negatives include no constipation or diarrhea. The vaginal discharge was bloody. The vaginal bleeding is spotting. She has not been passing clots. She has not been passing tissue. Nothing aggravates the symptoms. She has tried nothing for the symptoms.   RN Note: Pt C/O lower abdominal pain for the last 1 1/2 weeks, started spotting two days ago, none today so far.  Past Medical History:  Diagnosis Date  . [redacted] weeks gestation of pregnancy   . Acute pyelonephritis   . Acute renal failure syndrome (HCC)   . Acute respiratory  failure with hypoxia (HCC)   . Anemia   . Aortic dissection (HCC)   . Back pain   . Chlamydia   . E coli bacteremia   . Encounter for fetal anatomic survey   . Fetal size inconsistent with dates   . Fever and chills   . Hx of postpartum hemorrhage, currently pregnant   . Hypokalemia   . Hypomagnesemia   . IUGR, antenatal   . Leucocytosis   . Metabolic acidosis   . Normocytic anemia   . NSVD (normal spontaneous vaginal delivery)   . Peripartum cardiomyopathy   . Poor fetal growth, affecting management of mother, third trimester, not applicable or unspecified fetus   . Postpartum endometritis   . Pyelonephritis   . Retained placenta with hemorrhage, postpartum condition   . Shock, septic (HCC)   . Tachycardia, unspecified    Past Surgical History:  Procedure Laterality Date  . DILATION AND EVACUATION N/A 04/22/2015   Procedure: DILATATION AND EVACUATION;  Surgeon: Adam Phenix, MD;  Location: [facility];  Service: Gynecology;  Laterality: N/A;   Social History   Socioeconomic History  . Marital status: Single    Spouse name: Not on file  . Number of children: Not on file  . Years of education: Not on file  . Highest education level: Not on file  Occupational History  . Not on file  Social Needs  . Financial resource strain: Not on file  . Food insecurity:    Worry: Not on file    Inability: Not on file  . Transportation  needs:    Medical: Not on file    Non-medical: Not on file  Tobacco Use  . Smoking status: Former Smoker    Packs/day: 0.50    Types: Cigarettes    Last attempt to quit: 04/18/2017    Years since quitting: 1.4  . Smokeless tobacco: Never Used  Substance and Sexual Activity  . Alcohol use: No    Comment: occassionally  . Drug use: No  . Sexual activity: Yes    Birth control/protection: None  Lifestyle  . Physical activity:    Days per week: Not on file    Minutes per session: Not on file  . Stress: Not on file  Relationships  . Social  connections:    Talks on phone: Not on file    Gets together: Not on file    Attends religious service: Not on file    Active member of club or organization: Not on file    Attends meetings of clubs or organizations: Not on file    Relationship status: Not on file  . Intimate partner violence:    Fear of current or ex partner: Not on file    Emotionally abused: Not on file    Physically abused: Not on file    Forced sexual activity: Not on file  Other Topics Concern  . Not on file  Social History Narrative  . Not on file   No current facility-administered medications on file prior to encounter.    Current Outpatient Medications on File Prior to Encounter  Medication Sig Dispense Refill  . acetaminophen (TYLENOL) 325 MG tablet Take 325-650 mg by mouth every 6 (six) hours as needed (for pain).    . cephALEXin (KEFLEX) 500 MG capsule Take 1 capsule (500 mg total) by mouth 3 (three) times daily. 15 capsule 0  . ibuprofen (ADVIL,MOTRIN) 600 MG tablet Take 1 tablet (600 mg total) by mouth every 6 (six) hours as needed. (Patient not taking: Reported on 04/07/2018) 30 tablet 0  . ondansetron (ZOFRAN ODT) 4 MG disintegrating tablet Take 1 tablet (4 mg total) by mouth every 8 (eight) hours as needed for nausea or vomiting. (Patient not taking: Reported on 04/07/2018) 20 tablet 0   No Known Allergies  I have reviewed patient's Past Medical Hx, Surgical Hx, Family Hx, Social Hx, medications and allergies.   ROS:  Review of Systems  Gastrointestinal: Positive for abdominal pain. Negative for constipation and diarrhea.  Genitourinary: Positive for pelvic pain and vaginal bleeding.  Musculoskeletal: Negative for myalgias.   Review of Systems  Other systems negative   Physical Exam  Physical Exam Patient Vitals for the past 24 hrs:  BP Temp Temp src Pulse Resp Height Weight  09/13/18 1145 (!) 100/59 97.9 F (36.6 C) Oral 82 16 5\' 4"  (1.626 m) 53.5 kg   Constitutional: Well-developed,  well-nourished female in no acute distress.  Cardiovascular: normal rate Respiratory: normal effort GI: Abd soft, non-tender. Pos BS x 4 MS: Extremities nontender, no edema, normal ROM Neurologic: Alert and oriented x 4.  GU: Neg CVAT.  PELVIC EXAM: Cervix pink, visually closed, without lesion, scant white creamy discharge, vaginal walls and external genitalia normal Bimanual exam: Cervix 0/long/high, firm, anterior, neg CMT, uterus slightly tender, nonenlarged, adnexa without tenderness, enlargement, or mass   LAB RESULTS Results for orders placed or performed during the hospital encounter of 09/13/18 (from the past 24 hour(s))  Urinalysis, Routine w reflex microscopic     Status: Abnormal   Collection Time: 09/13/18  12:33 PM  Result Value Ref Range   Color, Urine YELLOW YELLOW   APPearance CLOUDY (A) CLEAR   Specific Gravity, Urine 1.019 1.005 - 1.030   pH 6.0 5.0 - 8.0   Glucose, UA NEGATIVE NEGATIVE mg/dL   Hgb urine dipstick NEGATIVE NEGATIVE   Bilirubin Urine NEGATIVE NEGATIVE   Ketones, ur NEGATIVE NEGATIVE mg/dL   Protein, ur NEGATIVE NEGATIVE mg/dL   Nitrite POSITIVE (A) NEGATIVE   Leukocytes, UA MODERATE (A) NEGATIVE   RBC / HPF 0-5 0 - 5 RBC/hpf   WBC, UA >50 (H) 0 - 5 WBC/hpf   Bacteria, UA FEW (A) NONE SEEN   Squamous Epithelial / LPF 6-10 0 - 5   Mucus PRESENT   Wet prep, genital     Status: Abnormal   Collection Time: 09/13/18  1:38 PM  Result Value Ref Range   Yeast Wet Prep HPF POC NONE SEEN NONE SEEN   Trich, Wet Prep NONE SEEN NONE SEEN   Clue Cells Wet Prep HPF POC PRESENT (A) NONE SEEN   WBC, Wet Prep HPF POC FEW (A) NONE SEEN   Sperm NONE SEEN     --/--/O POS (05/20 [id number])  IMAGING [place] Ob Less Than 14 Weeks With Ob Transvaginal  Result Date: 09/13/2018 CLINICAL DATA:  Abdominal pain in 1st trimester pregnancy. Gestational age by LMP of 7 weeks 1 day. EXAM: OBSTETRIC <14 WK US AND TRANSVAGINAL OB US TECHNIQUE: Both transabdominal and transvaginal  ultrasound examinations were performed for complete evaluation of the gestation as well as the maternal uterus, adnexal regions, and pelvic cul-de-sac. Transvaginal technique was performed to assess early pregnancy. COMPARISON:  None. FINDINGS: Intrauterine gestational sac: Single Yolk sac:  Visualized. Embryo:  Visualized. Cardiac Activity: Visualized. Heart Rate: 124 bpm CRL:  5 mm   6 w   1 d                  US EDC: 05/08/2019 Subchorionic hemorrhage:  Small subchorionic hemorrhage is seen. Maternal uterus/adnexae: Normal appearance of right ovary. A simple appearing left ovarian cyst is seen measuring 4.2 cm. No suspicious adnexal mass or abnormal free fluid identified. IMPRESSION: Single living IUP measuring 6 weeks 1 day, with US EDC of 05/08/2019. Small subchorionic hemorrhage and 4 cm simple left ovarian cyst noted. Electronically Signed   By: Myles RosenthalJohn  Stahl M.D.   On: 09/13/2018 13:44     MAU Management/MDM: Ordered usual first trimester r/o ectopic labs.   Pelvic exam and cultures done Will check baseline Ultrasound to rule out ectopic.  This bleeding/pain can represent a normal pregnancy with bleeding, spontaneous abortion or even an ectopic which can be life-threatening.  The process as listed above helps to determine which of these is present.  Reviewed findings.  Discussed may [facility], but it is not likely to progress to SAB.  May have some LLQ pain due to CL cyst.   Needs to take antibiotic for UTI Urine to culture Rx Phenergan for nausea.  Zofran did not work for her  ASSESSMENT 1. Abdominal pain in pregnancy, first trimester   2.   Spotting, resolved 3.    Probable UTI 4.    Left ovarian cyst, likely Corpus Luteum  PLAN Discharge home Encouraged to seek prenatal care.  Plans care in our clinic again. Rx Keflex for probable UTI Rx Phenergan for nausea    Eating tips reviewed.  Pt stable at time of discharge. Encouraged to return here or to  other  Urgent Care/ED if she develops worsening of symptoms, increase in pain, fever, or other concerning symptoms.    Wynelle Bourgeois CNM, MSN Certified Nurse-Midwife 09/13/2018  1:41 PM

## 2018-09-13 NOTE — Discharge Instructions (Signed)
Morning Sickness  Morning sickness is when you feel sick to your stomach (nauseous) during pregnancy. You may feel sick to your stomach and throw up (vomit). You may feel sick in the morning, but you can feel this way at any time of day. Some women feel very sick to their stomach and cannot stop throwing up (hyperemesis gravidarum). Follow these instructions at home: Medicines  Take over-the-counter and prescription medicines only as told by your doctor. Do not take any medicines until you talk with your doctor about them first.  Taking multivitamins before getting pregnant can stop or lessen the harshness of morning sickness. Eating and drinking  Eat dry toast or crackers before getting out of bed.  Eat 5 or 6 small meals a day.  Eat dry and bland foods like rice and baked potatoes.  Do not eat greasy, fatty, or spicy foods.  Have someone cook for you if the smell of food causes you to feel sick or throw up.  If you feel sick to your stomach after taking prenatal vitamins, take them at night or with a snack.  Eat protein when you need a snack. Nuts, yogurt, and cheese are good choices.  Drink fluids throughout the day.  Try ginger ale made with real ginger, ginger tea made from fresh grated ginger, or ginger candies. General instructions  Do not use any products that have nicotine or tobacco in them, such as cigarettes and e-cigarettes. If you need help quitting, ask your doctor.  Use an air purifier to keep the air in your house free of smells.  Get lots of fresh air.  Try to avoid smells that make you feel sick.  Try: ? Wearing a bracelet that is used for seasickness (acupressure wristband). ? Going to a doctor who puts thin needles into certain body points (acupuncture) to improve how you feel. Contact a doctor if:  You need medicine to feel better.  You feel dizzy or light-headed.  You are losing weight. Get help right away if:  You feel very sick to your  stomach and cannot stop throwing up.  You pass out (faint).  You have very bad pain in your belly. Summary  Morning sickness is when you feel sick to your stomach (nauseous) during pregnancy.  You may feel sick in the morning, but you can feel this way at any time of day.  Making some changes to what you eat may help your symptoms go away. This information is not intended to replace advice given to you by your health care provider. Make sure you discuss any questions you have with your health care provider. Document Released: 09/09/2004 Document Revised: 09/02/2016 Document Reviewed: 09/02/2016 Elsevier Interactive Patient Education  2019 [employer]  Pregnancy and Urinary Tract Infection What is a urinary tract infection?  A urinary tract infection (UTI) is an infection of any part of the urinary tract. This includes the kidneys, the tubes that connect your kidneys to your bladder (ureters), the bladder, and the tube that carries urine out of your body (urethra). These organs make, store, and get rid of urine in the body.  An upper UTI affects the ureters and kidneys (pyelonephritis), and a lower UTI affects the bladder (cystitis) and urethra (urethritis). Most urinary tract infections are caused by bacteria in your genital area, around the entrance to your urinary tract (urethra). These bacteria grow and cause irritation and inflammation of your urinary tract. Why am I more likely to get a UTI during pregnancy? You  are more likely to develop a UTI during pregnancy because:  The physical and hormonal changes your body goes through can make it easier for bacteria to get into your urinary tract.  Your growing baby puts pressure on your uterus and can affect urine flow. Does a UTI place my baby at risk? An untreated UTI during pregnancy could lead to a kidney infection, which can cause health problems that could affect your baby. Possible complications of an untreated UTI  include:  Having your baby before 37 weeks of pregnancy (premature).  Having a baby with a low birth weight.  Developing high blood pressure during pregnancy (preeclampsia).  Having a low hemoglobin level (anemia). What are the symptoms of a UTI? Symptoms of a UTI include:  Needing to urinate right away (urgently).  Frequent urination or passing small amounts of urine frequently.  Pain or burning with urination.  Blood in the urine.  Urine that smells bad or unusual.  Trouble urinating.  Cloudy urine.  Pain in the abdomen or lower back.  Vaginal discharge. You may also have:  Vomiting or a decreased appetite.  Confusion.  Irritability or tiredness.  A fever.  Diarrhea. What are the treatment options for a UTI during pregnancy? Treatment for this condition may include:  Antibiotic medicines that are safe to take during pregnancy.  Other medicines to treat less common causes of UTI. How can I prevent a UTI? To prevent a UTI:  Go to the bathroom as soon as you feel the need. Do not hold urine for long periods of time.  Always wipe from front to back after a bowel movement. Use each tissue one time when you wipe.  Empty your bladder after sex.  Keep your genital area dry.  Drink 6-10 glasses of water each day.  Do not douche or use deodorant sprays. Contact a health care provider if:  Your symptoms do not improve or they get worse.  You have abnormal vaginal discharge. Get help right away if:  You have a fever.  You have nausea and vomiting.  You have back or side pain.  You feel contractions in your uterus.  You have lower belly pain.  You have a gush of fluid from your vagina.  You have blood in your urine. Summary  A urinary tract infection (UTI) is an infection of any part of the urinary tract, which includes the kidneys, ureters, bladder, and urethra.  Most urinary tract infections are caused by bacteria in your genital area, around  the entrance to your urinary tract (urethra).  You are more likely to develop a UTI during pregnancy.  If you were prescribed an antibiotic, take it as told by your health care provider. Do not stop taking the antibiotic even if you start to feel better. This information is not intended to replace advice given to you by your health care provider. Make sure you discuss any questions you have with your health care provider. Document Released: 11/27/2010 Document Revised: 09/27/2017 Document Reviewed: 06/23/2015 Elsevier Interactive Patient Education  [employer].

## 2018-09-14 LAB — HIV ANTIBODY (ROUTINE TESTING W REFLEX): HIV SCREEN 4TH GENERATION: NONREACTIVE

## 2018-09-14 LAB — GC/CHLAMYDIA PROBE AMP (~~LOC~~) NOT AT ARMC
Chlamydia: NEGATIVE
Neisseria Gonorrhea: POSITIVE — AB

## 2018-09-15 LAB — CULTURE, OB URINE: Culture: >=100000 — AB

## 2018-09-18 ENCOUNTER — Ambulatory Visit: Payer: Medicaid Other

## 2018-09-20 DIAGNOSIS — H5213 Myopia, bilateral: Secondary | ICD-10-CM | POA: Diagnosis not present

## 2018-09-21 DIAGNOSIS — H5213 Myopia, bilateral: Secondary | ICD-10-CM | POA: Diagnosis not present

## 2018-10-04 ENCOUNTER — Encounter: Payer: Medicaid Other | Admitting: Medical

## 2018-10-04 ENCOUNTER — Encounter: Payer: Self-pay | Admitting: Medical

## 2018-10-19 ENCOUNTER — Encounter: Payer: Medicaid Other | Admitting: Obstetrics and Gynecology

## 2018-10-19 ENCOUNTER — Telehealth: Payer: Self-pay | Admitting: Obstetrics & Gynecology

## 2018-10-19 NOTE — Telephone Encounter (Signed)
Called the patient to inform of change in appointment. No answer, left two voicemail messages requesting that the patient call our clinic.

## 2018-10-30 ENCOUNTER — Encounter: Payer: Medicaid Other | Admitting: Advanced Practice Midwife

## 2018-10-30 ENCOUNTER — Encounter: Payer: Self-pay | Admitting: Advanced Practice Midwife

## 2018-11-01 DIAGNOSIS — H52223 Regular astigmatism, bilateral: Secondary | ICD-10-CM | POA: Diagnosis not present

## 2018-11-17 ENCOUNTER — Inpatient Hospital Stay (HOSPITAL_COMMUNITY)
Admission: AD | Admit: 2018-11-17 | Discharge: 2018-11-17 | Disposition: A | Discharge status: Home / Self Care | Payer: Medicaid Other | Source: Ambulatory Visit | Attending: Obstetrics & Gynecology | Admitting: Obstetrics & Gynecology

## 2018-11-17 ENCOUNTER — Encounter (HOSPITAL_COMMUNITY): Payer: Self-pay | Admitting: *Deleted

## 2018-11-17 ENCOUNTER — Other Ambulatory Visit: Payer: Self-pay

## 2018-11-17 DIAGNOSIS — R51 Headache: Secondary | ICD-10-CM | POA: Insufficient documentation

## 2018-11-17 DIAGNOSIS — O99012 Anemia complicating pregnancy, second trimester: Secondary | ICD-10-CM | POA: Diagnosis not present

## 2018-11-17 DIAGNOSIS — O9989 Other specified diseases and conditions complicating pregnancy, childbirth and the puerperium: Secondary | ICD-10-CM | POA: Diagnosis not present

## 2018-11-17 DIAGNOSIS — Z3A16 16 weeks gestation of pregnancy: Secondary | ICD-10-CM | POA: Insufficient documentation

## 2018-11-17 DIAGNOSIS — R1111 Vomiting without nausea: Secondary | ICD-10-CM | POA: Diagnosis not present

## 2018-11-17 DIAGNOSIS — Z809 Family history of malignant neoplasm, unspecified: Secondary | ICD-10-CM | POA: Insufficient documentation

## 2018-11-17 DIAGNOSIS — Z792 Long term (current) use of antibiotics: Secondary | ICD-10-CM | POA: Insufficient documentation

## 2018-11-17 DIAGNOSIS — D649 Anemia, unspecified: Secondary | ICD-10-CM | POA: Insufficient documentation

## 2018-11-17 DIAGNOSIS — Z3480 Encounter for supervision of other normal pregnancy, unspecified trimester: Secondary | ICD-10-CM

## 2018-11-17 DIAGNOSIS — Z87891 Personal history of nicotine dependence: Secondary | ICD-10-CM | POA: Diagnosis not present

## 2018-11-17 DIAGNOSIS — R110 Nausea: Secondary | ICD-10-CM | POA: Diagnosis not present

## 2018-11-17 DIAGNOSIS — O219 Vomiting of pregnancy, unspecified: Secondary | ICD-10-CM | POA: Insufficient documentation

## 2018-11-17 DIAGNOSIS — R109 Unspecified abdominal pain: Secondary | ICD-10-CM | POA: Diagnosis not present

## 2018-11-17 DIAGNOSIS — R1084 Generalized abdominal pain: Secondary | ICD-10-CM | POA: Diagnosis not present

## 2018-11-17 DIAGNOSIS — R112 Nausea with vomiting, unspecified: Secondary | ICD-10-CM | POA: Diagnosis not present

## 2018-11-17 DIAGNOSIS — R58 Hemorrhage, not elsewhere classified: Secondary | ICD-10-CM | POA: Diagnosis not present

## 2018-11-17 LAB — URINALYSIS, ROUTINE W REFLEX MICROSCOPIC
Bilirubin Urine: NEGATIVE
Glucose, UA: NEGATIVE mg/dL
Hgb urine dipstick: NEGATIVE
Ketones, ur: 15 mg/dL — AB
Leukocytes,Ua: NEGATIVE
Nitrite: NEGATIVE
Protein, ur: NEGATIVE mg/dL
Specific Gravity, Urine: 1.02 (ref 1.005–1.030)
pH: 6.5 (ref 5.0–8.0)

## 2018-11-17 MED ORDER — ONDANSETRON 8 MG PO TBDP: 8.0000 mg | ORAL_TABLET | Freq: Three times a day (TID) | ORAL | 0 refills | Status: DC | PRN

## 2018-11-17 MED ORDER — ONDANSETRON HCL 4 MG/2ML IJ SOLN
4.0000 mg | Freq: Once | INTRAMUSCULAR | Status: AC
  Administered 2018-11-17: 13:00:00 4 mg via INTRAVENOUS
  Filled 2018-11-17: qty 2

## 2018-11-17 MED ORDER — LACTATED RINGERS IV BOLUS
1000.0000 mL | Freq: Once | INTRAVENOUS | Status: AC
  Administered 2018-11-17: 12:00:00 1000 mL via INTRAVENOUS

## 2018-11-17 NOTE — Discharge Instructions (Signed)
When you have vomiting/diarrhea, the foods you eat and your eating habits are very important. Choosing the right foods and drinks can help:  Relieve diarrhea.  Replace lost fluids and nutrients.  Prevent dehydration. What general guidelines should I follow?  Relieving diarrhea  Choose foods with less than 2 g or .07 oz. of fiber per serving.  Limit fats to less than 8 tsp (38 g or 1.34 oz.) a day.  Avoid the following: ? Foods and beverages sweetened with high-fructose corn syrup, honey, or sugar alcohols such as xylitol, sorbitol, and mannitol. ? Foods that contain a lot of fat or sugar. ? Fried, greasy, or spicy foods. ? High-fiber grains, breads, and cereals. ? Raw fruits and vegetables.  Eat foods that are rich in probiotics. These foods include dairy products such as yogurt and fermented milk products. They help increase healthy bacteria in the stomach and intestines (gastrointestinal tract, or GI tract).  If you have lactose intolerance, avoid dairy products. These may make your diarrhea worse.  Take medicine to help stop diarrhea (antidiarrheal medicine) only as told by your health care provider. Replacing nutrients  Eat small meals or snacks every 3-4 hours.  Eat bland foods, such as white rice, toast, or baked potato, until your diarrhea starts to get better. Gradually reintroduce nutrient-rich foods as tolerated or as told by your health care provider. This includes: ? Well-cooked protein foods. ? Peeled, seeded, and soft-cooked fruits and vegetables. ? Low-fat dairy products.  Take vitamin and mineral supplements as told by your health care provider. Preventing dehydration  Start by sipping water or a special solution to prevent dehydration (oral rehydration solution, ORS). Urine that is clear or pale yellow means that you are getting enough fluid.  Try to drink at least 8-10 cups of fluid each day to help replace lost fluids.  You may add other liquids in  addition to water, such as clear juice or decaffeinated sports drinks, as tolerated or as told by your health care provider.  Avoid drinks with caffeine, such as coffee, tea, or soft drinks.  Avoid alcohol. What foods are recommended?     The items listed may not be a complete list. Talk with your health care provider about what dietary choices are best for you. Grains White rice. White, [place], or pita breads (fresh or toasted), including plain rolls, buns, or bagels. White pasta. Saltine, soda, or graham crackers. Pretzels. Low-fiber cereal. Cooked cereals made with water (such as cornmeal, farina, or cream cereals). Plain muffins. Matzo. Melba toast. Zwieback. Vegetables Potatoes (without the skin). Most well-cooked and canned vegetables without skins or seeds. Tender lettuce. Fruits Apple sauce. Fruits canned in juice. Cooked apricots, cherries, grapefruit, peaches, pears, or plums. Fresh bananas and cantaloupe. Meats and other protein foods Baked or boiled chicken. Eggs. Tofu. Fish. Seafood. Smooth nut butters. Ground or well-cooked tender beef, ham, veal, lamb, pork, or poultry. Dairy Plain yogurt, kefir, and unsweetened liquid yogurt. Lactose-free milk, buttermilk, skim milk, or soy milk. Low-fat or nonfat hard cheese. Beverages Water. Low-calorie sports drinks. Fruit juices without pulp. Strained tomato and vegetable juices. Decaffeinated teas. Sugar-free beverages not sweetened with sugar alcohols. Oral rehydration solutions, if approved by your health care provider. Seasoning and other foods Bouillon, broth, or soups made from recommended foods. What foods are not recommended? The items listed may not be a complete list. Talk with your health care provider about what dietary choices are best for you. Grains Whole grain, whole wheat, bran, or rye breads,  rolls, pastas, and crackers. Wild or brown rice. Whole grain or bran cereals. Barley. Oats and oatmeal. Corn tortillas or taco  shells. Granola. Popcorn. Vegetables Raw vegetables. Fried vegetables. Cabbage, broccoli, Brussels sprouts, artichokes, baked beans, beet greens, corn, kale, legumes, peas, sweet potatoes, and yams. Potato skins. Cooked spinach and cabbage. Fruits Dried fruit, including raisins and dates. Raw fruits. Stewed or dried prunes. Canned fruits with syrup. Meat and other protein foods Fried or fatty meats. Deli meats. Chunky nut butters. Nuts and seeds. Beans and lentils. Tomasa Blase. Hot dogs. Sausage. Dairy High-fat cheeses. Whole milk, chocolate milk, and beverages made with milk, such as milk shakes. Half-and-half. Cream. sour cream. Ice cream. Beverages Caffeinated beverages (such as coffee, tea, soda, or energy drinks). Alcoholic beverages. Fruit juices with pulp. Prune juice. Soft drinks sweetened with high-fructose corn syrup or sugar alcohols. High-calorie sports drinks. Fats and oils Butter. Cream sauces. Margarine. Salad oils. Plain salad dressings. Olives. Avocados. Mayonnaise. Sweets and desserts Sweet rolls, doughnuts, and sweet breads. Sugar-free desserts sweetened with sugar alcohols such as xylitol and sorbitol. Seasoning and other foods Honey. Hot sauce. Chili powder. Gravy. Cream-based or milk-based soups. Pancakes and waffles. Summary  When you have diarrhea, the foods you eat and your eating habits are very important.  Make sure you get at least 8-10 cups of fluid each day, or enough to keep your urine clear or pale yellow.  Eat bland foods and gradually reintroduce healthy, nutrient-rich foods as tolerated, or as told by your health care provider.  Avoid high-fiber, fried, greasy, or spicy foods. This information is not intended to replace advice given to you by your health care provider. Make sure you discuss any questions you have with your health care provider. Document Released: 10/23/2003 Document Revised: 07/30/2016 Document Reviewed: 07/30/2016 Elsevier Interactive Patient  Education  2019 [employer].

## 2018-11-17 NOTE — MAU Note (Addendum)
Presents with c/o N/V, H/A, and abdominal pain.  Reports N/V since 0200 this morning, abd pain began when N/V started.  Reports had spotting @ 0400 this morning, now resolved.  Reports took Tylenol without relief.

## 2018-11-17 NOTE — MAU Provider Note (Signed)
History     CSN: [id number]  Arrival date and time: 11/17/18 1001   First Provider Initiated Contact with Patient 11/17/18 1039      Chief Complaint  Patient presents with  . Abdominal Pain  . Headache  . Nausea  . Emesis   Deanna Sullivan is a 23 y.o. G3P2002 at [redacted]w[redacted]d who presents today with nausea and vomiting since 0200 today. She reports that this has been an ongoing issue, and she had medication. However, she has not been taking any medication because it made her really sleepy.   Abdominal Pain  Associated symptoms include vomiting. Pertinent negatives include no diarrhea, dysuria, fever or frequency.  Emesis   This is a new problem. The current episode started today. The problem occurs 5 to 10 times per day. The problem has been unchanged. The emesis has an appearance of stomach contents. There has been no fever. Associated symptoms include abdominal pain. Pertinent negatives include no chills, diarrhea or fever. Risk factors: pregnancy  She has tried nothing for the symptoms.    OB History    Gravida  3   Para  2   Term  2   Preterm      AB      Living  2     SAB      TAB      Ectopic      Multiple  0   Live Births  2           Past Medical History:  Diagnosis Date  . [redacted] weeks gestation of pregnancy   . Acute pyelonephritis   . Acute renal failure syndrome (HCC)   . Acute respiratory failure with hypoxia (HCC)   . Anemia   . Aortic dissection (HCC)   . Back pain   . Chlamydia   . E coli bacteremia   . Encounter for fetal anatomic survey   . Fetal size inconsistent with dates   . Fever and chills   . Hx of postpartum hemorrhage, currently pregnant   . Hypokalemia   . Hypomagnesemia   . IUGR, antenatal   . Leucocytosis   . Metabolic acidosis   . Normocytic anemia   . NSVD (normal spontaneous vaginal delivery)   . Peripartum cardiomyopathy   . Poor fetal growth, affecting management of mother, third trimester, not applicable or  unspecified fetus   . Postpartum endometritis   . Pyelonephritis   . Retained placenta with hemorrhage, postpartum condition   . Shock, septic (HCC)   . Tachycardia, unspecified     Past Surgical History:  Procedure Laterality Date  . DILATION AND EVACUATION N/A 04/22/2015   Procedure: DILATATION AND EVACUATION;  Surgeon: Adam Phenix, MD;  Location: [facility];  Service: Gynecology;  Laterality: N/A;    Family History  Problem Relation Age of Onset  . Cancer Mother   . Cancer Maternal Aunt     Social History   Tobacco Use  . Smoking status: Former Smoker    Packs/day: 0.50    Types: Cigarettes    Last attempt to quit: 04/18/2017    Years since quitting: 1.5  . Smokeless tobacco: Never Used  Substance Use Topics  . Alcohol use: No    Comment: occassionally  . Drug use: No    Allergies: No Known Allergies  Medications Prior to Admission  Medication Sig Dispense Refill Last Dose  . acetaminophen (TYLENOL) 325 MG tablet Take 325-650 mg by mouth every 6 (six) hours as needed (  for pain).   11/17/2018 at Unknown time  . cephALEXin (KEFLEX) 500 MG capsule Take 1 capsule (500 mg total) by mouth 4 (four) times daily. 28 capsule 0   . promethazine (PHENERGAN) 25 MG tablet Take 1 tablet (25 mg total) by mouth every 6 (six) hours as needed for nausea or vomiting. 30 tablet 2     Review of Systems  Constitutional: Negative for chills and fever.  Gastrointestinal: Positive for abdominal pain and vomiting. Negative for diarrhea.  Genitourinary: Negative for dysuria, frequency, pelvic pain, vaginal bleeding and vaginal discharge.   Physical Exam   Blood pressure 105/60, pulse 79, temperature 98.3 F (36.8 C), temperature source Oral, resp. rate 20, last menstrual period 07/25/2018, SpO2 98 %, not currently breastfeeding.  Physical Exam  Nursing note and vitals reviewed. Constitutional: She is oriented to person, place, and time. She appears well-developed and well-nourished. No  distress.  HENT:  Head: Normocephalic.  Cardiovascular: Normal rate.  Respiratory: Effort normal.  GI: Soft. There is no abdominal tenderness. There is no rebound.  Neurological: She is alert and oriented to person, place, and time.  Skin: Skin is warm and dry.  Psychiatric: She has a normal mood and affect.   FHT 149 with doppler   Results for orders placed or performed during the hospital encounter of 11/17/18 (from the past 24 hour(s))  Urinalysis, Routine w reflex microscopic     Status: Abnormal   Collection Time: 11/17/18 10:20 AM  Result Value Ref Range   Color, Urine YELLOW YELLOW   APPearance HAZY (A) CLEAR   Specific Gravity, Urine 1.020 1.005 - 1.030   pH 6.5 5.0 - 8.0   Glucose, UA NEGATIVE NEGATIVE mg/dL   Hgb urine dipstick NEGATIVE NEGATIVE   Bilirubin Urine NEGATIVE NEGATIVE   Ketones, ur 15 (A) NEGATIVE mg/dL   Protein, ur NEGATIVE NEGATIVE mg/dL   Nitrite NEGATIVE NEGATIVE   Leukocytes,Ua NEGATIVE NEGATIVE     MAU Course  Procedures  MDM 1:30 PM patient has had IV fluids and zofran. She reports that she is feeling better, and is tolerating PO at this time.   Assessment and Plan   1. Nausea/vomiting in pregnancy   2. [redacted] weeks gestation of pregnancy   3. Supervision of other normal pregnancy, antepartum    DC home Comfort measures reviewed  2nd Trimester precautions  RX: zofran PRN #[id number]  Return to MAU as needed FU with OB as planned  Follow-up Information    Center for [facility] Follow up.   Specialty:  Obstetrics and Gynecology Contact information: [address] [place] [address] [phone]

## 2019-01-01 ENCOUNTER — Telehealth: Payer: Self-pay | Admitting: Obstetrics and Gynecology

## 2019-01-01 NOTE — Telephone Encounter (Signed)
Called the patient to inform of upcoming appointment and COVID19 restrictions. Also educated of wearing a face mask to the visit and our new office location. The patient verbalized understanding.

## 2019-01-02 ENCOUNTER — Other Ambulatory Visit: Payer: Self-pay

## 2019-01-02 ENCOUNTER — Ambulatory Visit (INDEPENDENT_AMBULATORY_CARE_PROVIDER_SITE_OTHER): Payer: Medicaid Other | Admitting: Student

## 2019-01-02 ENCOUNTER — Encounter: Payer: Self-pay | Admitting: Student

## 2019-01-02 DIAGNOSIS — Z3482 Encounter for supervision of other normal pregnancy, second trimester: Secondary | ICD-10-CM | POA: Diagnosis not present

## 2019-01-02 DIAGNOSIS — Z3480 Encounter for supervision of other normal pregnancy, unspecified trimester: Secondary | ICD-10-CM

## 2019-01-02 DIAGNOSIS — Z3A23 23 weeks gestation of pregnancy: Secondary | ICD-10-CM

## 2019-01-02 MED ORDER — AMBULATORY NON FORMULARY MEDICATION: 1.0000 | 0 refills | Status: DC

## 2019-01-02 MED ORDER — NITROFURANTOIN MONOHYD MACRO 100 MG PO CAPS: 100.0000 mg | ORAL_CAPSULE | Freq: Every day | ORAL | 2 refills | Status: DC

## 2019-01-02 NOTE — Progress Notes (Addendum)
  Subjective:    Deanna Sullivan is being seen today for her first obstetrical visit.  This is not a planned pregnancy. She is at [redacted]w[redacted]d gestation. Her obstetrical history is significant for pylenephritis with sepsis, hypoxia, cardiomyopathy, and recurrent UTI.   Also had retained POCs. See history for full description. . Relationship with FOB: significant other, living together. Patient does intend to breast feed. Pregnancy history fully reviewed.  Patient reports history of cardiomyopathy and  pyelenephritis; was on ventilator for three days. This was in 2016. Marland Kitchen  She feels fine today; her second pregnancy was an uncomplicated. No C-sections in the past. No GDM or BP in the past. After her last pregnancy, May 2019, she had another UTI in July 2019, which resulted in IV antibiotics and hospital admission.   Review of Systems:   Review of Systems  Constitutional: Negative.   HENT: Negative.   Respiratory: Negative.   Cardiovascular: Negative.   Gastrointestinal: Negative.   Genitourinary: Negative.   Musculoskeletal: Negative.     Objective:     BP (!) 111/58   Pulse 72   Temp 98.7 F (37.1 C)   Wt 116 lb 6.4 oz (52.8 kg)   LMP 07/25/2018   BMI 19.98 kg/m  Physical Exam  Constitutional: She appears well-developed.  HENT:  Head: Normocephalic.  Neck: Normal range of motion.  Respiratory: Effort normal.  GI: Soft.  Musculoskeletal: Normal range of motion.  Neurological: She is alert.  Skin: Skin is warm.    Exam    Assessment:    Pregnancy: [id number] Patient Active Problem List   Diagnosis Date Noted  . Supervision of other normal pregnancy, antepartum 01/02/2019  . Sepsis due to Escherichia coli (HCC)   . Pyelonephritis 02/15/2018  . Normal labor 01/02/2018  . SVD (spontaneous vaginal delivery) 01/02/2018  . History of maternal cardiomyopathy, currently pregnant 08/23/2017  . History of postpartum hemorrhage, currently pregnant 08/02/2017  . Chlamydia infection,  current pregnancy 08/02/2017  . Retained products of conception after delivery with complications        Plan:     Initial labs drawn. Prenatal vitamins. Problem list reviewed and updated. AFP3 discussed: declined but wants to do other genetic testing Role of ultrasound in pregnancy discussed; fetal survey: ordered. Amniocentesis discussed: declined. Follow up in 4 weeks. 50 % of 30 min visit spent on counseling and coordination of care.  -[place] ordered -Patient will download BabyRx and start taking BP; BP cuff ordered.  -Next in person visit will include 2 hour GTT; Patient to see High Risk OBMD at next visit.  -Reviewed Hemoglobinopathy; patient negative for Sickle Cell trait, which is a potential cause of frequent UTI. -Will start on Macrobid; message sent to patient on My Chart that Rx is waiting for her at Pharmacy.  -wants Depo for [facility] -Pap normal 07-2017  Charlesetta Garibaldi [facility] 01/02/2019

## 2019-01-02 NOTE — Patient Instructions (Signed)
Pregnancy and Urinary Tract Infection  What is a urinary tract infection?    A urinary tract infection (UTI) is an infection of any part of the urinary tract. This includes the kidneys, the tubes that connect your kidneys to your bladder (ureters), the bladder, and the tube that carries urine out of your body (urethra). These organs make, store, and get rid of urine in the body.   An upper UTI affects the ureters and kidneys (pyelonephritis), and a lower UTI affects the bladder (cystitis) and urethra (urethritis). Most urinary tract infections are caused by bacteria in your genital area, around the entrance to your urinary tract (urethra). These bacteria grow and cause irritation and inflammation of your urinary tract.  Why am I more likely to get a UTI during pregnancy?  You are more likely to develop a UTI during pregnancy because:  · The physical and hormonal changes your body goes through can make it easier for bacteria to get into your urinary tract.  · Your growing baby puts pressure on your uterus and can affect urine flow.  Does a UTI place my baby at risk?  An untreated UTI during pregnancy could lead to a kidney infection, which can cause health problems that could affect your baby. Possible complications of an untreated UTI include:  · Having your baby before 37 weeks of pregnancy (premature).  · Having a baby with a low birth weight.  · Developing high blood pressure during pregnancy (preeclampsia).  · Having a low hemoglobin level (anemia).  What are the symptoms of a UTI?  Symptoms of a UTI include:  · Needing to urinate right away (urgently).  · Frequent urination or passing small amounts of urine frequently.  · Pain or burning with urination.  · Blood in the urine.  · Urine that smells bad or unusual.  · Trouble urinating.  · Cloudy urine.  · Pain in the abdomen or lower back.  · Vaginal discharge.  You may also have:  · Vomiting or a decreased appetite.  · Confusion.  · Irritability or  tiredness.  · A fever.  · Diarrhea.  What are the treatment options for a UTI during pregnancy?  Treatment for this condition may include:  · Antibiotic medicines that are safe to take during pregnancy.  · Other medicines to treat less common causes of UTI.  How can I prevent a UTI?  To prevent a UTI:  · Go to the bathroom as soon as you feel the need. Do not hold urine for long periods of time.  · Always wipe from front to back after a bowel movement. Use each tissue one time when you wipe.  · Empty your bladder after sex.  · Keep your genital area dry.  · Drink 6-10 glasses of water each day.  · Do not douche or use deodorant sprays.  Contact a health care provider if:  · Your symptoms do not improve or they get worse.  · You have abnormal vaginal discharge.  Get help right away if:  · You have a fever.  · You have nausea and vomiting.  · You have back or side pain.  · You feel contractions in your uterus.  · You have lower belly pain.  · You have a gush of fluid from your vagina.  · You have blood in your urine.  Summary  · A urinary tract infection (UTI) is an infection of any part of the urinary tract, which includes the kidneys, ureters,   bladder, and urethra.  · Most urinary tract infections are caused by bacteria in your genital area, around the entrance to your urinary tract (urethra).  · You are more likely to develop a UTI during pregnancy.  · If you were prescribed an antibiotic, take it as told by your health care provider. Do not stop taking the antibiotic even if you start to feel better.  This information is not intended to replace advice given to you by your health care provider. Make sure you discuss any questions you have with your health care provider.  Document Released: 11/27/2010 Document Revised: 09/27/2017 Document Reviewed: 06/23/2015  Elsevier Interactive Patient Education © 2019 [employer]

## 2019-01-02 NOTE — Progress Notes (Signed)
Medicaid Home form Completed-01/02/2019

## 2019-01-04 LAB — CULTURE, OB URINE

## 2019-01-04 LAB — URINE CULTURE, OB REFLEX

## 2019-01-05 ENCOUNTER — Ambulatory Visit (HOSPITAL_COMMUNITY)
Admission: RE | Admit: 2019-01-05 | Discharge: 2019-01-05 | Disposition: A | Discharge status: Home / Self Care | Payer: Medicaid Other | Source: Ambulatory Visit | Attending: Obstetrics and Gynecology | Admitting: Obstetrics and Gynecology

## 2019-01-05 ENCOUNTER — Other Ambulatory Visit: Payer: Self-pay

## 2019-01-05 DIAGNOSIS — Z3A22 22 weeks gestation of pregnancy: Secondary | ICD-10-CM

## 2019-01-05 DIAGNOSIS — Z363 Encounter for antenatal screening for malformations: Secondary | ICD-10-CM | POA: Diagnosis not present

## 2019-01-05 DIAGNOSIS — Z3480 Encounter for supervision of other normal pregnancy, unspecified trimester: Secondary | ICD-10-CM | POA: Diagnosis not present

## 2019-01-11 DIAGNOSIS — Z3490 Encounter for supervision of normal pregnancy, unspecified, unspecified trimester: Secondary | ICD-10-CM | POA: Diagnosis not present

## 2019-01-16 LAB — OBSTETRIC PANEL, INCLUDING HIV
Basophils Absolute: 0 10*3/uL (ref 0.0–0.2)
Basos: 1 %
EOS (ABSOLUTE): 0.1 10*3/uL (ref 0.0–0.4)
Eos: 2 %
HIV Screen 4th Generation wRfx: NONREACTIVE
Hematocrit: 29.2 % — ABNORMAL LOW (ref 34.0–46.6)
Hemoglobin: 9.7 g/dL — ABNORMAL LOW (ref 11.1–15.9)
Hepatitis B Surface Ag: NEGATIVE
Immature Grans (Abs): 0 10*3/uL (ref 0.0–0.1)
Immature Granulocytes: 0 %
Lymphocytes Absolute: 1.6 10*3/uL (ref 0.7–3.1)
Lymphs: 25 %
MCH: 28 pg (ref 26.6–33.0)
MCHC: 33.2 g/dL (ref 31.5–35.7)
MCV: 84 fL (ref 79–97)
Monocytes Absolute: 0.4 10*3/uL (ref 0.1–0.9)
Monocytes: 6 %
Neutrophils Absolute: 4.3 10*3/uL (ref 1.4–7.0)
Neutrophils: 66 %
Platelets: 285 10*3/uL (ref 150–450)
RBC: 3.46 x10E6/uL — ABNORMAL LOW (ref 3.77–5.28)
RDW: 14.2 % (ref 11.7–15.4)
RPR Ser Ql: NONREACTIVE
Rh Factor: POSITIVE
Rubella Antibodies, IGG: 3.82 index (ref >0.99)
WBC: 6.4 10*3/uL (ref 3.4–10.8)

## 2019-01-16 LAB — INHERITEST(R) CF/SMA PANEL

## 2019-01-16 LAB — AB SCR+ANTIBODY ID: Antibody Screen: POSITIVE — AB

## 2019-01-18 ENCOUNTER — Other Ambulatory Visit: Payer: Self-pay | Admitting: Student

## 2019-01-18 DIAGNOSIS — D649 Anemia, unspecified: Secondary | ICD-10-CM | POA: Insufficient documentation

## 2019-01-18 MED ORDER — FERROUS FUMARATE 325 (106 FE) MG PO TABS: 1.0000 | ORAL_TABLET | Freq: Every day | ORAL | 4 refills | Status: DC

## 2019-02-06 ENCOUNTER — Encounter: Payer: Medicaid Other | Admitting: Obstetrics and Gynecology

## 2019-02-06 ENCOUNTER — Other Ambulatory Visit: Payer: Self-pay | Admitting: General Practice

## 2019-02-06 ENCOUNTER — Other Ambulatory Visit: Payer: Medicaid Other

## 2019-02-06 ENCOUNTER — Encounter: Payer: Self-pay | Admitting: Obstetrics and Gynecology

## 2019-02-06 DIAGNOSIS — Z3480 Encounter for supervision of other normal pregnancy, unspecified trimester: Secondary | ICD-10-CM

## 2019-02-06 NOTE — Progress Notes (Signed)
Patient did not keep her OB appointment for 02/06/2019.  Durene Romans MD Attending Center for [employer] [facility])

## 2019-02-07 ENCOUNTER — Telehealth: Payer: Self-pay | Admitting: Obstetrics & Gynecology

## 2019-02-07 NOTE — Telephone Encounter (Signed)
Patient was called and instructed about her visit for 02/08/2019. °

## 2019-02-08 ENCOUNTER — Other Ambulatory Visit: Payer: Self-pay | Admitting: General Practice

## 2019-02-08 ENCOUNTER — Encounter: Payer: Medicaid Other | Admitting: Obstetrics & Gynecology

## 2019-02-08 ENCOUNTER — Encounter: Payer: Self-pay | Admitting: Obstetrics & Gynecology

## 2019-02-08 ENCOUNTER — Telehealth: Payer: Self-pay | Admitting: Obstetrics & Gynecology

## 2019-02-08 ENCOUNTER — Other Ambulatory Visit: Payer: Medicaid Other

## 2019-02-08 DIAGNOSIS — Z3480 Encounter for supervision of other normal pregnancy, unspecified trimester: Secondary | ICD-10-CM

## 2019-02-08 NOTE — Telephone Encounter (Signed)
Patient was called because she missed her appointment. She stated she has been throwing up. Will get her rescheduled.

## 2019-02-15 ENCOUNTER — Telehealth: Payer: Self-pay | Admitting: Family Medicine

## 2019-02-15 NOTE — Telephone Encounter (Signed)
Spoke with patient about her appointment on 7/6 @ 8:50. Patient was instructed to wear a face mask and no visitors are allowed with her. Patient was screened for covid symptoms and denied having any. Patient instructed to come fasting.

## 2019-02-19 ENCOUNTER — Telehealth: Payer: Self-pay | Admitting: Obstetrics & Gynecology

## 2019-02-19 ENCOUNTER — Other Ambulatory Visit: Payer: Medicaid Other

## 2019-02-19 ENCOUNTER — Encounter: Payer: Self-pay | Admitting: Obstetrics & Gynecology

## 2019-02-19 NOTE — Telephone Encounter (Signed)
Called the patient to inform of the missed appointment. I identified myself and informed the patient Im calling in regard to the missed appointment. She proceeded to state they are about to make me mad and hung up. I called back and received a voicemail. Informed the patient of the missed appointment and option to reschedule. Mailing a missed appointment letter.

## 2019-02-21 ENCOUNTER — Telehealth: Payer: Self-pay | Admitting: Obstetrics & Gynecology

## 2019-02-21 NOTE — Telephone Encounter (Signed)
Called the patient to inform of upcoming appointment. Left a detailed voicemail of the location, appointment time and date. Also informed the patient if they’ve been in close contact with someone or diagnosed with Covid19 in the previous 14-day period please call to reschedule. If the patient has also experience flu-like symptoms such as sore throat, fever, and/or shortness of breath. Please call our office to reschedule. In addition, please be sure to wear a face mask to the visit and sanitize hands upon entering the office. Also, no children or visitors or allowed due to Covid19 restrictions. If you have any questions or concerns, please contact our office. °

## 2019-02-22 ENCOUNTER — Encounter: Payer: Medicaid Other | Admitting: Internal Medicine

## 2019-03-09 ENCOUNTER — Telehealth: Payer: Self-pay | Admitting: Family Medicine

## 2019-03-09 NOTE — Telephone Encounter (Signed)
The patient called in to schedule an appointment, informed the patient of the covid19 restrictions as well as if she has any symptoms within 10 days of the appointment please call to reschedule.

## 2019-03-15 ENCOUNTER — Other Ambulatory Visit: Payer: Medicaid Other

## 2019-03-15 ENCOUNTER — Encounter: Payer: Medicaid Other | Admitting: Obstetrics & Gynecology

## 2019-03-23 ENCOUNTER — Other Ambulatory Visit: Payer: Self-pay

## 2019-03-23 ENCOUNTER — Ambulatory Visit (INDEPENDENT_AMBULATORY_CARE_PROVIDER_SITE_OTHER): Payer: Medicaid Other | Admitting: Family Medicine

## 2019-03-23 ENCOUNTER — Encounter: Payer: Self-pay | Admitting: Family Medicine

## 2019-03-23 VITALS — BP 118/68 | HR 75 | Temp 98.4°F | Wt 124.4 lb

## 2019-03-23 DIAGNOSIS — O361930 Maternal care for other isoimmunization, third trimester, not applicable or unspecified: Secondary | ICD-10-CM | POA: Insufficient documentation

## 2019-03-23 DIAGNOSIS — Z3480 Encounter for supervision of other normal pregnancy, unspecified trimester: Secondary | ICD-10-CM

## 2019-03-23 DIAGNOSIS — Z3A34 34 weeks gestation of pregnancy: Secondary | ICD-10-CM

## 2019-03-23 DIAGNOSIS — O365930 Maternal care for other known or suspected poor fetal growth, third trimester, not applicable or unspecified: Secondary | ICD-10-CM | POA: Diagnosis not present

## 2019-03-23 NOTE — Progress Notes (Signed)
    PRENATAL VISIT NOTE  Subjective:  Deanna Sullivan is a 23 y.o. G3P2002 at [redacted]w[redacted]d being seen today for ongoing prenatal care.  She is currently monitored for the following issues for this high-risk pregnancy and has History of postpartum hemorrhage, currently pregnant; Chlamydia infection, current pregnancy; History of maternal cardiomyopathy, currently pregnant; Pyelonephritis; Sepsis due to Escherichia coli [facility]); Supervision of other normal pregnancy, antepartum; and Anemia on their problem list.  Patient reports no complaints.  Contractions: Not present. Vag. Bleeding: None.  Movement: Present. Denies leaking of fluid.   The following portions of the patient's history were reviewed and updated as appropriate: allergies, current medications, past family history, past medical history, past social history, past surgical history and problem list.   Objective:   Vitals:   03/23/19 1001  BP: 118/68  Pulse: 75  Temp: 98.4 F (36.9 C)  Weight: 124 lb 6.4 oz (56.4 kg)    Fetal Status: Fetal Heart Rate (bpm): 145=6 Fundal Height: 30 cm Movement: Present     General:  Alert, oriented and cooperative. Patient is in no acute distress.  Skin: Skin is warm and dry. No rash noted.   Cardiovascular: Normal heart rate noted  Respiratory: Normal respiratory effort, no problems with respiration noted  Abdomen: Soft, gravid, appropriate for gestational age.  Pain/Pressure: Present     Pelvic: Cervical exam deferred        Extremities: Normal range of motion.  Edema: None  Mental Status: Normal mood and affect. Normal behavior. Normal judgment and thought content.   Assessment and Plan:  Pregnancy: G3P2002 at [redacted]w[redacted]d 1. Supervision of other normal pregnancy, antepartum 28 wk labs - CBC; Future - Glucose Tolerance, 2 Hours w/1 Hour; Future - HIV Antibody (routine testing w rflx); Future - RPR; Future  2. Maternal atypical antibody affecting pregnancy in third trimester, single or unspecified  fetus Check titers - Antibody screen; Future  3. Intrauterine growth restriction (IUGR) affecting care of mother, third trimester, single or unspecified fetus Check u/s - [place] MFM OB FOLLOW UP; Future   Preterm labor symptoms and general obstetric precautions including but not limited to vaginal bleeding, contractions, leaking of fluid and fetal movement were reviewed in detail with the patient. Please refer to After Visit Summary for other counseling recommendations.   Return in 2 weeks (on 04/06/2019) for in person, HRC, 28 wk labs with GTT.  Future Appointments  Date Time Provider [place]  03/29/2019  3:30 PM [place] 1 [facility]-M[facility]US MFC-US  04/09/2019 11:15 AM Donnamae Jude, MD [facility]  04/16/2019 10:15 AM Aletha Halim, MD [place]  04/25/2019  2:15 PM Aletha Halim, MD [place] [facility]  05/02/2019  2:15 PM Aletha Halim, MD [facility]    Donnamae Jude, MD

## 2019-03-23 NOTE — Patient Instructions (Signed)

## 2019-03-29 ENCOUNTER — Other Ambulatory Visit: Payer: Self-pay | Admitting: Family Medicine

## 2019-03-29 ENCOUNTER — Other Ambulatory Visit: Payer: Self-pay

## 2019-03-29 ENCOUNTER — Ambulatory Visit (HOSPITAL_COMMUNITY)
Admission: RE | Admit: 2019-03-29 | Discharge: 2019-03-29 | Disposition: A | Discharge status: Home / Self Care | Payer: Medicaid Other | Source: Ambulatory Visit | Attending: Obstetrics and Gynecology | Admitting: Obstetrics and Gynecology

## 2019-03-29 DIAGNOSIS — O365930 Maternal care for other known or suspected poor fetal growth, third trimester, not applicable or unspecified: Secondary | ICD-10-CM | POA: Insufficient documentation

## 2019-03-29 DIAGNOSIS — Z362 Encounter for other antenatal screening follow-up: Secondary | ICD-10-CM

## 2019-03-29 DIAGNOSIS — Z3A34 34 weeks gestation of pregnancy: Secondary | ICD-10-CM

## 2019-03-29 NOTE — Addendum Note (Signed)
Addended by: Donnamae Jude on: 03/29/2019 05:51 PM   Modules accepted: Orders, SmartSet

## 2019-03-30 ENCOUNTER — Other Ambulatory Visit (HOSPITAL_COMMUNITY): Payer: Self-pay | Admitting: *Deleted

## 2019-03-30 DIAGNOSIS — O365930 Maternal care for other known or suspected poor fetal growth, third trimester, not applicable or unspecified: Secondary | ICD-10-CM

## 2019-03-31 ENCOUNTER — Encounter (HOSPITAL_COMMUNITY): Payer: Self-pay | Admitting: *Deleted

## 2019-03-31 ENCOUNTER — Other Ambulatory Visit: Payer: Self-pay

## 2019-03-31 ENCOUNTER — Inpatient Hospital Stay (HOSPITAL_COMMUNITY)
Admission: AD | Admit: 2019-03-31 | Discharge: 2019-03-31 | Disposition: A | Discharge status: Home / Self Care | Payer: Medicaid Other | Attending: Obstetrics and Gynecology | Admitting: Obstetrics and Gynecology

## 2019-03-31 DIAGNOSIS — Z87891 Personal history of nicotine dependence: Secondary | ICD-10-CM | POA: Insufficient documentation

## 2019-03-31 DIAGNOSIS — Z3A34 34 weeks gestation of pregnancy: Secondary | ICD-10-CM | POA: Diagnosis not present

## 2019-03-31 DIAGNOSIS — Z0371 Encounter for suspected problem with amniotic cavity and membrane ruled out: Secondary | ICD-10-CM

## 2019-03-31 DIAGNOSIS — O4703 False labor before 37 completed weeks of gestation, third trimester: Secondary | ICD-10-CM

## 2019-03-31 DIAGNOSIS — O479 False labor, unspecified: Secondary | ICD-10-CM

## 2019-03-31 LAB — URINALYSIS, ROUTINE W REFLEX MICROSCOPIC
Bilirubin Urine: NEGATIVE
Glucose, UA: NEGATIVE mg/dL
Hgb urine dipstick: NEGATIVE
Ketones, ur: 5 mg/dL — AB
Leukocytes,Ua: NEGATIVE
Nitrite: NEGATIVE
Protein, ur: NEGATIVE mg/dL
Specific Gravity, Urine: 1.005 (ref 1.005–1.030)
pH: 7 (ref 5.0–8.0)

## 2019-03-31 LAB — WET PREP, GENITAL
Sperm: NONE SEEN
Trich, Wet Prep: NONE SEEN
Yeast Wet Prep HPF POC: NONE SEEN

## 2019-03-31 LAB — POCT FERN TEST: POCT Fern Test: NEGATIVE

## 2019-03-31 NOTE — MAU Note (Signed)
Deanna Sullivan is a 23 y.o. at [redacted]w[redacted]d here in MAU reporting: states she has been having pain for the last hour, has had at least 4 contractions and is also having pressure. No bleeding. Reporting some LOF since earlier today, it's clear, states it happened once. +FM. States the baby is small.   Onset of complaint: an hour  Pain score: 8/10  Vitals:   03/31/19 1551  BP: 96/67  Pulse: 80  Resp: 18  Temp: 98.7 F (37.1 C)  SpO2: 100%     FHT: +FM  Lab orders placed from triage: UA

## 2019-03-31 NOTE — Discharge Instructions (Signed)

## 2019-03-31 NOTE — MAU Provider Note (Signed)
Chief Complaint:  Abdominal Pain and Rupture of Membranes   First Provider Initiated Contact with Patient 03/31/19 1629     HPI: Deanna Sullivan is a 23 y.o. G3P2002 at 65w4dwho presents to maternity admissions reporting contractions & leaking. Reports feeling 4 painful contractions in the last hour. Also had 1 episode of leaking earlier today which has not continued. Denies vaginal bleeding. Denies n/v/d. No recent intercourse. Pregnancy complicated by IUGR with plans for delivery at 37 wks. Patient has missed several OB appointments but had her last ultrasound 2 days ago which showed a BPP of 8/8 & normal dopplers.  Good fetal movement.   Location: abdomen Quality: contractions Severity: 8/10 in pain scale Duration: 2 hours Timing: intermittent Modifying factors: none Associated signs and symptoms: vaginal discharge  Past Medical History:  Diagnosis Date  . [redacted] weeks gestation of pregnancy   . Acute pyelonephritis   . Acute renal failure syndrome ([place]   . Acute respiratory failure with hypoxia ([place]   . Anemia   . Aortic dissection ([place]   . Back pain   . Chlamydia   . E coli bacteremia   . Encounter for fetal anatomic survey   . Fetal size inconsistent with dates   . Fever and chills   . Hx of postpartum hemorrhage, currently pregnant   . Hypokalemia   . Hypomagnesemia   . IUGR, antenatal   . Leucocytosis   . Metabolic acidosis   . Normocytic anemia   . NSVD (normal spontaneous vaginal delivery)   . Peripartum cardiomyopathy   . Poor fetal growth, affecting management of mother, third trimester, not applicable or unspecified fetus   . Postpartum endometritis   . Pyelonephritis   . Retained placenta with hemorrhage, postpartum condition   . Shock, septic ([place]   . Tachycardia, unspecified    OB History  Gravida Para Term Preterm AB Living  _0 SAB TAB Ectopic Multiple Live Births        0 2    # Outcome Date GA Lbr Len/2nd Weight Sex Delivery Anes PTL Lv   3 Current           2 Term 01/02/18 485w0d2:20 / 00:15 3084 g M Vag-Spont None  LIV  1 Term 04/15/15 4060w4d:10 / 00:15 2870 g M Vag-Spont Local  LIV   Past Surgical History:  Procedure Laterality Date  . DILATION AND EVACUATION N/A 04/22/2015   Procedure: DILATATION AND EVACUATION;  Surgeon: JamWoodroe ModeD;  [facility];  Service: Gynecology;  Laterality: N/A;   Family History  Problem Relation Age of Onset  . Cancer Mother   . Cancer Maternal Aunt    Social History   Tobacco Use  . Smoking status: Former Smoker    Packs/day: 0.50    Types: Cigarettes    Quit date: 04/18/2017    Years since quitting: 1.9  . Smokeless tobacco: Never Used  Substance Use Topics  . Alcohol use: No    Comment: occassionally  . Drug use: No   No Known Allergies Medications Prior to Admission  Medication Sig Dispense Refill Last Dose  . acetaminophen (TYLENOL) 325 MG tablet Take 325-650 mg by mouth every 6 (six) hours as needed (for pain).     . AMBULATORY NON FORMULARY MEDICATION 1 Device by Other route once a week. Medication Name: Blood Pressure Cuff-Small Monitored regularly at home ICD-10: Z34.90 for LROB 1 kit 0   .  ferrous fumarate (HEMOCYTE - 106 MG FE) 325 (106 Fe) MG TABS tablet Take 1 tablet (106 mg of iron total) by mouth daily. 30 each 4   . nitrofurantoin, macrocrystal-monohydrate, (MACROBID) 100 MG capsule Take 1 capsule (100 mg total) by mouth at bedtime. 90 capsule 2   . ondansetron (ZOFRAN ODT) 8 MG disintegrating tablet Take 1 tablet (8 mg total) by mouth every 8 (eight) hours as needed for nausea or vomiting. (Patient not taking: Reported on 03/23/2019) 20 tablet 0   . Prenatal Vit-Fe Fumarate-FA (PRENATAL MULTIVITAMIN) TABS tablet Take 1 tablet by mouth daily at 12 noon.       I have reviewed patient's Past Medical Hx, Surgical Hx, Family Hx, Social Hx, medications and allergies.   ROS:  Review of Systems  Constitutional: Negative.   Gastrointestinal: Positive for  abdominal pain. Negative for constipation, diarrhea, nausea and vomiting.  Genitourinary: Positive for vaginal discharge. Negative for dysuria and vaginal bleeding.    Physical Exam   Patient Vitals for the past 24 hrs:  BP Temp Temp src Pulse Resp SpO2 Weight  03/31/19 1551 96/67 98.7 F (37.1 C) Oral 80 18 100 % -    Constitutional: Well-developed, well-nourished female in no acute distress.  Cardiovascular: normal rate & rhythm, no murmur Respiratory: normal effort, lung sounds clear throughout GI: Abd soft, non-tender, gravid appropriate for gestational age. Pos BS x 4 MS: Extremities nontender, no edema, normal ROM Neurologic: Alert and oriented x 4.  GU:     No pooling. No blood  Dilation: 1 Effacement (%): 50 Station: Ballotable Exam by:: Jorje Guild, NP  NST:  Baseline: 140 bpm, Variability: Good {> 6 bpm), Accelerations: Reactive and Decelerations: Absent   Labs: Results for orders placed or performed during the hospital encounter of 03/31/19 (from the past 24 hour(s))  Urinalysis, Routine w reflex microscopic     Status: Abnormal   Collection Time: 03/31/19  4:06 PM  Result Value Ref Range   Color, Urine STRAW (A) YELLOW   APPearance CLEAR CLEAR   Specific Gravity, Urine 1.005 1.005 - 1.030   pH 7.0 5.0 - 8.0   Glucose, UA NEGATIVE NEGATIVE mg/dL   Hgb urine dipstick NEGATIVE NEGATIVE   Bilirubin Urine NEGATIVE NEGATIVE   Ketones, ur 5 (A) NEGATIVE mg/dL   Protein, ur NEGATIVE NEGATIVE mg/dL   Nitrite NEGATIVE NEGATIVE   Leukocytes,Ua NEGATIVE NEGATIVE  Wet prep, genital     Status: Abnormal   Collection Time: 03/31/19  4:45 PM  Result Value Ref Range   Yeast Wet Prep HPF POC NONE SEEN NONE SEEN   Trich, Wet Prep NONE SEEN NONE SEEN   Clue Cells Wet Prep HPF POC PRESENT (A) NONE SEEN   WBC, Wet Prep HPF POC FEW (A) NONE SEEN   Sperm NONE SEEN   POCT fern test     Status: None   Collection Time: 03/31/19  5:07 PM  Result Value Ref Range   POCT Fern Test  Negative = intact amniotic membranes     Imaging:  No results found.  MAU Course: Orders Placed This Encounter  Procedures  . Wet prep, genital  . Culture, beta strep (group b only)  . Urinalysis, Routine w reflex microscopic  . POCT fern test  . Discharge patient   No orders of the defined types were placed in this encounter.   MDM: Reactive NST  GBS culture, GC/CT & wet prep collected as patient has missed several appts & goal is for delivery  at 37 wks.   No pooling on exam & fern negative.   Cervix 1/50/ballotable with some irregular contractions on monitor. Cervix unchanged after 1 + hour of monitoring & pt requesting to be discharged home.   Assessment: 1. Braxton Hick's contraction   2. [redacted] weeks gestation of pregnancy   3. Encounter for suspected PROM, with rupture of membranes not found     Plan: Discharge home in stable condition.  Preterm Labor precautions and fetal kick counts GBS & GC/CT pending Pt encouraged to keep all follow up appointments  Follow-up Information    [facility] Follow up.   Specialty: Obstetrics and Gynecology Why: return for worsening symptoms Contact information: [address] [id number] [place] [phone]          Allergies as of 03/31/2019   No Known Allergies     Medication List    STOP taking these medications   ondansetron 8 MG disintegrating tablet Commonly known as: Zofran ODT     TAKE these medications   acetaminophen 325 MG tablet Commonly known as: TYLENOL Take 325-650 mg by mouth every 6 (six) hours as needed (for pain).   AMBULATORY NON FORMULARY MEDICATION 1 Device by Other route once a week. Medication Name: Blood Pressure Cuff-Small Monitored regularly at home ICD-10: Z34.90 for LROB   ferrous fumarate 325 (106 Fe) MG Tabs tablet Commonly known as: HEMOCYTE - 106 mg FE Take 1 tablet (106 mg of iron total) by mouth daily.    nitrofurantoin (macrocrystal-monohydrate) 100 MG capsule Commonly known as: MACROBID Take 1 capsule (100 mg total) by mouth at bedtime.   prenatal multivitamin Tabs tablet Take 1 tablet by mouth daily at 12 noon.       Jorje Guild, NP 03/31/2019 6:14 PM

## 2019-04-02 LAB — CULTURE, BETA STREP (GROUP B ONLY)

## 2019-04-02 LAB — OB RESULTS CONSOLE GBS: GBS: NEGATIVE

## 2019-04-03 LAB — GC/CHLAMYDIA PROBE AMP (~~LOC~~) NOT AT ARMC
Chlamydia: NEGATIVE
Neisseria Gonorrhea: NEGATIVE

## 2019-04-04 ENCOUNTER — Encounter (HOSPITAL_COMMUNITY): Payer: Self-pay | Admitting: *Deleted

## 2019-04-04 ENCOUNTER — Telehealth (HOSPITAL_COMMUNITY): Payer: Self-pay | Admitting: *Deleted

## 2019-04-04 NOTE — Telephone Encounter (Signed)
Preadmission screen  

## 2019-04-05 ENCOUNTER — Encounter: Payer: Self-pay | Admitting: General Practice

## 2019-04-06 ENCOUNTER — Other Ambulatory Visit: Payer: Self-pay

## 2019-04-06 ENCOUNTER — Ambulatory Visit (HOSPITAL_COMMUNITY)
Admission: RE | Admit: 2019-04-06 | Discharge: 2019-04-06 | Disposition: A | Discharge status: Home / Self Care | Payer: Medicaid Other | Source: Ambulatory Visit | Attending: Obstetrics and Gynecology | Admitting: Obstetrics and Gynecology

## 2019-04-06 ENCOUNTER — Ambulatory Visit (HOSPITAL_COMMUNITY): Payer: Medicaid Other | Admitting: *Deleted

## 2019-04-06 ENCOUNTER — Encounter (HOSPITAL_COMMUNITY): Payer: Self-pay

## 2019-04-06 DIAGNOSIS — O365930 Maternal care for other known or suspected poor fetal growth, third trimester, not applicable or unspecified: Secondary | ICD-10-CM | POA: Insufficient documentation

## 2019-04-06 DIAGNOSIS — Z3A35 35 weeks gestation of pregnancy: Secondary | ICD-10-CM | POA: Diagnosis not present

## 2019-04-06 DIAGNOSIS — Z3480 Encounter for supervision of other normal pregnancy, unspecified trimester: Secondary | ICD-10-CM | POA: Insufficient documentation

## 2019-04-06 DIAGNOSIS — O361930 Maternal care for other isoimmunization, third trimester, not applicable or unspecified: Secondary | ICD-10-CM

## 2019-04-09 ENCOUNTER — Telehealth: Payer: Self-pay | Admitting: Obstetrics and Gynecology

## 2019-04-09 ENCOUNTER — Encounter: Payer: Medicaid Other | Admitting: Family Medicine

## 2019-04-09 ENCOUNTER — Encounter: Payer: Self-pay | Admitting: Family Medicine

## 2019-04-09 NOTE — Progress Notes (Signed)
Patient did not keep appointment today. She will be called to reschedule.  

## 2019-04-09 NOTE — Telephone Encounter (Signed)
Called the patient in regards to the missed appointment. Left a voicemail informing the patient please call our office at your earliest convienance. Also sending a missed appointment letter.

## 2019-04-10 ENCOUNTER — Encounter (HOSPITAL_COMMUNITY): Payer: Medicaid Other

## 2019-04-10 ENCOUNTER — Other Ambulatory Visit: Payer: Self-pay | Admitting: Advanced Practice Midwife

## 2019-04-11 ENCOUNTER — Inpatient Hospital Stay (HOSPITAL_BASED_OUTPATIENT_CLINIC_OR_DEPARTMENT_OTHER): Payer: Medicaid Other

## 2019-04-11 ENCOUNTER — Encounter (HOSPITAL_COMMUNITY): Payer: Self-pay

## 2019-04-11 ENCOUNTER — Other Ambulatory Visit: Payer: Self-pay

## 2019-04-11 ENCOUNTER — Inpatient Hospital Stay (HOSPITAL_COMMUNITY)
Admission: AD | Admit: 2019-04-11 | Discharge: 2019-04-11 | Disposition: A | Discharge status: Home / Self Care | Payer: Medicaid Other | Attending: Obstetrics & Gynecology | Admitting: Obstetrics & Gynecology

## 2019-04-11 DIAGNOSIS — N12 Tubulo-interstitial nephritis, not specified as acute or chronic: Secondary | ICD-10-CM | POA: Diagnosis not present

## 2019-04-11 DIAGNOSIS — O99013 Anemia complicating pregnancy, third trimester: Secondary | ICD-10-CM

## 2019-04-11 DIAGNOSIS — J9601 Acute respiratory failure with hypoxia: Secondary | ICD-10-CM | POA: Insufficient documentation

## 2019-04-11 DIAGNOSIS — O4703 False labor before 37 completed weeks of gestation, third trimester: Secondary | ICD-10-CM | POA: Insufficient documentation

## 2019-04-11 DIAGNOSIS — D72829 Elevated white blood cell count, unspecified: Secondary | ICD-10-CM | POA: Insufficient documentation

## 2019-04-11 DIAGNOSIS — Z3689 Encounter for other specified antenatal screening: Secondary | ICD-10-CM | POA: Insufficient documentation

## 2019-04-11 DIAGNOSIS — Z79899 Other long term (current) drug therapy: Secondary | ICD-10-CM | POA: Insufficient documentation

## 2019-04-11 DIAGNOSIS — E876 Hypokalemia: Secondary | ICD-10-CM | POA: Diagnosis not present

## 2019-04-11 DIAGNOSIS — O368190 Decreased fetal movements, unspecified trimester, not applicable or unspecified: Secondary | ICD-10-CM

## 2019-04-11 DIAGNOSIS — D649 Anemia, unspecified: Secondary | ICD-10-CM | POA: Insufficient documentation

## 2019-04-11 DIAGNOSIS — O368130 Decreased fetal movements, third trimester, not applicable or unspecified: Secondary | ICD-10-CM | POA: Insufficient documentation

## 2019-04-11 DIAGNOSIS — Z3A36 36 weeks gestation of pregnancy: Secondary | ICD-10-CM

## 2019-04-11 DIAGNOSIS — O365930 Maternal care for other known or suspected poor fetal growth, third trimester, not applicable or unspecified: Secondary | ICD-10-CM

## 2019-04-11 DIAGNOSIS — N179 Acute kidney failure, unspecified: Secondary | ICD-10-CM | POA: Insufficient documentation

## 2019-04-11 DIAGNOSIS — O2693 Pregnancy related conditions, unspecified, third trimester: Secondary | ICD-10-CM

## 2019-04-11 DIAGNOSIS — O26893 Other specified pregnancy related conditions, third trimester: Secondary | ICD-10-CM | POA: Diagnosis not present

## 2019-04-11 DIAGNOSIS — E872 Acidosis: Secondary | ICD-10-CM | POA: Insufficient documentation

## 2019-04-11 DIAGNOSIS — O99513 Diseases of the respiratory system complicating pregnancy, third trimester: Secondary | ICD-10-CM | POA: Diagnosis not present

## 2019-04-11 DIAGNOSIS — I7100 Dissection of unspecified site of aorta: Secondary | ICD-10-CM | POA: Diagnosis not present

## 2019-04-11 DIAGNOSIS — Z87891 Personal history of nicotine dependence: Secondary | ICD-10-CM | POA: Insufficient documentation

## 2019-04-11 LAB — POCT FERN TEST: POCT Fern Test: NEGATIVE

## 2019-04-11 NOTE — Discharge Instructions (Signed)

## 2019-04-11 NOTE — MAU Note (Signed)
Pt states that she has been having ctx since this morning around 2300.   Pt reports leaking clear fluid for a couple of days.   Pt reports decreased fetal movement since yesterday. Pt reports feeling the baby move one time today around 1500.    Pt e-mailed doctor's office with complaints and was told to come in as soon as possible.   Denies vaginal bleeding.

## 2019-04-11 NOTE — MAU Provider Note (Signed)
History   [id number]   Chief Complaint  Patient presents with  . pelvic pressure  . Contractions  . Decreased Fetal Movement  . Rupture of Membranes    HPI Deanna Sullivan is a 23 y.o. female  G3P2002 _0 .1 wks here with report of leaking clear fluid x2 days.  No gush of fluid but is changing her underwear often.  Pt reports 6 contractions today. She denies vaginal bleeding. Last intercourse was not recent. She reports decreased fetal movement since yesterday, only felt 2 movements since yeserday. She is eating and well. All other systems negative.    Patient's last menstrual period was 07/25/2018.  OB History  Gravida Para Term Preterm AB Living  _1 SAB TAB Ectopic Multiple Live Births        0 2    # Outcome Date GA Lbr Len/2nd Weight Sex Delivery Anes PTL Lv  3 Current           2 Term 01/02/18 51w0d02:20 / 00:15 3084 g M Vag-Spont None  LIV  1 Term 04/15/15 422w4d1:10 / 00:15 2870 g M Vag-Spont Local  LIV    Past Medical History:  Diagnosis Date  . [redacted] weeks gestation of pregnancy   . Acute pyelonephritis   . Acute renal failure syndrome ([facility]  . Acute respiratory failure with hypoxia (HCT[place]  . Anemia   . Aortic dissection ([facility]  . Back pain   . Chlamydia   . E coli bacteremia   . Encounter for fetal anatomic survey   . Fetal size inconsistent with dates   . Fever and chills   . Hx of postpartum hemorrhage, currently pregnant   . Hypokalemia   . Hypomagnesemia   . IUGR, antenatal   . Leucocytosis   . Metabolic acidosis   . Normocytic anemia   . NSVD (normal spontaneous vaginal delivery)   . Peripartum cardiomyopathy   . Poor fetal growth, affecting management of mother, third trimester, not applicable or unspecified fetus   . Postpartum endometritis   . Pyelonephritis   . Retained placenta with hemorrhage, postpartum condition   . Shock, septic ([place]  . Tachycardia, unspecified     Family History  Problem Relation Age of Onset  .  Cancer Mother   . Cancer Maternal Aunt     Social History   Socioeconomic History  . Marital status: Single    Spouse name: Not on file  . Number of children: Not on file  . Years of education: Not on file  . Highest education level: Not on file  Occupational History  . Not on file  Social Needs  . Financial resource strain: Not hard at all  . Food insecurity    Worry: Never true    Inability: Never true  . Transportation needs    Medical: No    Non-medical: No  Tobacco Use  . Smoking status: Former Smoker    Packs/day: 0.50    Types: Cigarettes    Quit date: 04/18/2017    Years since quitting: 1.9  . Smokeless tobacco: Never Used  Substance and Sexual Activity  . Alcohol use: No    Comment: occassionally  . Drug use: No  . Sexual activity: Not Currently    Birth control/protection: None  Lifestyle  . Physical activity    Days per week: Not on file    Minutes per session: Not on file  . Stress:  Not on file  Relationships  . Social Herbalist on phone: Not on file    Gets together: Not on file    Attends religious service: Not on file    Active member of club or organization: Not on file    Attends meetings of clubs or organizations: Not on file    Relationship status: Not on file  Other Topics Concern  . Not on file  Social History Narrative  . Not on file    No Known Allergies  No current facility-administered medications on file prior to encounter.    Current Outpatient Medications on File Prior to Encounter  Medication Sig Dispense Refill  . acetaminophen (TYLENOL) 325 MG tablet Take 325-650 mg by mouth every 6 (six) hours as needed (for pain).    . AMBULATORY NON FORMULARY MEDICATION 1 Device by Other route once a week. Medication Name: Blood Pressure Cuff-Small Monitored regularly at home ICD-10: Z34.90 for LROB 1 kit 0  . ferrous fumarate (HEMOCYTE - 106 MG FE) 325 (106 Fe) MG TABS tablet Take 1 tablet (106 mg of iron total) by mouth  daily. 30 each 4  . Prenatal Vit-Fe Fumarate-FA (PRENATAL MULTIVITAMIN) TABS tablet Take 1 tablet by mouth daily at 12 noon.    . nitrofurantoin, macrocrystal-monohydrate, (MACROBID) 100 MG capsule Take 1 capsule (100 mg total) by mouth at bedtime. (Patient not taking: Reported on 04/06/2019) 90 capsule 2     Review of Systems  Gastrointestinal: Negative for abdominal pain.  Genitourinary: Positive for vaginal discharge. Negative for vaginal bleeding.   Physical Exam   Vitals:   04/11/19 1740 04/11/19 1754  BP: 114/61   Pulse: 84   Resp: 16   Temp: 98.9 F (37.2 C)   SpO2: 95%   Weight:  59.1 kg    Physical Exam  Nursing note and vitals reviewed. Constitutional: She is oriented to person, place, and time. She appears well-developed and well-nourished. No distress.  HENT:  Head: Normocephalic and atraumatic.  Neck: Normal range of motion.  Cardiovascular: Normal rate.  Respiratory: Effort normal. No respiratory distress.  GI: Soft. She exhibits no distension. There is no abdominal tenderness.  gravid  Genitourinary:    Genitourinary Comments: SSE: no pool, fern neg SVE: closed/thick   Musculoskeletal: Normal range of motion.  Neurological: She is alert and oriented to person, place, and time.  Skin: Skin is warm and dry.  Psychiatric: She has a normal mood and affect.  EFM: 135 bpm, mod variability, + accels, no decels Toco: rare  No results found for this or any previous visit (from the past 24 hour(s)).  MAU Course  Procedures  MDM Her pregnancy is complicated by FGR and anemia. Labs and BPP ordered and reviewed. No evidence of PTL or SROM. Feeling good FM since [place]. NST reactive, BPP 8/8>10/10, AFI 17cm. Discussed strict FMCs. Stable for discharge home.   Assessment and Plan   1. [redacted] weeks gestation of pregnancy   2. Decreased fetal movement   3. NST (non-stress test) reactive    Discharge home Follow up in [place] clinic tomorrow as scheduled [facility] Labor  precautions  Allergies as of 04/11/2019   No Known Allergies     Medication List    STOP taking these medications   nitrofurantoin (macrocrystal-monohydrate) 100 MG capsule Commonly known as: MACROBID     TAKE these medications   acetaminophen 325 MG tablet Commonly known as: TYLENOL Take 325-650 mg by mouth every 6 (six) hours as  needed (for pain).   AMBULATORY NON FORMULARY MEDICATION 1 Device by Other route once a week. Medication Name: Blood Pressure Cuff-Small Monitored regularly at home ICD-10: Z34.90 for LROB   ferrous fumarate 325 (106 Fe) MG Tabs tablet Commonly known as: HEMOCYTE - 106 mg FE Take 1 tablet (106 mg of iron total) by mouth daily.   prenatal multivitamin Tabs tablet Take 1 tablet by mouth daily at 12 noon.       Julianne Handler, CNM 04/11/2019 7:35 PM

## 2019-04-12 ENCOUNTER — Ambulatory Visit: Payer: Medicaid Other

## 2019-04-13 ENCOUNTER — Ambulatory Visit (HOSPITAL_COMMUNITY)
Admission: RE | Admit: 2019-04-13 | Discharge: 2019-04-13 | Disposition: A | Discharge status: Home / Self Care | Payer: Medicaid Other | Source: Ambulatory Visit | Attending: Obstetrics and Gynecology | Admitting: Obstetrics and Gynecology

## 2019-04-13 ENCOUNTER — Ambulatory Visit (HOSPITAL_COMMUNITY): Payer: Medicaid Other

## 2019-04-13 ENCOUNTER — Telehealth: Payer: Self-pay | Admitting: Obstetrics and Gynecology

## 2019-04-13 NOTE — Telephone Encounter (Signed)
Attempted to call patient about her appointment on 8/31 @ 10:15. No answer left voicemail instructing patient to wear a face mask for the entire appointment and no visitors are allowed during the visit. Patient instructed not to attend the appointment if she was any symptoms. Symptom list and office number left.

## 2019-04-15 ENCOUNTER — Inpatient Hospital Stay (HOSPITAL_COMMUNITY)
Admission: RE | Admit: 2019-04-15 | Discharge: 2019-04-15 | Disposition: A | Discharge status: Home / Self Care | Payer: Medicaid Other | Source: Ambulatory Visit | Attending: *Deleted | Admitting: *Deleted

## 2019-04-16 ENCOUNTER — Other Ambulatory Visit (HOSPITAL_COMMUNITY): Payer: Medicaid Other

## 2019-04-16 ENCOUNTER — Encounter: Payer: Medicaid Other | Admitting: Obstetrics and Gynecology

## 2019-04-17 ENCOUNTER — Inpatient Hospital Stay (HOSPITAL_COMMUNITY): Payer: Medicaid Other

## 2019-04-17 ENCOUNTER — Other Ambulatory Visit (HOSPITAL_COMMUNITY): Payer: Self-pay | Admitting: *Deleted

## 2019-04-17 ENCOUNTER — Other Ambulatory Visit: Payer: Self-pay

## 2019-04-17 ENCOUNTER — Encounter (HOSPITAL_COMMUNITY): Payer: Self-pay

## 2019-04-17 ENCOUNTER — Inpatient Hospital Stay (HOSPITAL_COMMUNITY)
Admission: AD | Admit: 2019-04-17 | Discharge: 2019-04-19 | DRG: 807 | Disposition: A | Discharge status: Home / Self Care | Payer: Medicaid Other | Attending: Family Medicine | Admitting: Family Medicine

## 2019-04-17 DIAGNOSIS — Z20828 Contact with and (suspected) exposure to other viral communicable diseases: Secondary | ICD-10-CM | POA: Diagnosis present

## 2019-04-17 DIAGNOSIS — Z3490 Encounter for supervision of normal pregnancy, unspecified, unspecified trimester: Secondary | ICD-10-CM

## 2019-04-17 DIAGNOSIS — O09299 Supervision of pregnancy with other poor reproductive or obstetric history, unspecified trimester: Secondary | ICD-10-CM

## 2019-04-17 DIAGNOSIS — Z3A37 37 weeks gestation of pregnancy: Secondary | ICD-10-CM

## 2019-04-17 DIAGNOSIS — O365930 Maternal care for other known or suspected poor fetal growth, third trimester, not applicable or unspecified: Secondary | ICD-10-CM | POA: Diagnosis not present

## 2019-04-17 DIAGNOSIS — O09899 Supervision of other high risk pregnancies, unspecified trimester: Secondary | ICD-10-CM

## 2019-04-17 DIAGNOSIS — O365990 Maternal care for other known or suspected poor fetal growth, unspecified trimester, not applicable or unspecified: Secondary | ICD-10-CM | POA: Diagnosis present

## 2019-04-17 DIAGNOSIS — O361930 Maternal care for other isoimmunization, third trimester, not applicable or unspecified: Secondary | ICD-10-CM | POA: Diagnosis present

## 2019-04-17 DIAGNOSIS — D649 Anemia, unspecified: Secondary | ICD-10-CM | POA: Diagnosis present

## 2019-04-17 DIAGNOSIS — Z30430 Encounter for insertion of intrauterine contraceptive device: Secondary | ICD-10-CM

## 2019-04-17 DIAGNOSIS — Z87891 Personal history of nicotine dependence: Secondary | ICD-10-CM

## 2019-04-17 DIAGNOSIS — Z975 Presence of (intrauterine) contraceptive device: Secondary | ICD-10-CM

## 2019-04-17 DIAGNOSIS — O9902 Anemia complicating childbirth: Secondary | ICD-10-CM | POA: Diagnosis present

## 2019-04-17 LAB — CBC
HCT: 29.5 % — ABNORMAL LOW (ref 36.0–46.0)
Hemoglobin: 9.4 g/dL — ABNORMAL LOW (ref 12.0–15.0)
MCH: 26.9 pg (ref 26.0–34.0)
MCHC: 31.9 g/dL (ref 30.0–36.0)
MCV: 84.5 fL (ref 80.0–100.0)
Platelets: 210 10*3/uL (ref 150–400)
RBC: 3.49 MIL/uL — ABNORMAL LOW (ref 3.87–5.11)
RDW: 14.7 % (ref 11.5–15.5)
WBC: 8.4 10*3/uL (ref 4.0–10.5)
nRBC: 0.2 % (ref 0.0–0.2)

## 2019-04-17 LAB — SARS CORONAVIRUS 2 (TAT 6-24 HRS): SARS Coronavirus 2: NEGATIVE

## 2019-04-17 LAB — RPR: RPR Ser Ql: NONREACTIVE

## 2019-04-17 MED ORDER — SOD CITRATE-CITRIC ACID 500-334 MG/5ML PO SOLN: 30.0000 mL | ORAL | Status: DC | PRN

## 2019-04-17 MED ORDER — OXYCODONE-ACETAMINOPHEN 5-325 MG PO TABS: 1.0000 | ORAL_TABLET | ORAL | Status: DC | PRN

## 2019-04-17 MED ORDER — COCONUT OIL OIL: 1.0000 "application " | TOPICAL_OIL | Status: DC | PRN

## 2019-04-17 MED ORDER — ZOLPIDEM TARTRATE 5 MG PO TABS: 5.0000 mg | ORAL_TABLET | Freq: Every evening | ORAL | Status: DC | PRN

## 2019-04-17 MED ORDER — LEVONORGESTREL 19.5 MCG/DAY IU IUD
INTRAUTERINE_SYSTEM | INTRAUTERINE | Status: AC
  Filled 2019-04-17: qty 1

## 2019-04-17 MED ORDER — DIBUCAINE (PERIANAL) 1 % EX OINT: 1.0000 "application " | TOPICAL_OINTMENT | CUTANEOUS | Status: DC | PRN

## 2019-04-17 MED ORDER — OXYTOCIN 40 UNITS IN NORMAL SALINE INFUSION - SIMPLE MED
1.0000 m[IU]/min | INTRAVENOUS | Status: DC
  Administered 2019-04-17: 2 m[IU]/min via INTRAVENOUS

## 2019-04-17 MED ORDER — OXYTOCIN 40 UNITS IN NORMAL SALINE INFUSION - SIMPLE MED
2.5000 [IU]/h | INTRAVENOUS | Status: DC
  Filled 2019-04-17: qty 1000

## 2019-04-17 MED ORDER — ONDANSETRON HCL 4 MG PO TABS: 4.0000 mg | ORAL_TABLET | ORAL | Status: DC | PRN

## 2019-04-17 MED ORDER — TETANUS-DIPHTH-ACELL PERTUSSIS 5-2.5-18.5 LF-MCG/0.5 IM SUSP: 0.5000 mL | Freq: Once | INTRAMUSCULAR | Status: DC

## 2019-04-17 MED ORDER — PRENATAL MULTIVITAMIN CH
1.0000 | ORAL_TABLET | Freq: Every day | ORAL | Status: DC
  Administered 2019-04-18 – 2019-04-19 (×2): 1 via ORAL
  Filled 2019-04-17 (×2): qty 1

## 2019-04-17 MED ORDER — OXYCODONE-ACETAMINOPHEN 5-325 MG PO TABS: 2.0000 | ORAL_TABLET | ORAL | Status: DC | PRN

## 2019-04-17 MED ORDER — ACETAMINOPHEN 325 MG PO TABS: 650.0000 mg | ORAL_TABLET | ORAL | Status: DC | PRN

## 2019-04-17 MED ORDER — LACTATED RINGERS IV SOLN
INTRAVENOUS | Status: DC
  Administered 2019-04-17: 08:00:00 via INTRAVENOUS

## 2019-04-17 MED ORDER — TERBUTALINE SULFATE 1 MG/ML IJ SOLN: 0.2500 mg | Freq: Once | INTRAMUSCULAR | Status: DC | PRN

## 2019-04-17 MED ORDER — ACETAMINOPHEN 325 MG PO TABS
650.0000 mg | ORAL_TABLET | ORAL | Status: DC | PRN
  Administered 2019-04-17 – 2019-04-19 (×6): 650 mg via ORAL
  Filled 2019-04-17 (×6): qty 2

## 2019-04-17 MED ORDER — MISOPROSTOL 25 MCG QUARTER TABLET
25.0000 ug | ORAL_TABLET | ORAL | Status: DC | PRN
  Filled 2019-04-17: qty 1

## 2019-04-17 MED ORDER — FENTANYL CITRATE (PF) 100 MCG/2ML IJ SOLN
100.0000 ug | INTRAMUSCULAR | Status: DC | PRN
  Administered 2019-04-17 (×2): 100 ug via INTRAVENOUS
  Filled 2019-04-17: qty 2

## 2019-04-17 MED ORDER — WITCH HAZEL-GLYCERIN EX PADS: 1.0000 "application " | MEDICATED_PAD | CUTANEOUS | Status: DC | PRN

## 2019-04-17 MED ORDER — LEVONORGESTREL 19.5 MCG/DAY IU IUD
INTRAUTERINE_SYSTEM | Freq: Once | INTRAUTERINE | Status: AC
  Administered 2019-04-17: 1 via INTRAUTERINE

## 2019-04-17 MED ORDER — SENNOSIDES-DOCUSATE SODIUM 8.6-50 MG PO TABS
2.0000 | ORAL_TABLET | ORAL | Status: DC
  Administered 2019-04-18: 2 via ORAL
  Filled 2019-04-17 (×2): qty 2

## 2019-04-17 MED ORDER — IBUPROFEN 600 MG PO TABS
600.0000 mg | ORAL_TABLET | Freq: Four times a day (QID) | ORAL | Status: DC
  Administered 2019-04-17 – 2019-04-19 (×8): 600 mg via ORAL
  Filled 2019-04-17 (×8): qty 1

## 2019-04-17 MED ORDER — FENTANYL CITRATE (PF) 100 MCG/2ML IJ SOLN
INTRAMUSCULAR | Status: AC
  Filled 2019-04-17: qty 2

## 2019-04-17 MED ORDER — BUTORPHANOL TARTRATE 1 MG/ML IJ SOLN
INTRAMUSCULAR | Status: AC
  Filled 2019-04-17: qty 2

## 2019-04-17 MED ORDER — DIPHENHYDRAMINE HCL 25 MG PO CAPS: 25.0000 mg | ORAL_CAPSULE | Freq: Four times a day (QID) | ORAL | Status: DC | PRN

## 2019-04-17 MED ORDER — BENZOCAINE-MENTHOL 20-0.5 % EX AERO
1.0000 "application " | INHALATION_SPRAY | CUTANEOUS | Status: DC | PRN
  Filled 2019-04-17: qty 56

## 2019-04-17 MED ORDER — ONDANSETRON HCL 4 MG/2ML IJ SOLN: 4.0000 mg | Freq: Four times a day (QID) | INTRAMUSCULAR | Status: DC | PRN

## 2019-04-17 MED ORDER — SIMETHICONE 80 MG PO CHEW: 80.0000 mg | CHEWABLE_TABLET | ORAL | Status: DC | PRN

## 2019-04-17 MED ORDER — LIDOCAINE HCL (PF) 1 % IJ SOLN: 30.0000 mL | INTRAMUSCULAR | Status: DC | PRN

## 2019-04-17 MED ORDER — TRANEXAMIC ACID-NACL 1000-0.7 MG/100ML-% IV SOLN
1000.0000 mg | Freq: Once | INTRAVENOUS | Status: AC
  Administered 2019-04-17: 1000 mg via INTRAVENOUS
  Filled 2019-04-17: qty 100

## 2019-04-17 MED ORDER — OXYTOCIN BOLUS FROM INFUSION
500.0000 mL | Freq: Once | INTRAVENOUS | Status: DC
  Administered 2019-04-17: 500 mL via INTRAVENOUS

## 2019-04-17 MED ORDER — LACTATED RINGERS IV SOLN: 500.0000 mL | INTRAVENOUS | Status: DC | PRN

## 2019-04-17 MED ORDER — FLEET ENEMA 7-19 GM/118ML RE ENEM: 1.0000 | ENEMA | RECTAL | Status: DC | PRN

## 2019-04-17 MED ORDER — ONDANSETRON HCL 4 MG/2ML IJ SOLN: 4.0000 mg | INTRAMUSCULAR | Status: DC | PRN

## 2019-04-17 MED ORDER — BUTORPHANOL TARTRATE 1 MG/ML IJ SOLN
2.0000 mg | Freq: Once | INTRAMUSCULAR | Status: AC
  Administered 2019-04-17: 2 mg via INTRAVENOUS

## 2019-04-17 NOTE — H&P (Signed)
Deanna Sullivan is a 23 y.o. female presenting for IOL secondary to IUGR (<3%). She has no complaints today, she is feeling baby move. She denies leaking, vaginal dishcarge, vaginal bleeding. She denies SOB, leg swelling, or difficulty laying flat.  OB History    Gravida  3   Para  2   Term  2   Preterm      AB      Living  2     SAB      TAB      Ectopic      Multiple  0   Live Births  2          Past Medical History:  Diagnosis Date  . [redacted] weeks gestation of pregnancy   . Acute pyelonephritis   . Acute renal failure syndrome (HCC)   . Acute respiratory failure with hypoxia (HCC)   . Anemia   . Aortic dissection (HCC)   . Back pain   . Chlamydia   . E coli bacteremia   . Encounter for fetal anatomic survey   . Fetal size inconsistent with dates   . Fever and chills   . Hx of postpartum hemorrhage, currently pregnant   . Hypokalemia   . Hypomagnesemia   . IUGR, antenatal   . Leucocytosis   . Metabolic acidosis   . Normocytic anemia   . NSVD (normal spontaneous vaginal delivery)   . Peripartum cardiomyopathy   . Poor fetal growth, affecting management of mother, third trimester, not applicable or unspecified fetus   . Postpartum endometritis   . Pyelonephritis   . Retained placenta with hemorrhage, postpartum condition   . Shock, septic (HCC)   . Tachycardia, unspecified    Past Surgical History:  Procedure Laterality Date  . DILATION AND EVACUATION N/A 04/22/2015   Procedure: DILATATION AND EVACUATION;  Surgeon: Adam Phenix, MD;  Location: [facility];  Service: Gynecology;  Laterality: N/A;   Family History: family history includes Cancer in her maternal aunt and mother. Social History:  reports that she quit smoking about 1 years ago. Her smoking use included cigarettes. She smoked 0.50 packs per day. She has never used smokeless tobacco. She reports that she does not drink alcohol or use drugs.     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   HENT: Negative.   Eyes: Negative.   Respiratory: Negative.   Cardiovascular: Negative.   Gastrointestinal: Negative.   Genitourinary: Negative.   Musculoskeletal: Negative.   Skin: Negative.   Neurological: Negative.   Endo/Heme/Allergies: Negative.   Psychiatric/Behavioral: Negative.    Maternal Medical History:  Reason for admission: IOL 2/2 IUGR  Contractions: none  Fetal activity: Perceived fetal activity is normal.   Last perceived fetal movement was within the past hour.    Prenatal complications: IUGR.   No bleeding, infection, pre-eclampsia, preterm labor or substance abuse.   Prenatal Complications - Diabetes: none.    Dilation: 2 Effacement (%): 50 Station: -2 Exam by:: Bruce Mayers Blood pressure 103/61, pulse 68, resp. rate 18, last menstrual period 07/25/2018, not currently breastfeeding. Exam Physical Exam  Prenatal labs: ABO, Rh: --/--/O POS (09/01 2376) Antibody: POS (09/01 0755) Rubella: 3.82 (  05/19 1614) RPR: Non Reactive (05/19 1614)  HBsAg: Negative (05/19 1614)  HIV: Non Reactive (05/19 1614)  GBS:   Negative  FHR: 140 bpm, moderate variability, occasional variables. TOCO: no ctx  Assessment/Plan: 23 yo G3P2002 at 37.0 EGA, presenting for IOL 2/2 IUGR (<3%)  Labor:  Favorable cervix, Cook's catheter placed, start Pitocin. FWB: Fetal status reassuring Pain: IV pains meds ID: GBS neg  Risks and benefits of induction were reviewed, including failure of method, prolonged labor, need for further intervention, risk of cesarean.  Patient and family seem to  understand these risks and wish to proceed. Options of Cook's catheter, AROM, and pitocin reviewed, with use of each discussed.  Anticipate cervical ripening and vaginal delivery  Deanna Sullivan 04/17/2019, 9:00 AM

## 2019-04-17 NOTE — Progress Notes (Signed)
LABOR PROGRESS NOTE   Aliviyah Malanga is a 23 y.o. G3P2002 at [redacted]w[redacted]d  admitted for IOL 2/2 IUGR <3%. She has a PMHx significant for peripartum cardiomyopathy and PPH.   Subjective: Starting to feel contractions, feeling baby move. Denies SOB, LE swelling, orthopnea.   Objective: Blood pressure (!) 103/59, pulse 71, resp. rate 18, last menstrual period 07/25/2018, not currently breastfeeding.   Dilation: 2 Effacement (%): 50 Cervical Position: Posterior Station: -2 Presentation: Vertex Exam by: Dparacino FHT: baseline rate 135, moderate varibility, accels, no decels Toco: CTX q2-6 min   Assessment / Plan: Katherin Ramey is a 23 y.o. G3P2002 at [redacted]w[redacted]d  admitted for IOL 2/2 IUGR <3%.    Labor: Cook's catheter placed at [place]. Continue pitocin and time-appropriate cervical exams.   Fetal Wellbeing:  Fetal status reassuring. Cat I tracing. Cephalic by Leopold's and SVE.  -- continuous fetal monitoring    Pain Control:  Desires IV pain medications when contractions become painful   Anticipated MOD:  Anticipate cervical ripening with vaginal delivery, C/S as appropriate.   Mathis Dad, D.O. OB Fellow

## 2019-04-17 NOTE — Progress Notes (Addendum)
Labor Progress Note Deanna Sullivan is a 23 y.o. G3P2002 at [redacted]w[redacted]d presented for IOL secondary to IUGR (<3%).   S: She has no complaints at this time. She is feeling some of the contractions. No bleeding or discharge. Was consented for [name] placement postpartum.  O:  BP 109/65   Pulse 77   Temp 98.5 F (36.9 C) (Oral)   Resp 18   LMP 07/25/2018    NST: baseline 140, accelerations noted, moderate variability  CVE: Dilation: 4.5 Effacement (%): 70 Cervical Position: Posterior Station: -2 Presentation: Vertex Exam by:: Darral Dash, RN   A&P: 23 y.o. [id number] [redacted]w[redacted]d presenting for IOL 2/2 IUGR (<3%) #Labor: Progressing well. Will consider artificial ROM. Pit at 8. #Pain: Well controlled. Currently using fentanyl IV. #FWB: category 1 tracing, fetal status reassuring  #GBS negative   Phylliss Blakes, Medical Student 3:07 PM  GME ATTESTATION:  I saw and evaluated the patient with the medical student. I agree with the findings and the plan of care as documented in the medical student's note.  Merilyn Baba, DO OB Fellow

## 2019-04-17 NOTE — Discharge Summary (Addendum)
OB Discharge Summary     Patient Name: Deanna Sullivan DOB: 08/10/1996 MRN: [id number]  Date of admission: 04/17/2019 Delivering MD: Merilyn Baba   Date of discharge: 04/19/2019  Admitting diagnosis: pregnancy Intrauterine pregnancy: [redacted]w[redacted]d     Secondary diagnosis:  Active Problems:   Small for dates affecting management of mother   History of postpartum hemorrhage, currently pregnant   History of maternal cardiomyopathy, currently pregnant   Anemia   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   Encounter for induction of labor  Additional problems: None     Discharge diagnosis: Term Pregnancy Delivered                                                                                                Post partum procedures: Post-Placental IUD  Augmentation: AROM, Pitocin and Foley Balloon  Complications: None  Hospital course:  Induction of Labor With Vaginal Delivery   23 y.o. yo G3P2002 at [redacted]w[redacted]d was admitted to the hospital 04/17/2019 for induction of labor.  Indication for induction: IUGR (<3%).  Patient had an uncomplicated labor course as follows: Membrane Rupture Time/Date: 4:09 PM ,04/17/2019   Intrapartum Procedures: Episiotomy: None [1]                                         Lacerations:  None [1]  Patient had delivery of a Viable infant.  Information for the patient's newborn:  Roselynn, Whitacre [[id number]]  Delivery Method: Vaginal, Spontaneous(Filed from Delivery Summary)    04/17/2019  Details of delivery can be found in separate delivery note.  Patient had a routine postpartum course. Patient is discharged home 04/19/19.  Physical exam  Vitals:   04/17/19 2010 04/18/19 0058 04/18/19 0413 04/18/19 0810  BP: 118/80 (!) 108/59 120/84 106/68  Pulse: 81 69 71 77  Resp: 19  18 18   Temp: 97.7 F (36.5 C) 98.3 F (36.8 C) 97.6 F (36.4 C) 97.6 F (36.4 C)  TempSrc: Oral Oral Oral Oral  SpO2: 100% 100% 100% 100%   General:  alert, cooperative and no distress Lochia: appropriate Uterine Fundus: firm Incision: N/A DVT Evaluation: No evidence of DVT seen on physical exam. Labs: Lab Results  Component Value Date   WBC 10.7 (H) 04/18/2019   HGB 8.6 (L) 04/18/2019   HCT 27.4 (L) 04/18/2019   MCV 83.8 04/18/2019   PLT 197 04/18/2019   CMP Latest Ref Rng & Units 04/06/2018  Glucose 70 - 99 mg/dL 91  BUN 6 - 20 mg/dL 9  Creatinine 0.44 - 1.00 mg/dL 0.93  Sodium 135 - 145 mmol/L 140  Potassium 3.5 - 5.1 mmol/L 4.2  Chloride 98 - 111 mmol/L 106  CO2 22 - 32 mmol/L 28  Calcium 8.9 - 10.3 mg/dL 9.0  Total Protein 6.5 - 8.1 g/dL 7.2  Total Bilirubin 0.3 - 1.2 mg/dL 0.5  Alkaline Phos 38 - 126 U/L 75  AST 15 - 41 U/L 13(L)  ALT 0 - 44  U/L 8    Discharge instruction: per After Visit Summary and "Baby and Me Booklet".  After visit meds:  Allergies as of 04/19/2019   No Known Allergies     Medication List    TAKE these medications   acetaminophen 325 MG tablet Commonly known as: TYLENOL Take 325-650 mg by mouth every 6 (six) hours as needed (for pain). What changed: Another medication with the same name was added. Make sure you understand how and when to take each.   acetaminophen 325 MG tablet Commonly known as: Tylenol Take 2 tablets (650 mg total) by mouth every 4 (four) hours as needed. What changed: You were already taking a medication with the same name, and this prescription was added. Make sure you understand how and when to take each.   AMBULATORY NON FORMULARY MEDICATION 1 Device by Other route once a week. Medication Name: Blood Pressure Cuff-Small Monitored regularly at home ICD-10: Z34.90 for LROB   ferrous fumarate 325 (106 Fe) MG Tabs tablet Commonly known as: HEMOCYTE - 106 mg FE Take 1 tablet (106 mg of iron total) by mouth daily.   ibuprofen 800 MG tablet Commonly known as: ADVIL Take 1 tablet (800 mg total) by mouth every 8 (eight) hours as needed.   prenatal multivitamin  Tabs tablet Take 1 tablet by mouth daily at 12 noon.       Diet: routine diet  Activity: Advance as tolerated. Pelvic rest for 6 weeks.   Outpatient follow up:4 weeks Follow up Appt: Future Appointments  Date Time Provider Department Center  05/23/2019 10:55 AM Marny Lowenstein, PA-C [facility]   Postpartum contraception: IUD [place]  Newborn Data: Live born female  Birth Weight: 5 lb 4.3 oz (2390 g) APGAR: 9, 9  Newborn Delivery   Birth date/time: 04/17/2019 17:19:00 Delivery type: Vaginal, Spontaneous      Baby Feeding: Bottle Disposition:home with mother  Clayton Bibles, MSN, CNM Certified Nurse Midwife, Faculty [facility] 04/19/19 9:22 AM

## 2019-04-17 NOTE — Progress Notes (Signed)
Deanna Sullivan is a 23 y.o. G3P2002 at [redacted]w[redacted]d presented for IOL secondary to IUGR (<3%).   S:  She has no complaints at this time. She is feeling some of the contractions. No bleeding or discharge. She is feeling baby move.  O:  Vitals:   04/17/19 1614 04/17/19 1616  BP: 132/80 132/80  Pulse: 99 99  Resp:  19  Temp:  99.4 F (37.4 C)      FHR: baseline 145, accelerations noted, moderate variability, occasional variables  CVE: Dilation: 5 Effacement (%): 70 Cervical Position: Posterior Station: -2 Presentation: Vertex Exam by:: Deanna Loveland, DO  AROM with clear fluids  A&P: 23 y.o. [id number] [redacted]w[redacted]d presenting for IOL 2/2 IUGR (<3%) #Labor: AROM @1609 . Pit at 8. #Pain: Well controlled. Currently using fentanyl IV. #FWB: Fetal status reassuring  #GBS negative, no ABX  Anticipate vaginal delivery CS as appropriate

## 2019-04-18 LAB — CBC
HCT: 27.4 % — ABNORMAL LOW (ref 36.0–46.0)
Hemoglobin: 8.6 g/dL — ABNORMAL LOW (ref 12.0–15.0)
MCH: 26.3 pg (ref 26.0–34.0)
MCHC: 31.4 g/dL (ref 30.0–36.0)
MCV: 83.8 fL (ref 80.0–100.0)
Platelets: 197 10*3/uL (ref 150–400)
RBC: 3.27 MIL/uL — ABNORMAL LOW (ref 3.87–5.11)
RDW: 14.4 % (ref 11.5–15.5)
WBC: 10.7 10*3/uL — ABNORMAL HIGH (ref 4.0–10.5)
nRBC: 0 % (ref 0.0–0.2)

## 2019-04-18 MED ORDER — OXYCODONE HCL 5 MG PO TABS: 5.0000 mg | ORAL_TABLET | Freq: Four times a day (QID) | ORAL | Status: DC | PRN

## 2019-04-18 MED ORDER — OXYCODONE HCL 5 MG PO TABS
10.0000 mg | ORAL_TABLET | Freq: Once | ORAL | Status: AC
  Administered 2019-04-18: 02:00:00 10 mg via ORAL
  Filled 2019-04-18: qty 2

## 2019-04-18 MED ORDER — IBUPROFEN 600 MG PO TABS: 600.0000 mg | ORAL_TABLET | Freq: Four times a day (QID) | ORAL | 0 refills | Status: DC

## 2019-04-18 MED ORDER — FERROUS FUMARATE 325 (106 FE) MG PO TABS: 1.0000 | ORAL_TABLET | Freq: Every day | ORAL | 1 refills | Status: DC

## 2019-04-18 NOTE — Progress Notes (Signed)
Mom and baby safety precautions reviewed.

## 2019-04-18 NOTE — Progress Notes (Addendum)
POSTPARTUM PROGRESS NOTE  Post Partum Day 1  Subjective:  Deanna Sullivan is a 23 y.o. [id number] s/p SVD at [redacted]w[redacted]d.  She reports she is doing well. No acute events overnight. She denies any problems with ambulating, voiding or po intake. Denies nausea or vomiting.  Pain is well controlled.  Lochia is appropriate. She passed one golf ball sized clot yesterday. Denies any SOB, chest pain, dizziness or lightheadedness.  Objective: Blood pressure 106/68, pulse 77, temperature 97.6 F (36.4 C), temperature source Oral, resp. rate 18, last menstrual period 07/25/2018, SpO2 100 %, unknown if currently breastfeeding.  Physical Exam:  General: alert, cooperative and no distress Chest: no respiratory distress Heart:regular rate, distal pulses intact Abdomen: soft, nontender,  Uterine Fundus: firm, appropriately tender DVT Evaluation: No calf swelling or tenderness Extremities: no edema Skin: warm, dry  Recent Labs    04/17/19 0755 04/18/19 0428  HGB 9.4* 8.6*  HCT 29.5* 27.4*    Assessment/Plan: Deanna Sullivan is a 23 y.o. [id number] s/p SVD at [redacted]w[redacted]d for IUGR.    PPD#1 - Doing well  Routine postpartum care Anemia, asymptomatic: Begin ferrous sulfate on discharge Contraception: post placental IUD Feeding: bottle Dispo: Plan for discharge 04/19/2019   LOS: 1 day   Belenda Cruise, Medical Student 04/18/2019, 7:26 AM  I saw and evaluated the patient. I agree with the findings and the plan of care as documented in the medical student's note. Oxycodone order for rest on inpatient stay but patient understands this will not be prescribed on discharge. Discussed rooming-in vs discharging should baby not discharge today and mom would like to discharge. Will plan for DC later today after 24 hour mark. Vitals stable. Ferrous sulfate on discharge. No signs of edema, SOB. Bottle-feeding. IUD placed post-placentally.   Barrington Ellison, MD [facility] Fellow, [facility] for [employer], [place]

## 2019-04-18 NOTE — Clinical Social Work Maternal (Signed)
CLINICAL SOCIAL WORK MATERNAL/CHILD NOTE  Patient Details  Name: Deanna Sullivan MRN: [id number] Date of Birth: Sep 12, 1995  Date:  04/18/2019  Clinical Social Worker Initiating Note:  Elijio Miles Date/Time: Initiated:  04/18/19/1232     Child's Name:  Deanna Sullivan   Biological Parents:  Mother, Father(Lauri Viles and Mertie Moores DOB: 10/10/1995)   Need for Interpreter:  None   Reason for Referral:  Late or No Prenatal Care (Limited prenatal care)   Address:  [place] [address]    Phone number:  [phone] (home)     Additional phone number:   Household Members/Support Persons (HM/SP):   Household Member/Support Person 1, Household Member/Support Person 2, Household Member/Support Person 3   HM/SP Name Relationship DOB or Age  HM/SP -1 Ivery Quale 04/15/2015  HM/SP -2 Talmadge Coventry Son 01/02/2018  HM/SP -3   Mother    HM/SP -4        HM/SP -5        HM/SP -6        HM/SP -7        HM/SP -8          Natural Supports (not living in the home):  (MOB reported primary supports as FOB and her aunt)   Medical illustrator Supports: None   Employment: Unemployed   Type of Work:     Education:  Programmer, systems   Homebound arranged:    Museum/gallery curator Resources:  Medicaid   Other Resources:  Physicist, medical , WIC(Has already reached out to both)   Cultural/Religious Considerations Which May Impact Care:    Strengths:  Ability to meet basic needs , Home prepared for child , Pediatrician chosen   Psychotropic Medications:         Pediatrician:    [place]  Pediatrician List:   [place]  [place]      Pediatrician Fax Number:    Risk Factors/Current Problems:      Cognitive State:  Able to Concentrate , Alert , Linear Thinking    Mood/Affect:  Calm , Comfortable , Interested , Relaxed    CSW  Assessment:  CSW received consult for limited prenatal care.  CSW met with MOB to offer support and complete assessment.    MOB sitting up in bed with infant lying in front of her, when CSW entered the room. FOB also present at bedside but was in and out of sleep. CSW introduced self and received verbal permission from MOB to complete assessment with FOB present. MOB appropriate and attentive to both CSW and infant throughout assessment. MOB reported she currently lives with her mother and two other children. MOB stated she is active with both WIC and food stamps and has already reached out to both to get infant added on. 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 MOB described symptoms of feeling down a lot, not talking and not eating. MOB reported with her first child that symptoms lasted a couple of months and with her second child that symptoms lasted about a month. MOB denied being on medications or receiving counseling in the past. MOB reported she currently feels "ok" and stated she has good support from FOB and her aunt. CSW provided education regarding the baby blues period vs. perinatal mood disorders, discussed  treatment and gave resources for mental health follow up if concerns arise.  CSW recommends self-evaluation during the postpartum time period using the New Mom Checklist from Postpartum Progress and encouraged MOB to contact a medical professional if symptoms are noted at any time. MOB did not appear to be displaying any acute mental health symptoms and denied any SI or HI.   CSW inquired about reasoning for MOB's limited prenatal care and MOB stated primary reason was because of COVID19. MOB denied any barriers to getting infant to follow up appointments. CSW informed MOB of Hospital Drug Policy and explained infant's UDS came back negative but that CDS was still pending and that a CPS report would be made, if warranted. MOB  denied any substance use during pregnancy or history of CPS involvement. MOB denied any questions or concerns regarding policy.   MOB confirmed having all essential items for infant once discharged and stated infant would be sleeping in a bassinet once home. CSW provided review of Sudden Infant Death Syndrome (SIDS) precautions and safe sleeping habits.     CSW Plan/Description:  No Further Intervention Required/No Barriers to Discharge, Sudden Infant Death Syndrome (SIDS) Education, Perinatal Mood and Anxiety Disorder (PMADs) Education, Hospital Drug Screen Policy Information, CSW Will Continue to Monitor Umbilical Cord Tissue Drug Screen Results and Make Report if Warranted    Mackenzie Burcham, LCSWA 04/18/2019, 12:58 PM  

## 2019-04-19 DIAGNOSIS — Z975 Presence of (intrauterine) contraceptive device: Secondary | ICD-10-CM

## 2019-04-19 MED ORDER — ACETAMINOPHEN 325 MG PO TABS: 650.0000 mg | ORAL_TABLET | ORAL | 2 refills | Status: AC | PRN

## 2019-04-19 MED ORDER — IBUPROFEN 800 MG PO TABS: 800.0000 mg | ORAL_TABLET | Freq: Three times a day (TID) | ORAL | 0 refills | Status: DC | PRN

## 2019-04-19 NOTE — Discharge Instructions (Signed)

## 2019-04-20 ENCOUNTER — Ambulatory Visit (HOSPITAL_COMMUNITY): Payer: Medicaid Other

## 2019-04-20 LAB — BPAM RBC
Blood Product Expiration Date: 202009222359
Blood Product Expiration Date: 202009232359
Unit Type and Rh: 9500
Unit Type and Rh: 9500

## 2019-04-20 LAB — TYPE AND SCREEN
ABO/RH(D): O POS
Antibody Screen: POSITIVE
Donor AG Type: NEGATIVE
Donor AG Type: NEGATIVE
Unit division: 0
Unit division: 0

## 2019-04-25 ENCOUNTER — Encounter: Payer: Medicaid Other | Admitting: Obstetrics and Gynecology

## 2019-05-02 ENCOUNTER — Encounter: Payer: Medicaid Other | Admitting: Obstetrics and Gynecology

## 2019-05-07 ENCOUNTER — Encounter: Payer: Self-pay | Admitting: Family Medicine

## 2019-05-07 ENCOUNTER — Encounter: Payer: Self-pay | Admitting: Obstetrics and Gynecology

## 2019-05-22 ENCOUNTER — Telehealth: Payer: Self-pay | Admitting: Student

## 2019-05-22 NOTE — Telephone Encounter (Signed)
Called the patient to confirm the upcoming appointment. Left a voicemail informing the patient if she has been diagnosed with covid, in close contact with someone who has had covid, or experienced any flu-like symptoms such as fever, rash, sore throat, or shortness of breath please call our office to reschedule. Also advised patient no children or visitors are allowed due to the covid restrictions °

## 2019-05-23 ENCOUNTER — Ambulatory Visit: Payer: Medicaid Other | Admitting: Medical

## 2019-05-23 ENCOUNTER — Encounter: Payer: Self-pay | Admitting: Medical

## 2019-05-23 ENCOUNTER — Telehealth: Payer: Self-pay | Admitting: Medical

## 2019-05-23 NOTE — Telephone Encounter (Signed)
Attempted to call patient to get her rescheduled for her missed postpartum visit. No answer, left voicemail instructing patient to give the office a call back to be rescheduled. No show letter mailed.

## 2019-06-12 ENCOUNTER — Ambulatory Visit: Payer: Medicaid Other | Admitting: Women's Health

## 2019-12-07 ENCOUNTER — Ambulatory Visit: Payer: Medicaid Other

## 2020-01-09 ENCOUNTER — Other Ambulatory Visit: Payer: Self-pay

## 2020-01-09 ENCOUNTER — Ambulatory Visit (INDEPENDENT_AMBULATORY_CARE_PROVIDER_SITE_OTHER): Payer: Self-pay | Admitting: General Practice

## 2020-01-09 ENCOUNTER — Encounter: Payer: Self-pay | Admitting: General Practice

## 2020-01-09 DIAGNOSIS — Z331 Pregnant state, incidental: Secondary | ICD-10-CM

## 2020-01-09 DIAGNOSIS — Z3201 Encounter for pregnancy test, result positive: Secondary | ICD-10-CM

## 2020-01-09 DIAGNOSIS — Z3492 Encounter for supervision of normal pregnancy, unspecified, second trimester: Secondary | ICD-10-CM

## 2020-01-09 LAB — POCT PREGNANCY, URINE: Preg Test, Ur: POSITIVE — AB

## 2020-01-09 NOTE — Progress Notes (Signed)
Patient presents to office today for UPT. UPT +. Patient reports first positive home test 2 weeks ago. LMP around 09/24/19 EDD 06/30/2020 [redacted]w[redacted]d. She had a post placental IUD placed after delivery in September & hasn't seen an IUD come out. Patient reports having monthly cycles after delivery. Bedside ultrasound reveals active fetus, visually appears congruent with February LMP. No IUD visualized. Will schedule formal ultrasound to confirm dating and rule out if IUD is still in place. U/S Scheduled 6/8 @ 730am. Patient reports taking PNV daily. Patient will return to start OB care.   Chase Caller RN BSN 01/09/20

## 2020-01-10 NOTE — Progress Notes (Signed)
Agree with A & P. 

## 2020-01-18 ENCOUNTER — Encounter: Payer: Self-pay | Admitting: *Deleted

## 2020-01-22 ENCOUNTER — Ambulatory Visit: Payer: Medicaid Other | Attending: Obstetrics and Gynecology

## 2020-02-08 ENCOUNTER — Other Ambulatory Visit: Payer: Self-pay | Admitting: Obstetrics and Gynecology

## 2020-02-08 ENCOUNTER — Ambulatory Visit: Payer: Medicaid Other | Attending: Obstetrics and Gynecology

## 2020-02-08 ENCOUNTER — Other Ambulatory Visit: Payer: Self-pay | Admitting: *Deleted

## 2020-02-08 ENCOUNTER — Other Ambulatory Visit: Payer: Self-pay

## 2020-02-08 DIAGNOSIS — Z331 Pregnant state, incidental: Secondary | ICD-10-CM | POA: Insufficient documentation

## 2020-02-08 DIAGNOSIS — O09892 Supervision of other high risk pregnancies, second trimester: Secondary | ICD-10-CM

## 2020-02-08 DIAGNOSIS — Z3492 Encounter for supervision of normal pregnancy, unspecified, second trimester: Secondary | ICD-10-CM

## 2020-02-08 DIAGNOSIS — T8331XA Breakdown (mechanical) of intrauterine contraceptive device, initial encounter: Secondary | ICD-10-CM

## 2020-02-08 DIAGNOSIS — O365990 Maternal care for other known or suspected poor fetal growth, unspecified trimester, not applicable or unspecified: Secondary | ICD-10-CM

## 2020-02-08 DIAGNOSIS — O09292 Supervision of pregnancy with other poor reproductive or obstetric history, second trimester: Secondary | ICD-10-CM

## 2020-02-08 DIAGNOSIS — O0932 Supervision of pregnancy with insufficient antenatal care, second trimester: Secondary | ICD-10-CM | POA: Diagnosis not present

## 2020-02-08 DIAGNOSIS — Z3A19 19 weeks gestation of pregnancy: Secondary | ICD-10-CM

## 2020-02-08 DIAGNOSIS — Z363 Encounter for antenatal screening for malformations: Secondary | ICD-10-CM

## 2020-02-21 ENCOUNTER — Encounter: Payer: Medicaid Other | Admitting: Obstetrics and Gynecology

## 2020-02-23 NOTE — Progress Notes (Signed)
The patient did not keep her appointment today.  Duane Lope, NP 02/23/2020 8:37 AM

## 2020-03-07 ENCOUNTER — Other Ambulatory Visit: Payer: Self-pay | Admitting: *Deleted

## 2020-03-07 ENCOUNTER — Other Ambulatory Visit: Payer: Self-pay

## 2020-03-07 ENCOUNTER — Ambulatory Visit: Payer: Medicaid Other | Attending: Obstetrics and Gynecology

## 2020-03-07 ENCOUNTER — Ambulatory Visit: Payer: Medicaid Other

## 2020-03-07 DIAGNOSIS — Z3A23 23 weeks gestation of pregnancy: Secondary | ICD-10-CM

## 2020-03-07 DIAGNOSIS — O09299 Supervision of pregnancy with other poor reproductive or obstetric history, unspecified trimester: Secondary | ICD-10-CM

## 2020-03-07 DIAGNOSIS — O09292 Supervision of pregnancy with other poor reproductive or obstetric history, second trimester: Secondary | ICD-10-CM

## 2020-03-07 DIAGNOSIS — Z363 Encounter for antenatal screening for malformations: Secondary | ICD-10-CM

## 2020-03-07 DIAGNOSIS — O365990 Maternal care for other known or suspected poor fetal growth, unspecified trimester, not applicable or unspecified: Secondary | ICD-10-CM | POA: Insufficient documentation

## 2020-03-07 DIAGNOSIS — O09892 Supervision of other high risk pregnancies, second trimester: Secondary | ICD-10-CM

## 2020-03-07 DIAGNOSIS — O0932 Supervision of pregnancy with insufficient antenatal care, second trimester: Secondary | ICD-10-CM

## 2020-04-01 ENCOUNTER — Encounter: Payer: Medicaid Other | Admitting: Obstetrics and Gynecology

## 2020-04-01 ENCOUNTER — Telehealth: Payer: Self-pay | Admitting: Obstetrics and Gynecology

## 2020-04-01 NOTE — Telephone Encounter (Signed)
Attempted to contact patient to get her rescheduled for her missed new ob appointment. No answer, left voicemail for patient to give the office a call back.

## 2020-05-12 ENCOUNTER — Encounter: Payer: Medicaid Other | Admitting: Obstetrics & Gynecology

## 2020-05-16 ENCOUNTER — Ambulatory Visit: Payer: Medicaid Other | Attending: Obstetrics

## 2020-06-01 DIAGNOSIS — Z3A36 36 weeks gestation of pregnancy: Secondary | ICD-10-CM | POA: Diagnosis not present

## 2020-06-01 DIAGNOSIS — O4693 Antepartum hemorrhage, unspecified, third trimester: Secondary | ICD-10-CM | POA: Diagnosis not present

## 2020-06-01 DIAGNOSIS — Z3A38 38 weeks gestation of pregnancy: Secondary | ICD-10-CM | POA: Diagnosis not present

## 2020-06-01 DIAGNOSIS — O209 Hemorrhage in early pregnancy, unspecified: Secondary | ICD-10-CM | POA: Diagnosis not present

## 2020-06-01 DIAGNOSIS — O09293 Supervision of pregnancy with other poor reproductive or obstetric history, third trimester: Secondary | ICD-10-CM | POA: Diagnosis not present

## 2020-06-01 DIAGNOSIS — O4703 False labor before 37 completed weeks of gestation, third trimester: Secondary | ICD-10-CM | POA: Diagnosis not present

## 2020-06-01 DIAGNOSIS — R1030 Lower abdominal pain, unspecified: Secondary | ICD-10-CM | POA: Diagnosis not present

## 2020-06-01 DIAGNOSIS — Z560 Unemployment, unspecified: Secondary | ICD-10-CM | POA: Diagnosis not present

## 2020-06-01 DIAGNOSIS — Z3A35 35 weeks gestation of pregnancy: Secondary | ICD-10-CM | POA: Diagnosis not present

## 2020-06-01 DIAGNOSIS — O26893 Other specified pregnancy related conditions, third trimester: Secondary | ICD-10-CM | POA: Diagnosis not present

## 2020-06-01 DIAGNOSIS — O99013 Anemia complicating pregnancy, third trimester: Secondary | ICD-10-CM | POA: Diagnosis not present

## 2020-06-02 DIAGNOSIS — I340 Nonrheumatic mitral (valve) insufficiency: Secondary | ICD-10-CM | POA: Diagnosis not present

## 2020-06-02 DIAGNOSIS — O4703 False labor before 37 completed weeks of gestation, third trimester: Secondary | ICD-10-CM | POA: Diagnosis not present

## 2020-06-02 DIAGNOSIS — Z3A36 36 weeks gestation of pregnancy: Secondary | ICD-10-CM | POA: Diagnosis not present

## 2020-06-02 DIAGNOSIS — O99013 Anemia complicating pregnancy, third trimester: Secondary | ICD-10-CM | POA: Diagnosis not present

## 2020-06-02 DIAGNOSIS — I371 Nonrheumatic pulmonary valve insufficiency: Secondary | ICD-10-CM | POA: Diagnosis not present

## 2020-06-02 DIAGNOSIS — O09293 Supervision of pregnancy with other poor reproductive or obstetric history, third trimester: Secondary | ICD-10-CM | POA: Diagnosis not present

## 2020-06-02 DIAGNOSIS — I361 Nonrheumatic tricuspid (valve) insufficiency: Secondary | ICD-10-CM | POA: Diagnosis not present

## 2020-06-02 DIAGNOSIS — O903 Peripartum cardiomyopathy: Secondary | ICD-10-CM | POA: Diagnosis not present

## 2020-06-04 ENCOUNTER — Inpatient Hospital Stay (HOSPITAL_BASED_OUTPATIENT_CLINIC_OR_DEPARTMENT_OTHER): Payer: Medicaid Other

## 2020-06-04 ENCOUNTER — Other Ambulatory Visit: Payer: Self-pay

## 2020-06-04 ENCOUNTER — Encounter (HOSPITAL_COMMUNITY): Payer: Self-pay | Admitting: Obstetrics & Gynecology

## 2020-06-04 ENCOUNTER — Observation Stay (HOSPITAL_COMMUNITY)
Admission: AD | Admit: 2020-06-04 | Discharge: 2020-06-05 | Disposition: A | Discharge status: Home / Self Care | Payer: Medicaid Other | Attending: Obstetrics & Gynecology | Admitting: Obstetrics & Gynecology

## 2020-06-04 DIAGNOSIS — O0933 Supervision of pregnancy with insufficient antenatal care, third trimester: Secondary | ICD-10-CM

## 2020-06-04 DIAGNOSIS — Z20822 Contact with and (suspected) exposure to covid-19: Secondary | ICD-10-CM | POA: Diagnosis not present

## 2020-06-04 DIAGNOSIS — Z3A36 36 weeks gestation of pregnancy: Secondary | ICD-10-CM | POA: Insufficient documentation

## 2020-06-04 DIAGNOSIS — O365930 Maternal care for other known or suspected poor fetal growth, third trimester, not applicable or unspecified: Secondary | ICD-10-CM | POA: Insufficient documentation

## 2020-06-04 DIAGNOSIS — O361930 Maternal care for other isoimmunization, third trimester, not applicable or unspecified: Secondary | ICD-10-CM | POA: Diagnosis present

## 2020-06-04 DIAGNOSIS — O3433 Maternal care for cervical incompetence, third trimester: Secondary | ICD-10-CM | POA: Diagnosis present

## 2020-06-04 DIAGNOSIS — Z87891 Personal history of nicotine dependence: Secondary | ICD-10-CM | POA: Insufficient documentation

## 2020-06-04 DIAGNOSIS — O4693 Antepartum hemorrhage, unspecified, third trimester: Secondary | ICD-10-CM

## 2020-06-04 DIAGNOSIS — Z8759 Personal history of other complications of pregnancy, childbirth and the puerperium: Secondary | ICD-10-CM | POA: Insufficient documentation

## 2020-06-04 DIAGNOSIS — Z5329 Procedure and treatment not carried out because of patient's decision for other reasons: Secondary | ICD-10-CM

## 2020-06-04 DIAGNOSIS — D649 Anemia, unspecified: Secondary | ICD-10-CM | POA: Diagnosis present

## 2020-06-04 HISTORY — DX: Depression, unspecified: F32.A

## 2020-06-04 LAB — CBC
HCT: 26.2 % — ABNORMAL LOW (ref 36.0–46.0)
Hemoglobin: 8.3 g/dL — ABNORMAL LOW (ref 12.0–15.0)
MCH: 27 pg (ref 26.0–34.0)
MCHC: 31.7 g/dL (ref 30.0–36.0)
MCV: 85.3 fL (ref 80.0–100.0)
Platelets: 222 10*3/uL (ref 150–400)
RBC: 3.07 MIL/uL — ABNORMAL LOW (ref 3.87–5.11)
RDW: 14.6 % (ref 11.5–15.5)
WBC: 7.3 10*3/uL (ref 4.0–10.5)
nRBC: 0 % (ref 0.0–0.2)

## 2020-06-04 LAB — URINALYSIS, ROUTINE W REFLEX MICROSCOPIC
Bilirubin Urine: NEGATIVE
Glucose, UA: NEGATIVE mg/dL
Ketones, ur: NEGATIVE mg/dL
Nitrite: NEGATIVE
Protein, ur: NEGATIVE mg/dL
Specific Gravity, Urine: 1.009 (ref 1.005–1.030)
pH: 8 (ref 5.0–8.0)

## 2020-06-04 LAB — COMPREHENSIVE METABOLIC PANEL
ALT: 8 U/L (ref 0–44)
AST: 12 U/L — ABNORMAL LOW (ref 15–41)
Albumin: 2.5 g/dL — ABNORMAL LOW (ref 3.5–5.0)
Alkaline Phosphatase: 153 U/L — ABNORMAL HIGH (ref 38–126)
Anion gap: 8 (ref 5–15)
BUN: <5 mg/dL — ABNORMAL LOW (ref 6–20)
CO2: 25 mmol/L (ref 22–32)
Calcium: 8 mg/dL — ABNORMAL LOW (ref 8.9–10.3)
Chloride: 103 mmol/L (ref 98–111)
Creatinine, Ser: 0.47 mg/dL (ref 0.44–1.00)
GFR, Estimated: >60 mL/min (ref >60)
Glucose, Bld: 78 mg/dL (ref 70–99)
Potassium: 3.4 mmol/L — ABNORMAL LOW (ref 3.5–5.1)
Sodium: 136 mmol/L (ref 135–145)
Total Bilirubin: 0.2 mg/dL — ABNORMAL LOW (ref 0.3–1.2)
Total Protein: 6.1 g/dL — ABNORMAL LOW (ref 6.5–8.1)

## 2020-06-04 LAB — WET PREP, GENITAL
Clue Cells Wet Prep HPF POC: NONE SEEN
Sperm: NONE SEEN
Trich, Wet Prep: NONE SEEN

## 2020-06-04 LAB — RESPIRATORY PANEL BY RT PCR (FLU A&B, COVID)
Influenza A by PCR: NEGATIVE
Influenza B by PCR: NEGATIVE
SARS Coronavirus 2 by RT PCR: NEGATIVE

## 2020-06-04 LAB — RAPID URINE DRUG SCREEN, HOSP PERFORMED
Amphetamines: NOT DETECTED
Barbiturates: NOT DETECTED
Benzodiazepines: NOT DETECTED
Cocaine: NOT DETECTED
Opiates: NOT DETECTED
Tetrahydrocannabinol: NOT DETECTED

## 2020-06-04 LAB — RAPID HIV SCREEN (HIV 1/2 AB+AG)
HIV 1/2 Antibodies: NONREACTIVE
HIV-1 P24 Antigen - HIV24: NONREACTIVE

## 2020-06-04 LAB — PREPARE RBC (CROSSMATCH)

## 2020-06-04 LAB — HEPATITIS B SURFACE ANTIGEN: Hepatitis B Surface Ag: NONREACTIVE

## 2020-06-04 MED ORDER — DOCUSATE SODIUM 100 MG PO CAPS: 100.0000 mg | ORAL_CAPSULE | Freq: Every day | ORAL | Status: DC

## 2020-06-04 MED ORDER — LACTATED RINGERS IV SOLN: INTRAVENOUS | Status: DC

## 2020-06-04 MED ORDER — SODIUM CHLORIDE 0.9% IV SOLUTION: Freq: Once | INTRAVENOUS | Status: DC

## 2020-06-04 MED ORDER — ACETAMINOPHEN 325 MG PO TABS
650.0000 mg | ORAL_TABLET | ORAL | Status: DC | PRN
  Administered 2020-06-04: 650 mg via ORAL
  Filled 2020-06-04: qty 2

## 2020-06-04 MED ORDER — BETAMETHASONE SOD PHOS & ACET 6 (3-3) MG/ML IJ SUSP
12.0000 mg | Freq: Once | INTRAMUSCULAR | Status: AC
  Administered 2020-06-04: 12 mg via INTRAMUSCULAR
  Filled 2020-06-04: qty 5

## 2020-06-04 MED ORDER — PRENATAL MULTIVITAMIN CH
1.0000 | ORAL_TABLET | Freq: Every day | ORAL | Status: DC
  Administered 2020-06-05: 1 via ORAL
  Filled 2020-06-04: qty 1

## 2020-06-04 MED ORDER — ZOLPIDEM TARTRATE 5 MG PO TABS: 5.0000 mg | ORAL_TABLET | Freq: Every evening | ORAL | Status: DC | PRN

## 2020-06-04 MED ORDER — SODIUM CHLORIDE 0.9 % IV SOLN
2.0000 g | Freq: Four times a day (QID) | INTRAVENOUS | Status: DC
  Administered 2020-06-04 – 2020-06-05 (×3): 2 g via INTRAVENOUS
  Filled 2020-06-04 (×3): qty 2000

## 2020-06-04 MED ORDER — CALCIUM CARBONATE ANTACID 500 MG PO CHEW: 2.0000 | CHEWABLE_TABLET | ORAL | Status: DC | PRN

## 2020-06-04 NOTE — H&P (Signed)
Gavin Pound, CNM  Certified Nurse Midwife  Obstetrics/Gynecology  MAU Provider Note    Cosign Needed Addendum  Date of Service:  06/04/2020 2:14 PM             Show:Clear all _0 Manual_1 Template_2 Copied  Added by: _3 Gavin Pound, CNM  _4 Hover for details  History   CSN: [id number]  Arrival date and time: 06/04/20 1210   First Provider Initiated Contact with Patient 06/04/20 1414         Chief Complaint  Patient presents with  . Vaginal Bleeding  . Abdominal Pain  . Contractions   Deanna Sullivan is a 24 y.o. G4P3003 at 24w2dwho [facility]  She presents today for Vaginal Bleeding, Abdominal Pain, and Contractions. She states she had large bleed 3 days ago and was [facility]  Patient states she stayed overnight, but they could not explain why she was bleeding so she left.  Patient states she was still having bleeding, but "it was light."  Patient states the bleeding today is also light.  She denies sexual activity prior to the bleeding and states she has only seen bleeding with wiping since s/p her large bleeding incident. Patient reports cramping that started this afternoon.  She also reports she had cramping 3 days ago with onset of bleeding. She denies vaginal discharge or leaking prior to the bleeding.  She denies contractions currently, but state she had contractions this morning around 0630 and were 15 minutes apart.  However, patient states she hasn't had one since about 1130am.              OB History    Gravida  4   Para  3   Term  3   Preterm  0   AB  0   Living  3     SAB  0   TAB  0   Ectopic  0   Multiple  0   Live Births  3               Past Medical History:  Diagnosis Date  . [redacted] weeks gestation of pregnancy   . Acute pyelonephritis   . Acute [place] failure syndrome ([place]   . Acute respiratory failure with hypoxia ([place]   . Anemia   . Aortic dissection ([place]   . Back pain   .  Chlamydia   . Depression    current-pt reported  . E coli bacteremia   . Encounter for fetal anatomic survey   . Fetal size inconsistent with dates   . Fever and chills   . Hx of postpartum hemorrhage, currently pregnant   . Hypokalemia   . Hypomagnesemia   . IUGR, antenatal   . Leucocytosis   . Metabolic acidosis   . Normocytic anemia   . NSVD (normal spontaneous vaginal delivery)   . Peripartum cardiomyopathy   . Poor fetal growth, affecting management of mother, third trimester, not applicable or unspecified fetus   . Postpartum endometritis   . Pyelonephritis   . Retained placenta with hemorrhage, postpartum condition   . Shock, septic ([place]   . Tachycardia, unspecified          Past Surgical History:  Procedure Laterality Date  . [place] UTERUS     2016  . DILATION AND EVACUATION N/A 04/22/2015   Procedure: DILATATION AND EVACUATION;  Surgeon: JWoodroe Mode MD;  [place] [facility];  Service: Gynecology;  Laterality: N/A;  Family History  Problem Relation Age of Onset  . Cancer Mother   . Cancer Maternal Aunt     Social History        Tobacco Use  . Smoking status: Former Smoker    Packs/day: 0.50    Types: Cigarettes    Quit date: 04/18/2017    Years since quitting: 3.1  . Smokeless tobacco: Never Used  Vaping Use  . Vaping Use: Never used  Substance Use Topics  . Alcohol use: No    Comment: occassionally  . Drug use: No    Allergies: No Known Allergies         Medications Prior to Admission  Medication Sig Dispense Refill Last Dose  . ferrous fumarate (HEMOCYTE - 106 MG FE) 325 (106 Fe) MG TABS tablet Take 1 tablet (106 mg of iron total) by mouth daily. 90 tablet 1 06/03/2020 at Unknown time  . Prenatal Vit-Fe Fumarate-FA (PRENATAL MULTIVITAMIN) TABS tablet Take 1 tablet by mouth daily at 12 noon.   06/03/2020 at Unknown time  . acetaminophen (TYLENOL) 325 MG tablet Take  325-650 mg by mouth every 6 (six) hours as needed (for pain).     . AMBULATORY NON FORMULARY MEDICATION 1 Device by Other route once a week. Medication Name: Blood Pressure Cuff-Small Monitored regularly at home ICD-10: Z34.90 for LROB 1 kit 0   . ibuprofen (ADVIL) 800 MG tablet Take 1 tablet (800 mg total) by mouth every 8 (eight) hours as needed. 30 tablet 0     Review of Systems  Constitutional: Negative for chills and fever.  Respiratory: Negative for cough and shortness of breath.   Gastrointestinal: Positive for abdominal pain. Negative for constipation, diarrhea, nausea and vomiting.  Genitourinary: Positive for pelvic pain (Constant-Throbbing 7/10) and vaginal bleeding. Negative for difficulty urinating, dysuria and vaginal discharge.  Neurological: Negative for dizziness, light-headedness and headaches.   Physical Exam   Blood pressure 110/64, pulse 86, temperature 98.4 F (36.9 C), temperature source Oral, resp. rate 16, height _0  (1.6 m), weight 61.8 kg, last menstrual period 09/24/2019, SpO2 100 %, unknown if currently breastfeeding.  Physical Exam Vitals reviewed. Exam conducted with a chaperone present.  Constitutional:      Appearance: She is well-developed.  Cardiovascular:     Rate and Rhythm: Normal rate and regular rhythm.     Heart sounds: Normal heart sounds.  Pulmonary:     Effort: Pulmonary effort is normal. No respiratory distress.  Abdominal:     General: Bowel sounds are normal.     Palpations: Abdomen is soft.     Tenderness: There is no abdominal tenderness.     Comments: Gravid FH 36cm  Genitourinary:    Vagina: Vaginal discharge (Brownish red discharge c/w old blood. Removed with faux swab x 2.) present.     Comments: Sterile Speculum Exam: -Normal External Genitalia: Non tender, no apparent discharge at introitus.  -Vaginal Vault: Pink mucosa with good rugae. Small amt brownish red mucoid discharge. Removed with faux swab x 2. Small  white curd type discharge noted on vaginal walls -wet prep collected -Cervix:Pink, no lesions, cysts, or polyps.  Appears open. No active bleeding from os. Dark red spongy area on left side, grasped with ring forcep put remains in place.  -GC/CT collected -Bimanual Exam: Dilation: 6/50/Ballotable Exam by:: Gavin Pound CNM  Skin:    General: Skin is warm and dry.  Neurological:     Mental Status: She is alert and oriented to person, place, and  time.  Psychiatric:        Mood and Affect: Mood normal.        Behavior: Behavior normal.     Fetal Assessment 120 bpm, Mod Var, -Decels, +Accels Toco: Irritability Noted  MAU Course         Results for orders placed or performed during the hospital encounter of 06/04/20 (from the past 24 hour(s))  Urinalysis, Routine w reflex microscopic Urine, Clean Catch     Status: Abnormal   Collection Time: 06/04/20  1:42 PM  Result Value Ref Range   Color, Urine YELLOW YELLOW   APPearance HAZY (A) CLEAR   Specific Gravity, Urine 1.009 1.005 - 1.030   pH 8.0 5.0 - 8.0   Glucose, UA NEGATIVE NEGATIVE mg/dL   Hgb urine dipstick LARGE (A) NEGATIVE   Bilirubin Urine NEGATIVE NEGATIVE   Ketones, ur NEGATIVE NEGATIVE mg/dL   Protein, ur NEGATIVE NEGATIVE mg/dL   Nitrite NEGATIVE NEGATIVE   Leukocytes,Ua SMALL (A) NEGATIVE   RBC / HPF 11-20 0 - 5 RBC/hpf   WBC, UA 6-10 0 - 5 WBC/hpf   Bacteria, UA RARE (A) NONE SEEN   Squamous Epithelial / LPF 6-10 0 - 5   Mucus PRESENT   Wet prep, genital     Status: Abnormal   Collection Time: 06/04/20  2:53 PM   Specimen: PATH Cytology Cervicovaginal Ancillary Only  Result Value Ref Range   Yeast Wet Prep HPF POC PRESENT (A) NONE SEEN   Trich, Wet Prep NONE SEEN NONE SEEN   Clue Cells Wet Prep HPF POC NONE SEEN NONE SEEN   WBC, Wet Prep HPF POC MANY (A) NONE SEEN   Sperm NONE SEEN   Respiratory Panel by RT PCR (Flu A&B, Covid) - Nasopharyngeal Swab     Status: None    Collection Time: 06/04/20  2:58 PM   Specimen: Nasopharyngeal Swab  Result Value Ref Range   SARS Coronavirus 2 by RT PCR NEGATIVE NEGATIVE   Influenza A by PCR NEGATIVE NEGATIVE   Influenza B by PCR NEGATIVE NEGATIVE  Rapid urine drug screen (hospital performed)     Status: None   Collection Time: 06/04/20  3:12 PM  Result Value Ref Range   Opiates NONE DETECTED NONE DETECTED   Cocaine NONE DETECTED NONE DETECTED   Benzodiazepines NONE DETECTED NONE DETECTED   Amphetamines NONE DETECTED NONE DETECTED   Tetrahydrocannabinol NONE DETECTED NONE DETECTED   Barbiturates NONE DETECTED NONE DETECTED  CBC     Status: Abnormal   Collection Time: 06/04/20  3:19 PM  Result Value Ref Range   WBC 7.3 4.0 - 10.5 K/uL   RBC 3.07 (L) 3.87 - 5.11 MIL/uL   Hemoglobin 8.3 (L) 12.0 - 15.0 g/dL   HCT 26.2 (L) 36 - 46 %   MCV 85.3 80.0 - 100.0 fL   MCH 27.0 26.0 - 34.0 pg   MCHC 31.7 30.0 - 36.0 g/dL   RDW 14.6 11.5 - 15.5 %   Platelets 222 150 - 400 K/uL   nRBC 0.0 0.0 - 0.2 %  Comprehensive metabolic panel     Status: Abnormal   Collection Time: 06/04/20  3:19 PM  Result Value Ref Range   Sodium 136 135 - 145 mmol/L   Potassium 3.4 (L) 3.5 - 5.1 mmol/L   Chloride 103 98 - 111 mmol/L   CO2 25 22 - 32 mmol/L   Glucose, Bld 78 70 - 99 mg/dL   BUN <5 (L) 6 - 20  mg/dL   Creatinine, Ser 0.47 0.44 - 1.00 mg/dL   Calcium 8.0 (L) 8.9 - 10.3 mg/dL   Total Protein 6.1 (L) 6.5 - 8.1 g/dL   Albumin 2.5 (L) 3.5 - 5.0 g/dL   AST 12 (L) 15 - 41 U/L   ALT 8 0 - 44 U/L   Alkaline Phosphatase 153 (H) 38 - 126 U/L   Total Bilirubin 0.2 (L) 0.3 - 1.2 mg/dL   GFR, Estimated >60 >60 mL/min   Anion gap 8 5 - 15  Type and screen [place]     Status: None   Collection Time: 06/04/20  3:19 PM  Result Value Ref Range   ABO/RH(D) O POS    Antibody Screen POS    Sample Expiration 06/07/2020,2359    Antibody Identification      ANTI C ANTI  E Performed at [facility], [place] [address], [place], [place] [address]   Hepatitis B surface antigen     Status: None   Collection Time: 06/04/20  3:19 PM  Result Value Ref Range   Hepatitis B Surface Ag NON REACTIVE NON REACTIVE  Rapid HIV screen (HIV 1/2 Ab+Ag)     Status: None   Collection Time: 06/04/20  3:19 PM  Result Value Ref Range   HIV-1 P24 Antigen - HIV24 NON REACTIVE NON REACTIVE   HIV 1/2 Antibodies NON REACTIVE NON REACTIVE   Interpretation (HIV Ag Ab)      A non reactive test result means that HIV 1 or HIV 2 antibodies and HIV 1 p24 antigen were not detected in the specimen.  Prepare RBC (crossmatch)     Status: None   Collection Time: 06/04/20  4:26 PM  Result Value Ref Range   Order Confirmation      ORDER PROCESSED BY BLOOD BANK Performed at [facility], [address] [place], [place] [address]        MDM PE Labs: UA, Wet Prep, GC/CT, GBS, Standard Admit Labs  EFM Ultrasound MD Consult Assessment and Plan  24 year old G37P3003  SIUP at 36.2weeks Cat I FT Vaginal Bleeding  -POC Reviewed. -Exam performed and findings discussed. -Cultures collected and held. -Patient informed that area on cervix is concerning for previa, but not so concerning that provider would defer cervical exam. -Nurse instructed to collect admission labs and hold. -Dr. Harolyn Rutherford aware of patient status and updated on exam, findings, and POC. Advises: *Contact her regarding [place] results to formulate POC. *Collect Covid test for pending admission  Maryann Conners MSN, CNM 06/04/2020, 2:14 PM   Reassessment (3:24 PM) -Dr. Leslie Dales calls and requests clarification of patient order and needs regarding [place]. -Updated by other provider. -States that technician will be to unit after scanning an emergent patient. -Patient updated and without concerns.  -Admission labs placed by Dr. Imagene Riches -Dr. Jeani Sow. Anyanwu updated on POC.   Reassessment (4:17  PM) No Previa Likely Abruption PTL Candidiasis of Vagina  -[place] returns without signs of previa. -Patient labs as above. -Dr. Harolyn Rutherford updated on patient status, results, and advises: *Admit to [facility] for observation. -Patient informed of POC and agreeable.  Provider stresses importance of notifying staff if cramping worsens or with onset of contractions. -Patient verbalizes understanding and without questions or concerns. -Standard admit orders placed.  Maryann Conners MSN, CNM Advanced Practice Provider, [facility] [place]      Above note reviewed.  Woodroe Mode, MD 06/04/2020

## 2020-06-04 NOTE — MAU Note (Addendum)
Was in [place] over the weekend, woke up Saturday morning, was bleeding a lot.  Went to the hospital there, they kept her overnight, they couldn't tell her why she was bleeding.  Was 3 cm.  Is still bleeding, but not as heavy, no longer soaking pads.  Is having contractions(they were , now has been over an hour since the last) and bad pelvi pain.  (abd soft on palpation)

## 2020-06-04 NOTE — MAU Provider Note (Addendum)
History     CSN: [id number]  Arrival date and time: 06/04/20 1210   First Provider Initiated Contact with Patient 06/04/20 1414      Chief Complaint  Patient presents with  . Vaginal Bleeding  . Abdominal Pain  . Contractions   Deanna Sullivan is a 24 y.o. G4P3003 at 38w2dwho [facility]  She presents today for Vaginal Bleeding, Abdominal Pain, and Contractions. She states she had large bleed 3 days ago and was [facility]  Patient states she stayed overnight, but they could not explain why she was bleeding so she left.  Patient states she was still having bleeding, but "it was light."  Patient states the bleeding today is also light.  She denies sexual activity prior to the bleeding and states she has only seen bleeding with wiping since s/p her large bleeding incident. Patient reports cramping that started this afternoon.  She also reports she had cramping 3 days ago with onset of bleeding. She denies vaginal discharge or leaking prior to the bleeding.  She denies contractions currently, but state she had contractions this morning around 0630 and were 15 minutes apart.  However, patient states she hasn't had one since about 1130am.      OB History    Gravida  4   Para  3   Term  3   Preterm  0   AB  0   Living  3     SAB  0   TAB  0   Ectopic  0   Multiple  0   Live Births  3           Past Medical History:  Diagnosis Date  . [redacted] weeks gestation of pregnancy   . Acute pyelonephritis   . [place] syndrome ([place]   . Acute respiratory failure with hypoxia ([place]   . Anemia   . Aortic dissection ([place]   . Back pain   . Chlamydia   . Depression    current-pt reported  . E coli bacteremia   . Encounter for fetal anatomic survey   . Fetal size inconsistent with dates   . Fever and chills   . Hx of postpartum hemorrhage, currently pregnant   . Hypokalemia   . Hypomagnesemia   . IUGR, antenatal   . Leucocytosis   .  Metabolic acidosis   . Normocytic anemia   . NSVD (normal spontaneous vaginal delivery)   . Peripartum cardiomyopathy   . Poor fetal growth, affecting management of mother, third trimester, not applicable or unspecified fetus   . Postpartum endometritis   . Pyelonephritis   . Retained placenta with hemorrhage, postpartum condition   . Shock, septic ([facility]   . Tachycardia, unspecified     Past Surgical History:  Procedure Laterality Date  . [place] UTERUS     2016  . DILATION AND EVACUATION N/A 04/22/2015   Procedure: DILATATION AND EVACUATION;  Surgeon: JWoodroe Mode MD;  [place] [facility] [place];  Service: Gynecology;  Laterality: N/A;    Family History  Problem Relation Age of Onset  . Cancer Mother   . Cancer Maternal Aunt     Social History   Tobacco Use  . Smoking status: Former Smoker    Packs/day: 0.50    Types: Cigarettes    Quit date: 04/18/2017    Years since quitting: 3.1  . Smokeless tobacco: Never Used  Vaping Use  . Vaping Use: Never used  Substance Use Topics  . Alcohol use: No    Comment: occassionally  . Drug use: No    Allergies: No Known Allergies  Medications Prior to Admission  Medication Sig Dispense Refill Last Dose  . ferrous fumarate (HEMOCYTE - 106 MG FE) 325 (106 Fe) MG TABS tablet Take 1 tablet (106 mg of iron total) by mouth daily. 90 tablet 1 06/03/2020 at Unknown time  . Prenatal Vit-Fe Fumarate-FA (PRENATAL MULTIVITAMIN) TABS tablet Take 1 tablet by mouth daily at 12 noon.   06/03/2020 at Unknown time  . acetaminophen (TYLENOL) 325 MG tablet Take 325-650 mg by mouth every 6 (six) hours as needed (for pain).     . AMBULATORY NON FORMULARY MEDICATION 1 Device by Other route once a week. Medication Name: Blood Pressure Cuff-Small Monitored regularly at home ICD-10: Z34.90 for LROB 1 kit 0   . ibuprofen (ADVIL) 800 MG tablet Take 1 tablet (800 mg total) by mouth every 8 (eight) hours as needed. 30 tablet 0     Review of  Systems  Constitutional: Negative for chills and fever.  Respiratory: Negative for cough and shortness of breath.   Gastrointestinal: Positive for abdominal pain. Negative for constipation, diarrhea, nausea and vomiting.  Genitourinary: Positive for pelvic pain (Constant-Throbbing 7/10) and vaginal bleeding. Negative for difficulty urinating, dysuria and vaginal discharge.  Neurological: Negative for dizziness, light-headedness and headaches.   Physical Exam   Blood pressure 110/64, pulse 86, temperature 98.4 F (36.9 C), temperature source Oral, resp. rate 16, height _0  (1.6 m), weight 61.8 kg, last menstrual period 09/24/2019, SpO2 100 %, unknown if currently breastfeeding.  Physical Exam Vitals reviewed. Exam conducted with a chaperone present.  Constitutional:      Appearance: She is well-developed.  Cardiovascular:     Rate and Rhythm: Normal rate and regular rhythm.     Heart sounds: Normal heart sounds.  Pulmonary:     Effort: Pulmonary effort is normal. No respiratory distress.  Abdominal:     General: Bowel sounds are normal.     Palpations: Abdomen is soft.     Tenderness: There is no abdominal tenderness.     Comments: Gravid FH 36cm  Genitourinary:    Vagina: Vaginal discharge (Brownish red discharge c/w old blood. Removed with faux swab x 2.) present.     Comments: Sterile Speculum Exam: -Normal External Genitalia: Non tender, no apparent discharge at introitus.  -Vaginal Vault: Pink mucosa with good rugae. Small amt brownish red mucoid discharge. Removed with faux swab x 2. Small white curd type discharge noted on vaginal walls -wet prep collected -Cervix:Pink, no lesions, cysts, or polyps.  Appears open. No active bleeding from os. Dark red spongy area on left side, grasped with ring forcep put remains in place.  -GC/CT collected -Bimanual Exam: Dilation: 6/50/Ballotable Exam by:: Gavin Pound CNM  Skin:    General: Skin is warm and dry.  Neurological:      Mental Status: She is alert and oriented to person, place, and time.  Psychiatric:        Mood and Affect: Mood normal.        Behavior: Behavior normal.     Fetal Assessment 120 bpm, Mod Var, -Decels, +Accels Toco: Irritability Noted  MAU Course   Results for orders placed or performed during the hospital encounter of 06/04/20 (from the past 24 hour(s))  Urinalysis, Routine w reflex microscopic Urine, Clean Catch     Status: Abnormal   Collection Time: 06/04/20  1:42 PM  Result Value Ref Range   Color, Urine YELLOW YELLOW   APPearance HAZY (A) CLEAR   Specific Gravity, Urine 1.009 1.005 - 1.030   pH 8.0 5.0 - 8.0   Glucose, UA NEGATIVE NEGATIVE mg/dL   Hgb urine dipstick LARGE (A) NEGATIVE   Bilirubin Urine NEGATIVE NEGATIVE   Ketones, ur NEGATIVE NEGATIVE mg/dL   Protein, ur NEGATIVE NEGATIVE mg/dL   Nitrite NEGATIVE NEGATIVE   Leukocytes,Ua SMALL (A) NEGATIVE   RBC / HPF 11-20 0 - 5 RBC/hpf   WBC, UA 6-10 0 - 5 WBC/hpf   Bacteria, UA RARE (A) NONE SEEN   Squamous Epithelial / LPF 6-10 0 - 5   Mucus PRESENT   Wet prep, genital     Status: Abnormal   Collection Time: 06/04/20  2:53 PM   Specimen: PATH Cytology Cervicovaginal Ancillary Only  Result Value Ref Range   Yeast Wet Prep HPF POC PRESENT (A) NONE SEEN   Trich, Wet Prep NONE SEEN NONE SEEN   Clue Cells Wet Prep HPF POC NONE SEEN NONE SEEN   WBC, Wet Prep HPF POC MANY (A) NONE SEEN   Sperm NONE SEEN   Respiratory Panel by RT PCR (Flu A&B, Covid) - Nasopharyngeal Swab     Status: None   Collection Time: 06/04/20  2:58 PM   Specimen: Nasopharyngeal Swab  Result Value Ref Range   SARS Coronavirus 2 by RT PCR NEGATIVE NEGATIVE   Influenza A by PCR NEGATIVE NEGATIVE   Influenza B by PCR NEGATIVE NEGATIVE  Rapid urine drug screen (hospital performed)     Status: None   Collection Time: 06/04/20  3:12 PM  Result Value Ref Range   Opiates NONE DETECTED NONE DETECTED   Cocaine NONE DETECTED NONE DETECTED    Benzodiazepines NONE DETECTED NONE DETECTED   Amphetamines NONE DETECTED NONE DETECTED   Tetrahydrocannabinol NONE DETECTED NONE DETECTED   Barbiturates NONE DETECTED NONE DETECTED  CBC     Status: Abnormal   Collection Time: 06/04/20  3:19 PM  Result Value Ref Range   WBC 7.3 4.0 - 10.5 K/uL   RBC 3.07 (L) 3.87 - 5.11 MIL/uL   Hemoglobin 8.3 (L) 12.0 - 15.0 g/dL   HCT 26.2 (L) 36 - 46 %   MCV 85.3 80.0 - 100.0 fL   MCH 27.0 26.0 - 34.0 pg   MCHC 31.7 30.0 - 36.0 g/dL   RDW 14.6 11.5 - 15.5 %   Platelets 222 150 - 400 K/uL   nRBC 0.0 0.0 - 0.2 %  Comprehensive metabolic panel     Status: Abnormal   Collection Time: 06/04/20  3:19 PM  Result Value Ref Range   Sodium 136 135 - 145 mmol/L   Potassium 3.4 (L) 3.5 - 5.1 mmol/L   Chloride 103 98 - 111 mmol/L   CO2 25 22 - 32 mmol/L   Glucose, Bld 78 70 - 99 mg/dL   BUN <5 (L) 6 - 20 mg/dL   Creatinine, Ser 0.47 0.44 - 1.00 mg/dL   Calcium 8.0 (L) 8.9 - 10.3 mg/dL   Total Protein 6.1 (L) 6.5 - 8.1 g/dL   Albumin 2.5 (L) 3.5 - 5.0 g/dL   AST 12 (L) 15 - 41 U/L   ALT 8 0 - 44 U/L   Alkaline Phosphatase 153 (H) 38 - 126 U/L   Total Bilirubin 0.2 (L) 0.3 - 1.2 mg/dL   GFR, Estimated >60 >60 mL/min   Anion gap 8 5 - 15  Type  and screen [place]     Status: None   Collection Time: 06/04/20  3:19 PM  Result Value Ref Range   ABO/RH(D) O POS    Antibody Screen POS    Sample Expiration 06/07/2020,2359    Antibody Identification      ANTI C ANTI E Performed at [facility], [place] [address] [place], [place] [address]   Hepatitis B surface antigen     Status: None   Collection Time: 06/04/20  3:19 PM  Result Value Ref Range   Hepatitis B Surface Ag NON REACTIVE NON REACTIVE  Rapid HIV screen (HIV 1/2 Ab+Ag)     Status: None   Collection Time: 06/04/20  3:19 PM  Result Value Ref Range   HIV-1 P24 Antigen - HIV24 NON REACTIVE NON REACTIVE   HIV 1/2 Antibodies NON REACTIVE NON REACTIVE   Interpretation (HIV  Ag Ab)      A non reactive test result means that HIV 1 or HIV 2 antibodies and HIV 1 p24 antigen were not detected in the specimen.  Prepare RBC (crossmatch)     Status: None   Collection Time: 06/04/20  4:26 PM  Result Value Ref Range   Order Confirmation      ORDER PROCESSED BY BLOOD BANK Performed at [facility], [address] [place], [place] [address]        MDM PE Labs: UA, Wet Prep, GC/CT, GBS, Standard Admit Labs  EFM Ultrasound MD Consult Assessment and Plan  24 year old G59P3003  SIUP at 36.2weeks Cat I FT Vaginal Bleeding  -POC Reviewed. -Exam performed and findings discussed. -Cultures collected and held. -Patient informed that area on cervix is concerning for previa, but not so concerning that provider would defer cervical exam. -Nurse instructed to collect admission labs and hold. -Dr. Harolyn Rutherford aware of patient status and updated on exam, findings, and POC. Advises: *Contact her regarding [place] results to formulate POC. *Collect Covid test for pending admission  Maryann Conners MSN, CNM 06/04/2020, 2:14 PM   Reassessment (3:24 PM) -Dr. Leslie Dales calls and requests clarification of patient order and needs regarding [place]. -Updated by other provider. -States that technician will be to unit after scanning an emergent patient. -Patient updated and without concerns.  -Admission labs placed by Dr. Imagene Riches -Dr. Jeani Sow. Anyanwu updated on POC.   Reassessment (4:17 PM) No Previa Likely Abruption PTL Candidiasis of Vagina  -[place] returns without signs of previa. -Patient labs as above. -Dr. Harolyn Rutherford updated on patient status, results, and advises: *Admit to [facility] for observation. -Patient informed of POC and agreeable.  Provider stresses importance of notifying staff if cramping worsens or with onset of contractions. -Patient verbalizes understanding and without questions or concerns. -Standard admit orders placed.  Maryann Conners MSN, CNM Advanced Practice  Provider, Center for [employer] [facility]

## 2020-06-05 DIAGNOSIS — Z3A36 36 weeks gestation of pregnancy: Secondary | ICD-10-CM

## 2020-06-05 DIAGNOSIS — Z5329 Procedure and treatment not carried out because of patient's decision for other reasons: Secondary | ICD-10-CM

## 2020-06-05 LAB — RUBELLA SCREEN: Rubella: 3.19 {index} (ref >0.99)

## 2020-06-05 LAB — SYPHILIS: RPR W/REFLEX TO RPR TITER AND TREPONEMAL ANTIBODIES, TRADITIONAL SCREENING AND DIAGNOSIS ALGORITHM: RPR Ser Ql: NONREACTIVE

## 2020-06-05 LAB — GC/CHLAMYDIA PROBE AMP (~~LOC~~) NOT AT ARMC
Chlamydia: NEGATIVE
Comment: NEGATIVE
Comment: NORMAL
Neisseria Gonorrhea: NEGATIVE

## 2020-06-05 MED ORDER — FERROUS SULFATE 325 (65 FE) MG PO TABS
325.0000 mg | ORAL_TABLET | ORAL | Status: DC
  Administered 2020-06-05: 325 mg via ORAL
  Filled 2020-06-05: qty 1

## 2020-06-05 NOTE — Discharge Summary (Signed)
Antenatal Physician Discharge Summary - Left Against Medical Advice  Patient ID: Deanna Sullivan MRN: [id number] DOB/AGE: 22-Jul-1996 24 y.o.  Admit date: 06/04/2020 Discharge date: 06/05/2020  Admission Diagnoses: Preterm labor at 36weeks  Discharge Diagnoses: The same, left against medical advice  Prenatal Procedures: NST and ultrasound  Consults: None  Hospital Course:  Deanna Sullivan is a 24 y.o. [phone] with IUP at [redacted]w[redacted]d admitted for observation after she was noted to be 6 cm dilated. Was observed overnight, had infrequent contractions.  No leaking of fluid and no bleeding.  She received one dose of betamethasone.  The fetal heart rate monitoring remained reassuring, and she had no signs/symptoms of progressing preterm labor or other maternal-fetal concerns.  On 06/05/2020, patient left against medical advice as she reported she had to attend to an emergency with her son.  AMA papers signed with her RN, see RN notes.   No discharge exam done, she refused discharge NST.   Significant Diagnostic Studies:  Results for orders placed or performed during the hospital encounter of 06/04/20 (from the past 168 hour(s))  Urinalysis, Routine w reflex microscopic Urine, Clean Catch   Collection Time: 06/04/20  1:42 PM  Result Value Ref Range   Color, Urine YELLOW YELLOW   APPearance HAZY (A) CLEAR   Specific Gravity, Urine 1.009 1.005 - 1.030   pH 8.0 5.0 - 8.0   Glucose, UA NEGATIVE NEGATIVE mg/dL   Hgb urine dipstick LARGE (A) NEGATIVE   Bilirubin Urine NEGATIVE NEGATIVE   Ketones, ur NEGATIVE NEGATIVE mg/dL   Protein, ur NEGATIVE NEGATIVE mg/dL   Nitrite NEGATIVE NEGATIVE   Leukocytes,Ua SMALL (A) NEGATIVE   RBC / HPF 11-20 0 - 5 RBC/hpf   WBC, UA 6-10 0 - 5 WBC/hpf   Bacteria, UA RARE (A) NONE SEEN   Squamous Epithelial / LPF 6-10 0 - 5   Mucus PRESENT   GC/Chlamydia probe amp ([place])not at [facility]   Collection Time: 06/04/20  2:52 PM  Result Value Ref Range   Neisseria  Gonorrhea Negative    Chlamydia Negative    Comment Normal Reference Ranger Chlamydia - Negative    Comment      Normal Reference Range Neisseria Gonorrhea - Negative  Wet prep, genital   Collection Time: 06/04/20  2:53 PM   Specimen: PATH Cytology Cervicovaginal Ancillary Only  Result Value Ref Range   Yeast Wet Prep HPF POC PRESENT (A) NONE SEEN   Trich, Wet Prep NONE SEEN NONE SEEN   Clue Cells Wet Prep HPF POC NONE SEEN NONE SEEN   WBC, Wet Prep HPF POC MANY (A) NONE SEEN   Sperm NONE SEEN   Respiratory Panel by RT PCR (Flu A&B, Covid) - Nasopharyngeal Swab   Collection Time: 06/04/20  2:58 PM   Specimen: Nasopharyngeal Swab  Result Value Ref Range   SARS Coronavirus 2 by RT PCR NEGATIVE NEGATIVE   Influenza A by PCR NEGATIVE NEGATIVE   Influenza B by PCR NEGATIVE NEGATIVE  Culture, beta strep (group b only)   Collection Time: 06/04/20  3:00 PM   Specimen: Vaginal/Rectal; Genital  Result Value Ref Range   Specimen Description VAGINAL/RECTAL    Special Requests NONE    Culture      CULTURE REINCUBATED FOR BETTER GROWTH Performed at [facility], [address] [place], [place] [address]    Report Status PENDING   Rapid urine drug screen (hospital performed)   Collection Time: 06/04/20  3:12 PM  Result Value Ref  Range   Opiates NONE DETECTED NONE DETECTED   Cocaine NONE DETECTED NONE DETECTED   Benzodiazepines NONE DETECTED NONE DETECTED   Amphetamines NONE DETECTED NONE DETECTED   Tetrahydrocannabinol NONE DETECTED NONE DETECTED   Barbiturates NONE DETECTED NONE DETECTED  CBC   Collection Time: 06/04/20  3:19 PM  Result Value Ref Range   WBC 7.3 4.0 - 10.5 K/uL   RBC 3.07 (L) 3.87 - 5.11 MIL/uL   Hemoglobin 8.3 (L) 12.0 - 15.0 g/dL   HCT 26.8 (L) 36 - 46 %   MCV 85.3 80.0 - 100.0 fL   MCH 27.0 26.0 - 34.0 pg   MCHC 31.7 30.0 - 36.0 g/dL   RDW 34.1 96.2 - 22.9 %   Platelets 222 150 - 400 K/uL   nRBC 0.0 0.0 - 0.2 %  Comprehensive metabolic panel    Collection Time: 06/04/20  3:19 PM  Result Value Ref Range   Sodium 136 135 - 145 mmol/L   Potassium 3.4 (L) 3.5 - 5.1 mmol/L   Chloride 103 98 - 111 mmol/L   CO2 25 22 - 32 mmol/L   Glucose, Bld 78 70 - 99 mg/dL   BUN <5 (L) 6 - 20 mg/dL   Creatinine, Ser 7.98 0.44 - 1.00 mg/dL   Calcium 8.0 (L) 8.9 - 10.3 mg/dL   Total Protein 6.1 (L) 6.5 - 8.1 g/dL   Albumin 2.5 (L) 3.5 - 5.0 g/dL   AST 12 (L) 15 - 41 U/L   ALT 8 0 - 44 U/L   Alkaline Phosphatase 153 (H) 38 - 126 U/L   Total Bilirubin 0.2 (L) 0.3 - 1.2 mg/dL   GFR, Estimated >92 >11 mL/min   Anion gap 8 5 - 15  Hepatitis B surface antigen   Collection Time: 06/04/20  3:19 PM  Result Value Ref Range   Hepatitis B Surface Ag NON REACTIVE NON REACTIVE  Rubella screen   Collection Time: 06/04/20  3:19 PM  Result Value Ref Range   Rubella 3.19 Immune >0.99 index  RPR   Collection Time: 06/04/20  3:19 PM  Result Value Ref Range   RPR Ser Ql NON REACTIVE NON REACTIVE  Rapid HIV screen (HIV 1/2 Ab+Ag)   Collection Time: 06/04/20  3:19 PM  Result Value Ref Range   HIV-1 P24 Antigen - HIV24 NON REACTIVE NON REACTIVE   HIV 1/2 Antibodies NON REACTIVE NON REACTIVE   Interpretation (HIV Ag Ab)      A non reactive test result means that HIV 1 or HIV 2 antibodies and HIV 1 p24 antigen were not detected in the specimen.  Type and screen [facility]   Collection Time: 06/04/20  3:19 PM  Result Value Ref Range   ABO/RH(D) O POS    Antibody Screen POS    Sample Expiration 06/07/2020,2359    Antibody Identification      ANTI C ANTI E Performed at [facility], [address], [place], [place] [address]    Unit Number [id number]    Blood Component Type RED CELLS,LR    Unit division 00    Status of Unit ALLOCATED    Donor AG Type NEGATIVE FOR C ANTIGEN NEGATIVE FOR E ANTIGEN    Transfusion Status OK TO TRANSFUSE    Crossmatch Result COMPATIBLE    Unit Number [id number]    Blood Component Type RED  CELLS,LR    Unit division 00    Status of Unit ALLOCATED  Donor AG Type NEGATIVE FOR C ANTIGEN NEGATIVE FOR E ANTIGEN    Transfusion Status OK TO TRANSFUSE    Crossmatch Result COMPATIBLE   BPAM RBC   Collection Time: 06/04/20  3:19 PM  Result Value Ref Range   Blood Product Unit Number [id number]    PRODUCT CODE [id number]    Unit Type and Rh 9500    Blood Product Expiration Date 956213086578    Blood Product Unit Number [id number]    PRODUCT CODE [id number]    Unit Type and Rh 9500    Blood Product Expiration Date 536644034742   Prepare RBC (crossmatch)   Collection Time: 06/04/20  4:26 PM  Result Value Ref Range   Order Confirmation      ORDER PROCESSED BY BLOOD BANK Performed at [facility], [address], [place], [place] [address]    [place] MFM OB FOLLOW UP  Result Date: 06/04/2020 ----------------------------------------------------------------------  OBSTETRICS REPORT                       (Signed Final 06/04/2020 06:55 pm) ---------------------------------------------------------------------- Patient Info  ID #:       [id number]                          D.O.B.:  1996/06/20 (24 yrs)  Name:       Deanna Sullivan                  Visit Date: 06/04/2020 04:04 pm ---------------------------------------------------------------------- Performed By  Attending:        Lin Landsman      Ref. Address:     [address]                    MD                                                             [address]                                                             [place], [place]                                                             [address]  Performed By:     Marcellina Millin          [facility]  Referred By:      Hermina Staggers  MD ---------------------------------------------------------------------- Orders  #  Description                           Code         Ordered By  1  [place] MFM OB FOLLOW UP                   [id number]    JESSICA EMLY ----------------------------------------------------------------------  #  Order #                     Accession #                Episode #  1  [id number]                   [id number]                 [id number] ---------------------------------------------------------------------- Indications  Vaginal bleeding in pregnancy, third trimester O46.93  [redacted] weeks gestation of pregnancy                Z3A.36  Insufficient Prenatal Care                     O09.30 ---------------------------------------------------------------------- Fetal Evaluation  Num Of Fetuses:         1  Fetal Heart Rate(bpm):  132  Cardiac Activity:       Observed  Presentation:           Cephalic  Placenta:               Posterior  P. Cord Insertion:      Visualized  Amniotic Fluid  AFI FV:      Within normal limits  AFI Sum(cm)     %Tile       Largest Pocket(cm)  17.52           66          6.01  RUQ(cm)       RLQ(cm)       LUQ(cm)        LLQ(cm)  4.71          3.25          3.55           6.01 ---------------------------------------------------------------------- Biometry  BPD:      88.9  mm     G. Age:  36w 0d         51  %    CI:        77.14   %    70 - 86                                                          FL/HC:      21.8   %    20.1 - 22.1  HC:      320.5  mm     G. Age:  36w 1d         18  %    HC/AC:      1.04        0.93 - 1.11  AC:      307.2  mm     G. Age:  34w 5d  17  %    FL/BPD:     78.7   %    71 - 87  FL:         70  mm     G. Age:  35w 6d         36  %    FL/AC:      22.8   %    20 - 24  HUM:      62.8  mm     G. Age:  36w 3d         67  %  Est. FW:    2641  gm    5 lb 13 oz      26  % ---------------------------------------------------------------------- OB History  Gravidity:    4         Term:   3        Prem:   0        SAB:   0  TOP:          0       Ectopic:  0        Living: 3  ---------------------------------------------------------------------- Gestational Age  LMP:           36w 2d        Date:  09/24/19                 EDD:   06/30/20  U/S Today:     35w 5d                                        EDD:   07/04/20  Best:          36w 2d     Det. By:  LMP  (09/24/19)          EDD:   06/30/20 ---------------------------------------------------------------------- Anatomy  Cranium:               Appears normal         LVOT:                   Appears normal  Cavum:                 Appears normal         Aortic Arch:            Appears normal  Ventricles:            Appears normal         Ductal Arch:            Appears normal  Choroid Plexus:        Appears normal         Diaphragm:              Appears normal  Cerebellum:            Appears normal         Stomach:                Appears normal, left  sided  Posterior Fossa:       Appears normal         Abdomen:                Appears normal  Nuchal Fold:           Previously seen        Abdominal Wall:         Previously seen  Face:                  Orbits and profile     Cord Vessels:           Appears normal (3                         previously seen                                vessel cord)  Lips:                  Appears normal         Kidneys:                Appear normal  Palate:                Previously seen        Bladder:                Appears normal  Thoracic:              Appears normal         Spine:                  Limited views NL,                                                                        prev seen  Heart:                 Previously seen        Upper Extremities:      Visualized  RVOT:                  Appears normal         Lower Extremities:      Visualized  Other:  Heels/feet and open hands/5th digits previously visualized. Nasal          bone visualized. ---------------------------------------------------------------------- Cervix Uterus  Adnexa  Cervix  Not visualized (advanced GA >24wks) ---------------------------------------------------------------------- Impression  Single intrauterine pregnancy here for a detailed anatomy  due to new onset vaginal bleeding and uncertain prenatal  care.  Normal anatomy with measurements consistent with LMP.  There is good fetal movement and amniotic fluid volume  Suboptimal views of the fetal anatomy were obtained  secondary to fetal position and advanced gestational age.  The placent appears > 2 cm from the cervix. There is no  evidence of placental previa or placental abruption. ---------------------------------------------------------------------- Recommendations  Management per inpatient providers. ----------------------------------------------------------------------               Lin Landsman, MD Electronically Signed Final Report  06/04/2020 06:55 pm ----------------------------------------------------------------------   No future appointments.  Discharge Condition: Stable  Discharge disposition: 07-Left Against Medical Advice/Left Without Being Seen/Elopement        Allergies as of 06/05/2020   No Known Allergies     Medication List    STOP taking these medications   ibuprofen 800 MG tablet Commonly known as: ADVIL     TAKE these medications   acetaminophen 325 MG tablet Commonly known as: TYLENOL Take 325-650 mg by mouth every 6 (six) hours as needed (for pain).   AMBULATORY NON FORMULARY MEDICATION 1 Device by Other route once a week. Medication Name: Blood Pressure Cuff-Small Monitored regularly at home ICD-10: Z34.90 for LROB   ferrous fumarate 325 (106 Fe) MG Tabs tablet Commonly known as: HEMOCYTE - 106 mg FE Take 1 tablet (106 mg of iron total) by mouth daily.   prenatal multivitamin Tabs tablet Take 1 tablet by mouth daily at 12 noon.        Total discharge time documetation: 10 minutes   Signed: Jaynie Collins M.D. 06/05/2020, 2:52 PM

## 2020-06-05 NOTE — Progress Notes (Signed)
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 Dr. Macon Large made aware that patient wishes to leave against medical advice. Pt signed paperwork and refused fetal monitoring prior to leaving, stating "baby is moving just fine." Pt educated on signs and symptoms of labor and to come back when she starts having any regular contractions, increased pain/pressure,  bleeding or leaking of fluid. Pt verbalized understanding. IV was discontinued.

## 2020-06-05 NOTE — Progress Notes (Signed)
Patient ID: Deanna Sullivan, female   DOB: 1996/02/13, 24 y.o.   MRN: [id number] FACULTY PRACTICE ANTEPARTUM(COMPREHENSIVE) NOTE  Deanna Sullivan is a 24 y.o. G4P3003 at [redacted]w[redacted]d who is admitted for Preterm labor.   Fetal presentation is cephalic. Length of Stay:  1  Days  Subjective: Less contractions Patient reports the fetal movement as active. Patient reports uterine contraction  activity as 2/hr. Patient reports  vaginal bleeding as scant staining. Patient describes fluid per vagina as None.  Vitals:  Blood pressure (!) 102/50, pulse 80, temperature 97.6 F (36.4 C), temperature source Oral, resp. rate 17, height 5\' 3"  (1.6 m), weight 61.8 kg, last menstrual period 09/24/2019, SpO2 100 %, unknown if currently breastfeeding. Physical Examination:  General appearance - alert, well appearing, and in no distress Heart - normal rate and regular rhythm Abdomen - soft, nontender, nondistended Fundal Height:  size equals dates Cervical Exam: Not evaluated. Extremities: extremities normal, atraumatic, no cyanosis or edema and Homans sign is negative, no sign of DVT  Membranes:intact  Fetal Monitoring:  145 baseline, no decels, occasional contractions  Labs:  Results for orders placed or performed during the hospital encounter of 06/04/20 (from the past 24 hour(s))  Urinalysis, Routine w reflex microscopic Urine, Clean Catch   Collection Time: 06/04/20  1:42 PM  Result Value Ref Range   Color, Urine YELLOW YELLOW   APPearance HAZY (A) CLEAR   Specific Gravity, Urine 1.009 1.005 - 1.030   pH 8.0 5.0 - 8.0   Glucose, UA NEGATIVE NEGATIVE mg/dL   Hgb urine dipstick LARGE (A) NEGATIVE   Bilirubin Urine NEGATIVE NEGATIVE   Ketones, ur NEGATIVE NEGATIVE mg/dL   Protein, ur NEGATIVE NEGATIVE mg/dL   Nitrite NEGATIVE NEGATIVE   Leukocytes,Ua SMALL (A) NEGATIVE   RBC / HPF 11-20 0 - 5 RBC/hpf   WBC, UA 6-10 0 - 5 WBC/hpf   Bacteria, UA RARE (A) NONE SEEN   Squamous Epithelial / LPF 6-10 0 -  5   Mucus PRESENT   Wet prep, genital   Collection Time: 06/04/20  2:53 PM   Specimen: PATH Cytology Cervicovaginal Ancillary Only  Result Value Ref Range   Yeast Wet Prep HPF POC PRESENT (A) NONE SEEN   Trich, Wet Prep NONE SEEN NONE SEEN   Clue Cells Wet Prep HPF POC NONE SEEN NONE SEEN   WBC, Wet Prep HPF POC MANY (A) NONE SEEN   Sperm NONE SEEN   Respiratory Panel by RT PCR (Flu A&B, Covid) - Nasopharyngeal Swab   Collection Time: 06/04/20  2:58 PM   Specimen: Nasopharyngeal Swab  Result Value Ref Range   SARS Coronavirus 2 by RT PCR NEGATIVE NEGATIVE   Influenza A by PCR NEGATIVE NEGATIVE   Influenza B by PCR NEGATIVE NEGATIVE  Culture, beta strep (group b only)   Collection Time: 06/04/20  3:00 PM   Specimen: Vaginal/Rectal; Genital  Result Value Ref Range   Specimen Description VAGINAL/RECTAL    Special Requests NONE    Culture      CULTURE REINCUBATED FOR BETTER GROWTH Performed at [facility], [address], [place], [place]    Report Status PENDING   Rapid urine drug screen (hospital performed)   Collection Time: 06/04/20  3:12 PM  Result Value Ref Range   Opiates NONE DETECTED NONE DETECTED   Cocaine NONE DETECTED NONE DETECTED   Benzodiazepines NONE DETECTED NONE DETECTED   Amphetamines NONE DETECTED NONE DETECTED   Tetrahydrocannabinol NONE DETECTED NONE DETECTED  Barbiturates NONE DETECTED NONE DETECTED  CBC   Collection Time: 06/04/20  3:19 PM  Result Value Ref Range   WBC 7.3 4.0 - 10.5 K/uL   RBC 3.07 (L) 3.87 - 5.11 MIL/uL   Hemoglobin 8.3 (L) 12.0 - 15.0 g/dL   HCT 64.3 (L) 36 - 46 %   MCV 85.3 80.0 - 100.0 fL   MCH 27.0 26.0 - 34.0 pg   MCHC 31.7 30.0 - 36.0 g/dL   RDW 32.9 51.8 - 84.1 %   Platelets 222 150 - 400 K/uL   nRBC 0.0 0.0 - 0.2 %  Comprehensive metabolic panel   Collection Time: 06/04/20  3:19 PM  Result Value Ref Range   Sodium 136 135 - 145 mmol/L   Potassium 3.4 (L) 3.5 - 5.1 mmol/L   Chloride 103 98 - 111  mmol/L   CO2 25 22 - 32 mmol/L   Glucose, Bld 78 70 - 99 mg/dL   BUN <5 (L) 6 - 20 mg/dL   Creatinine, Ser 6.60 0.44 - 1.00 mg/dL   Calcium 8.0 (L) 8.9 - 10.3 mg/dL   Total Protein 6.1 (L) 6.5 - 8.1 g/dL   Albumin 2.5 (L) 3.5 - 5.0 g/dL   AST 12 (L) 15 - 41 U/L   ALT 8 0 - 44 U/L   Alkaline Phosphatase 153 (H) 38 - 126 U/L   Total Bilirubin 0.2 (L) 0.3 - 1.2 mg/dL   GFR, Estimated >63 >01 mL/min   Anion gap 8 5 - 15  Hepatitis B surface antigen   Collection Time: 06/04/20  3:19 PM  Result Value Ref Range   Hepatitis B Surface Ag NON REACTIVE NON REACTIVE  Rapid HIV screen (HIV 1/2 Ab+Ag)   Collection Time: 06/04/20  3:19 PM  Result Value Ref Range   HIV-1 P24 Antigen - HIV24 NON REACTIVE NON REACTIVE   HIV 1/2 Antibodies NON REACTIVE NON REACTIVE   Interpretation (HIV Ag Ab)      A non reactive test result means that HIV 1 or HIV 2 antibodies and HIV 1 p24 antigen were not detected in the specimen.  Type and screen [facility]   Collection Time: 06/04/20  3:19 PM  Result Value Ref Range   ABO/RH(D) O POS    Antibody Screen POS    Sample Expiration 06/07/2020,2359    Antibody Identification      ANTI C ANTI E Performed at [facility], [address] [place], [place] [address]    Unit Number [id number]    Blood Component Type RED CELLS,LR    Unit division 00    Status of Unit ALLOCATED    Donor AG Type NEGATIVE FOR C ANTIGEN NEGATIVE FOR E ANTIGEN    Transfusion Status OK TO TRANSFUSE    Crossmatch Result COMPATIBLE    Unit Number [id number]    Blood Component Type RED CELLS,LR    Unit division 00    Status of Unit ALLOCATED    Donor AG Type NEGATIVE FOR C ANTIGEN NEGATIVE FOR E ANTIGEN    Transfusion Status OK TO TRANSFUSE    Crossmatch Result COMPATIBLE   BPAM RBC   Collection Time: 06/04/20  3:19 PM  Result Value Ref Range   Blood Product Unit Number [id number]    PRODUCT CODE [id number]    Unit Type and Rh 9500    Blood Product  Expiration Date 703500938182    Blood Product Unit Number [id number]    PRODUCT CODE [id number]  Unit Type and Rh 9500    Blood Product Expiration Date [id number]   Prepare RBC (crossmatch)   Collection Time: 06/04/20  4:26 PM  Result Value Ref Range   Order Confirmation      ORDER PROCESSED BY BLOOD BANK Performed at [facility], [address], [place], [place] [address]      Medications:  Scheduled . sodium chloride   Intravenous Once  . docusate sodium  100 mg Oral Daily  . prenatal multivitamin  1 tablet Oral Q1200   I have reviewed the patient's current medications.  ASSESSMENT: Patient Active Problem List   Diagnosis Date Noted  . Premature cervical dilation in third trimester 06/04/2020  . 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 Anemia 01/18/2019  . Supervision of other normal pregnancy, antepartum 01/02/2019  . History of postpartum hemorrhage, currently pregnant 08/02/2017    PLAN: Continue observation for PTL. May d/c antibiotic and monitor every shift  Scheryl Darter 06/05/2020,7:20 AM

## 2020-06-06 LAB — CULTURE, BETA STREP (GROUP B ONLY)

## 2020-06-07 LAB — TYPE AND SCREEN
ABO/RH(D): O POS
Antibody Screen: POSITIVE
Donor AG Type: NEGATIVE
Donor AG Type: NEGATIVE
Unit division: 0
Unit division: 0

## 2020-06-07 LAB — BPAM RBC
Blood Product Expiration Date: 202110272359
Blood Product Expiration Date: 202110272359
Unit Type and Rh: 9500
Unit Type and Rh: 9500

## 2020-06-15 DIAGNOSIS — Z560 Unemployment, unspecified: Secondary | ICD-10-CM | POA: Diagnosis not present

## 2020-06-15 DIAGNOSIS — O4202 Full-term premature rupture of membranes, onset of labor within 24 hours of rupture: Secondary | ICD-10-CM | POA: Diagnosis not present

## 2020-06-15 DIAGNOSIS — O99824 Streptococcus B carrier state complicating childbirth: Secondary | ICD-10-CM | POA: Diagnosis not present

## 2020-06-15 DIAGNOSIS — O9902 Anemia complicating childbirth: Secondary | ICD-10-CM | POA: Diagnosis not present

## 2020-06-15 DIAGNOSIS — Z3A37 37 weeks gestation of pregnancy: Secondary | ICD-10-CM | POA: Diagnosis not present

## 2020-06-15 DIAGNOSIS — D509 Iron deficiency anemia, unspecified: Secondary | ICD-10-CM | POA: Diagnosis not present

## 2020-09-15 ENCOUNTER — Encounter: Payer: Medicaid Other | Admitting: Family

## 2020-09-15 NOTE — Progress Notes (Signed)
Patient did not show for appointment.   

## 2021-01-13 ENCOUNTER — Encounter: Payer: Self-pay | Admitting: *Deleted

## 2021-02-23 ENCOUNTER — Emergency Department (HOSPITAL_COMMUNITY)
Admission: EM | Admit: 2021-02-23 | Discharge: 2021-02-24 | Discharge status: Against Medical Advice | Payer: Medicaid Other

## 2021-02-23 ENCOUNTER — Other Ambulatory Visit: Payer: Self-pay

## 2021-02-23 ENCOUNTER — Inpatient Hospital Stay (HOSPITAL_COMMUNITY)
Admission: AD | Admit: 2021-02-23 | Discharge: 2021-02-24 | Disposition: A | Discharge status: Home / Self Care | Payer: Medicaid Other | Attending: Obstetrics and Gynecology | Admitting: Obstetrics and Gynecology

## 2021-02-23 ENCOUNTER — Encounter (HOSPITAL_COMMUNITY): Payer: Self-pay | Admitting: Obstetrics and Gynecology

## 2021-02-23 DIAGNOSIS — O26892 Other specified pregnancy related conditions, second trimester: Secondary | ICD-10-CM | POA: Insufficient documentation

## 2021-02-23 DIAGNOSIS — R42 Dizziness and giddiness: Secondary | ICD-10-CM | POA: Insufficient documentation

## 2021-02-23 DIAGNOSIS — N939 Abnormal uterine and vaginal bleeding, unspecified: Secondary | ICD-10-CM | POA: Diagnosis not present

## 2021-02-23 DIAGNOSIS — Z3A14 14 weeks gestation of pregnancy: Secondary | ICD-10-CM | POA: Insufficient documentation

## 2021-02-23 DIAGNOSIS — O209 Hemorrhage in early pregnancy, unspecified: Secondary | ICD-10-CM | POA: Diagnosis not present

## 2021-02-23 DIAGNOSIS — N8500 Endometrial hyperplasia, unspecified: Secondary | ICD-10-CM | POA: Diagnosis not present

## 2021-02-23 DIAGNOSIS — O034 Incomplete spontaneous abortion without complication: Secondary | ICD-10-CM | POA: Diagnosis not present

## 2021-02-23 DIAGNOSIS — Z3A00 Weeks of gestation of pregnancy not specified: Secondary | ICD-10-CM | POA: Diagnosis not present

## 2021-02-23 DIAGNOSIS — Z87891 Personal history of nicotine dependence: Secondary | ICD-10-CM | POA: Insufficient documentation

## 2021-02-23 LAB — POCT PREGNANCY, URINE: Preg Test, Ur: POSITIVE — AB

## 2021-02-23 NOTE — MAU Provider Note (Signed)
Chief Complaint: Abdominal Pain and Vaginal Bleeding   Event Date/Time   First Provider Initiated Contact with Patient 02/23/21 2340    SUBJECTIVE HPI: Deanna Sullivan is a 25 y.o. [id number] at [redacted]w[redacted]d by LMP who presents to maternity admissions reporting concern of vaginal bleeding and suprapubic cramping. Pt did not think she was pregnant prior to presenting to MAU today. No prior home pregnancy tests. On arrival pt with saturated pad in addition to cramping that started at 2300. Pt reports onset of bleeding at approximately 1700 today after burying her mother. Pt reports saturating 4 pads at home and also endorses dizziness. She has not had anything to eat today. Uncertain LMP. No safety concerns at home. Presents with her partner today.   She denies vaginal itching/burning, urinary symptoms, h/a, n/v, or fever/chills.     Past Medical History:  Diagnosis Date   [redacted] weeks gestation of pregnancy    Acute pyelonephritis    Acute renal failure syndrome (HCC)    Acute respiratory failure with hypoxia (HCC)    Anemia    Aortic dissection (HCC)    Back pain    Chlamydia    Depression    current-pt reported   E coli bacteremia    Encounter for fetal anatomic survey    Fetal size inconsistent with dates    Fever and chills    Hx of postpartum hemorrhage, currently pregnant    Hypokalemia    Hypomagnesemia    IUGR, antenatal    Leucocytosis    Metabolic acidosis    Normocytic anemia    NSVD (normal spontaneous vaginal delivery)    Peripartum cardiomyopathy    Poor fetal growth, affecting management of mother, third trimester, not applicable or unspecified fetus    Postpartum endometritis    Pyelonephritis    Retained placenta with hemorrhage, postpartum condition    Shock, septic (HCC)    Tachycardia, unspecified    Past Surgical History:  Procedure Laterality Date   DILATION AND CURETTAGE OF UTERUS     2016   DILATION AND EVACUATION N/A 04/22/2015   Procedure: DILATATION AND  EVACUATION;  Surgeon: Adam Phenix, MD;  Location: [facility];  Service: Gynecology;  Laterality: N/A;   Social History   Socioeconomic History   Marital status: Single    Spouse name: Not on file   Number of children: Not on file   Years of education: Not on file   Highest education level: Not on file  Occupational History   Not on file  Tobacco Use   Smoking status: Former    Packs/day: 0.50    Pack years: 0.00    Types: Cigarettes    Quit date: 04/18/2017    Years since quitting: 3.8   Smokeless tobacco: Never  Vaping Use   Vaping Use: Never used  Substance and Sexual Activity   Alcohol use: No    Comment: occassionally   Drug use: No   Sexual activity: Not Currently    Birth control/protection: None  Other Topics Concern   Not on file  Social History Narrative   Not on file   Social Determinants of Health   Financial Resource Strain: Not on file  Food Insecurity: Not on file  Transportation Needs: Not on file  Physical Activity: Not on file  Stress: Not on file  Social Connections: Not on file  Intimate Partner Violence: Not on file   No current facility-administered medications on file prior to encounter.   Current Outpatient Medications on  File Prior to Encounter  Medication Sig Dispense Refill   acetaminophen (TYLENOL) 325 MG tablet Take 325-650 mg by mouth every 6 (six) hours as needed (for pain).     No Known Allergies  ROS:  Review of Systems  Constitutional:  Negative for chills, fatigue and fever.  HENT:  Negative for congestion and sore throat.   Eyes:  Negative for photophobia and visual disturbance.  Respiratory:  Negative for cough, chest tightness and shortness of breath.   Cardiovascular:  Negative for chest pain.  Gastrointestinal:  Positive for abdominal pain. Negative for constipation, diarrhea, nausea and vomiting.  Genitourinary:  Positive for vaginal bleeding. Negative for dysuria, vaginal discharge and vaginal pain.  Musculoskeletal:   Negative for back pain and myalgias.  Neurological:  Positive for dizziness. Negative for light-headedness and headaches.  Psychiatric/Behavioral:  Positive for dysphoric mood.    I have reviewed patient's Past Medical Hx, Surgical Hx, Family Hx, Social Hx, medications and allergies.   Physical Exam  Patient Vitals for the past 24 hrs:  BP Temp Temp src Pulse Resp Height Weight  02/24/21 0218 107/60 -- -- 71 -- -- --  02/23/21 2320 110/65 98.7 F (37.1 C) Oral 99 20 5\' 5"  (1.651 m) 59.4 kg   Constitutional: Well-developed, well-nourished female in no acute distress. Tearful when discussing her mother's recent passing. Cardiovascular: normal rate Respiratory: normal effort GI: Abd soft, moderate tenderness to palpation over suprapubic region MS: Extremities nontender, no edema, normal ROM Neurologic: Alert and oriented x 4.  PELVIC EXAM: Cervix pink and dilated to approximately 4cm with moderate dark blood pooling in vaginal vault. Products of conception protruding through external os but unable to dislodge. Vaginal walls and external genitalia normal.  LAB RESULTS Results for orders placed or performed during the hospital encounter of 02/23/21 (from the past 24 hour(s))  Pregnancy, urine POC     Status: Abnormal   Collection Time: 02/23/21 11:29 PM  Result Value Ref Range   Preg Test, Ur POSITIVE (A) NEGATIVE  CBC     Status: Abnormal   Collection Time: 02/23/21 11:55 PM  Result Value Ref Range   WBC 11.1 (H) 4.0 - 10.5 K/uL   RBC 3.89 3.87 - 5.11 MIL/uL   Hemoglobin 11.2 (L) 12.0 - 15.0 g/dL   HCT 04/26/21 (L) 40.8 - 14.4 %   MCV 85.3 80.0 - 100.0 fL   MCH 28.8 26.0 - 34.0 pg   MCHC 33.7 30.0 - 36.0 g/dL   RDW 81.8 56.3 - 14.9 %   Platelets 242 150 - 400 K/uL   nRBC 0.0 0.0 - 0.2 %  Comprehensive metabolic panel     Status: Abnormal   Collection Time: 02/23/21 11:55 PM  Result Value Ref Range   Sodium 135 135 - 145 mmol/L   Potassium 3.8 3.5 - 5.1 mmol/L   Chloride 103 98 -  111 mmol/L   CO2 24 22 - 32 mmol/L   Glucose, Bld 103 (H) 70 - 99 mg/dL   BUN 10 6 - 20 mg/dL   Creatinine, Ser 04/26/21 0.44 - 1.00 mg/dL   Calcium 8.8 (L) 8.9 - 10.3 mg/dL   Total Protein 7.0 6.5 - 8.1 g/dL   Albumin 3.6 3.5 - 5.0 g/dL   AST 16 15 - 41 U/L   ALT 11 0 - 44 U/L   Alkaline Phosphatase 64 38 - 126 U/L   Total Bilirubin 1.0 0.3 - 1.2 mg/dL   GFR, Estimated 6.37 >85 mL/min   Anion  gap 8 5 - 15  hCG, quantitative, pregnancy     Status: Abnormal   Collection Time: 02/23/21 11:55 PM  Result Value Ref Range   hCG, Beta Chain, Quant, S 3,549 (H) <5 mIU/mL    --/--/O POS (10/20 1519)  IMAGING US OB LESS THAN 14 WEEKS WITH OB TRANSVAGINAL  Result Date: 02/24/2021 CLINICAL DATA:  Heavy vaginal bleeding EXAM: OBSTETRIC <14 WK [place] AND TRANSVAGINAL OB US TECHNIQUE: Both transabdominal and transvaginal ultrasound examinations were performed for complete evaluation of the gestation as well as the maternal uterus, adnexal regions, and pelvic cul-de-sac. Transvaginal technique was performed to assess early pregnancy. COMPARISON:  Obstetrical ultrasound from prior gestation 06/04/2020 FINDINGS: Intrauterine gestational sac: None Yolk sac:  Not Visualized. Embryo:  Not Visualized. Cardiac Activity: Not Visualized. Maternal uterus/adnexae: Anteverted uterus. Heterogeneously thickened endometrium measuring up to 24 mm in maximal double stripe thickness. With some increased vascularity and prominent vascular channel posteriorly. No discrete gestational sac or endometrial fluid collection is identified at this time. Unremarkable appearance of the right and left ovaries. No discrete adnexal mass or worrisome lesion. No free fluid seen in the pelvis. IMPRESSION: Heterogeneous endometrial thickening may reflect decidual reaction albeit without visible intrauterine pregnancy. Differential considerations would include early intrauterine pregnancy too early to visualize, spontaneous abortion, or occult ectopic  pregnancy. Recommend close clinical followup and serial quantitative beta HCGs and ultrasounds. Electronically Signed   By: Kreg Shropshire M.D.   On: 02/24/2021 00:33    MAU Management/MDM: Orders Placed This Encounter  Procedures   US OB LESS THAN 14 WEEKS WITH OB TRANSVAGINAL   CBC   Comprehensive metabolic panel   hCG, quantitative, pregnancy   Pregnancy, urine POC   Discharge patient Discharge disposition: 01-Home or Self Care; Discharge patient date: 02/24/2021    Meds ordered this encounter  Medications   misoprostol (CYTOTEC) 200 MCG tablet    Sig: Place 4 tablets (800 mcg total) vaginally once for 1 dose. Place tablets in vagina for treatment of incomplete miscarriage.    Dispense:  4 tablet    Refill:  0   ibuprofen (ADVIL) 200 MG tablet    Sig: Take 3 tablets (600 mg total) by mouth every 8 (eight) hours as needed for moderate pain or cramping.    Dispense:  30 tablet    Refill:  0     Treatments in MAU included: none. Pt discharged with strict return precautions for worsening vaginal bleeding, severe abdominal pain, presyncope or other concerns.  Discussed pt and overall presentation with Dr. Donavan Foil prior to discharge. Plan for vaginal cytotec with close f/u in clinic on 7/14.  ASSESSMENT & PLAN: Gennieve Elpers is a 25 y.o. [id number] at [redacted]w[redacted]d by LMP who presents to maternity admissions reporting concern of vaginal bleeding and suprapubic cramping. Findings on formal ultrasound and sterile speculum exam consistent with incomplete miscarriage. Reassuringly, vital signs stable and only mild anemia with Hgb 11.2. Beta-hcg 3,549. 1. Incomplete miscarriage   2. Vaginal bleeding   - discussed diagnosis of incomplete miscarriage extensively with pt - prescribed vaginal cytotec 800 mcg once s/p conversation with Dr. Donavan Foil - plan for close f/u in clinic on 7/14 or sooner for strict return precautions as noted above  Discharge home Allergies as of 02/24/2021   No Known Allergies       Medication List     STOP taking these medications    AMBULATORY NON FORMULARY MEDICATION   ferrous fumarate 325 (106 Fe) MG Tabs tablet Commonly known  as: HEMOCYTE - 106 mg FE   prenatal multivitamin Tabs tablet       TAKE these medications    acetaminophen 325 MG tablet Commonly known as: TYLENOL Take 325-650 mg by mouth every 6 (six) hours as needed (for pain).   ibuprofen 200 MG tablet Commonly known as: Advil Take 3 tablets (600 mg total) by mouth every 8 (eight) hours as needed for moderate pain or cramping.   misoprostol 200 MCG tablet Commonly known as: Cytotec Place 4 tablets (800 mcg total) vaginally once for 1 dose. Place tablets in vagina for treatment of incomplete miscarriage.       Deanna Oats, MD OB Fellow, [facility] 02/24/2021 8:42 AM

## 2021-02-23 NOTE — MAU Note (Addendum)
PT SAYS SHE HAS BEEN BLEEDING  NO PREG TEST.  PAD ON IN TRIAGE- SATURATED WITH RED BLOOD- TO BATHROOM- TO CHANGE  ALSO CRAMPS- STARTED AT 2300.

## 2021-02-24 ENCOUNTER — Inpatient Hospital Stay (HOSPITAL_COMMUNITY): Payer: Medicaid Other

## 2021-02-24 DIAGNOSIS — O209 Hemorrhage in early pregnancy, unspecified: Secondary | ICD-10-CM | POA: Diagnosis not present

## 2021-02-24 DIAGNOSIS — N939 Abnormal uterine and vaginal bleeding, unspecified: Secondary | ICD-10-CM | POA: Diagnosis not present

## 2021-02-24 DIAGNOSIS — N8500 Endometrial hyperplasia, unspecified: Secondary | ICD-10-CM | POA: Diagnosis not present

## 2021-02-24 DIAGNOSIS — Z3A00 Weeks of gestation of pregnancy not specified: Secondary | ICD-10-CM | POA: Diagnosis not present

## 2021-02-24 LAB — COMPREHENSIVE METABOLIC PANEL
ALT: 11 U/L (ref 0–44)
AST: 16 U/L (ref 15–41)
Albumin: 3.6 g/dL (ref 3.5–5.0)
Alkaline Phosphatase: 64 U/L (ref 38–126)
Anion gap: 8 (ref 5–15)
BUN: 10 mg/dL (ref 6–20)
CO2: 24 mmol/L (ref 22–32)
Calcium: 8.8 mg/dL — ABNORMAL LOW (ref 8.9–10.3)
Chloride: 103 mmol/L (ref 98–111)
Creatinine, Ser: 0.6 mg/dL (ref 0.44–1.00)
GFR, Estimated: >60 mL/min (ref >60)
Glucose, Bld: 103 mg/dL — ABNORMAL HIGH (ref 70–99)
Potassium: 3.8 mmol/L (ref 3.5–5.1)
Sodium: 135 mmol/L (ref 135–145)
Total Bilirubin: 1 mg/dL (ref 0.3–1.2)
Total Protein: 7 g/dL (ref 6.5–8.1)

## 2021-02-24 LAB — CBC
HCT: 33.2 % — ABNORMAL LOW (ref 36.0–46.0)
Hemoglobin: 11.2 g/dL — ABNORMAL LOW (ref 12.0–15.0)
MCH: 28.8 pg (ref 26.0–34.0)
MCHC: 33.7 g/dL (ref 30.0–36.0)
MCV: 85.3 fL (ref 80.0–100.0)
Platelets: 242 10*3/uL (ref 150–400)
RBC: 3.89 MIL/uL (ref 3.87–5.11)
RDW: 13.2 % (ref 11.5–15.5)
WBC: 11.1 10*3/uL — ABNORMAL HIGH (ref 4.0–10.5)
nRBC: 0 % (ref 0.0–0.2)

## 2021-02-24 LAB — HCG, QUANTITATIVE, PREGNANCY: hCG, Beta Chain, Quant, S: 3549 m[IU]/mL — ABNORMAL HIGH (ref <5)

## 2021-02-24 MED ORDER — MISOPROSTOL 200 MCG PO TABS: 800.0000 ug | ORAL_TABLET | Freq: Once | ORAL | 0 refills | Status: DC

## 2021-02-24 MED ORDER — IBUPROFEN 200 MG PO TABS: 600.0000 mg | ORAL_TABLET | Freq: Three times a day (TID) | ORAL | 0 refills | Status: DC | PRN

## 2021-02-24 NOTE — Discharge Instructions (Signed)
Administer cytotec tablets vaginally to help place products of miscarriage. It will be important to follow-up in clinic in several days. We will contact you to schedule this appointment. Please return to the Maternity Assessment Unit sooner if concern of severe abdominal pain, continued heavy bleeding or other concerns.

## 2021-02-25 ENCOUNTER — Telehealth: Payer: Self-pay

## 2021-02-25 NOTE — Telephone Encounter (Signed)
Transition Care Management Unsuccessful Follow-up Telephone Call  Date of discharge and from where:  02/24/2021- [facility]  Attempts:  1st Attempt  Reason for unsuccessful TCM follow-up call:  Unable to reach patient    

## 2021-02-26 NOTE — Telephone Encounter (Signed)
Transition Care Management Unsuccessful Follow-up Telephone Call  Date of discharge and from where:  02/24/2021-[facility]  Attempts:  2nd Attempt  Reason for unsuccessful TCM follow-up call:  Unable to reach patient

## 2021-02-27 NOTE — Telephone Encounter (Signed)
Transition Care Management Unsuccessful Follow-up Telephone Call  Date of discharge and from where:  02/24/2021- [facility]  Attempts:  3rd Attempt  Reason for unsuccessful TCM follow-up call:  Unable to reach patient

## 2021-10-26 ENCOUNTER — Emergency Department (HOSPITAL_COMMUNITY): Payer: Medicaid Other

## 2021-10-26 ENCOUNTER — Emergency Department (HOSPITAL_BASED_OUTPATIENT_CLINIC_OR_DEPARTMENT_OTHER): Payer: Medicaid Other

## 2021-10-26 ENCOUNTER — Encounter (HOSPITAL_COMMUNITY): Payer: Self-pay

## 2021-10-26 ENCOUNTER — Emergency Department (HOSPITAL_COMMUNITY)
Admission: EM | Admit: 2021-10-26 | Discharge: 2021-10-26 | Disposition: A | Discharge status: Home / Self Care | Payer: Medicaid Other | Attending: Emergency Medicine | Admitting: Emergency Medicine

## 2021-10-26 DIAGNOSIS — R457 State of emotional shock and stress, unspecified: Secondary | ICD-10-CM | POA: Diagnosis not present

## 2021-10-26 DIAGNOSIS — M79662 Pain in left lower leg: Secondary | ICD-10-CM | POA: Diagnosis not present

## 2021-10-26 DIAGNOSIS — R0602 Shortness of breath: Secondary | ICD-10-CM | POA: Insufficient documentation

## 2021-10-26 DIAGNOSIS — F439 Reaction to severe stress, unspecified: Secondary | ICD-10-CM | POA: Diagnosis not present

## 2021-10-26 DIAGNOSIS — M79605 Pain in left leg: Secondary | ICD-10-CM

## 2021-10-26 DIAGNOSIS — O039 Complete or unspecified spontaneous abortion without complication: Secondary | ICD-10-CM | POA: Diagnosis not present

## 2021-10-26 DIAGNOSIS — R079 Chest pain, unspecified: Secondary | ICD-10-CM | POA: Diagnosis not present

## 2021-10-26 DIAGNOSIS — D649 Anemia, unspecified: Secondary | ICD-10-CM | POA: Insufficient documentation

## 2021-10-26 DIAGNOSIS — R0789 Other chest pain: Secondary | ICD-10-CM | POA: Diagnosis not present

## 2021-10-26 DIAGNOSIS — R0689 Other abnormalities of breathing: Secondary | ICD-10-CM | POA: Diagnosis not present

## 2021-10-26 LAB — BASIC METABOLIC PANEL
Anion gap: 9 (ref 5–15)
BUN: 12 mg/dL (ref 6–20)
CO2: 22 mmol/L (ref 22–32)
Calcium: 9 mg/dL (ref 8.9–10.3)
Chloride: 106 mmol/L (ref 98–111)
Creatinine, Ser: 0.7 mg/dL (ref 0.44–1.00)
GFR, Estimated: >60 mL/min (ref >60)
Glucose, Bld: 95 mg/dL (ref 70–99)
Potassium: 3.7 mmol/L (ref 3.5–5.1)
Sodium: 137 mmol/L (ref 135–145)

## 2021-10-26 LAB — CBC
HCT: 35.7 % — ABNORMAL LOW (ref 36.0–46.0)
Hemoglobin: 11 g/dL — ABNORMAL LOW (ref 12.0–15.0)
MCH: 25.1 pg — ABNORMAL LOW (ref 26.0–34.0)
MCHC: 30.8 g/dL (ref 30.0–36.0)
MCV: 81.3 fL (ref 80.0–100.0)
Platelets: 273 10*3/uL (ref 150–400)
RBC: 4.39 MIL/uL (ref 3.87–5.11)
RDW: 18.1 % — ABNORMAL HIGH (ref 11.5–15.5)
WBC: 5.5 10*3/uL (ref 4.0–10.5)
nRBC: 0 % (ref 0.0–0.2)

## 2021-10-26 LAB — TROPONIN I (HIGH SENSITIVITY)
Troponin I (High Sensitivity): 3 ng/L (ref <18)
Troponin I (High Sensitivity): 4 ng/L (ref <18)

## 2021-10-26 LAB — HCG, QUANTITATIVE, PREGNANCY: hCG, Beta Chain, Quant, S: <1 m[IU]/mL (ref <5)

## 2021-10-26 MED ORDER — IOHEXOL 350 MG/ML SOLN
70.0000 mL | Freq: Once | INTRAVENOUS | Status: AC | PRN
Start: 2021-10-26 — End: 2021-10-26
  Administered 2021-10-26: 70 mL via INTRAVENOUS

## 2021-10-26 MED ORDER — KETOROLAC TROMETHAMINE 15 MG/ML IJ SOLN
15.0000 mg | Freq: Once | INTRAMUSCULAR | Status: AC
  Administered 2021-10-26: 15 mg via INTRAVENOUS
  Filled 2021-10-26: qty 1

## 2021-10-26 NOTE — ED Notes (Signed)
Pt verbalized understanding of d/c instructions, meds and followup care. Denies questions. VSS, no distress noted. Steady gait to exit with all belongings.  ?

## 2021-10-26 NOTE — ED Provider Notes (Cosign Needed)
[facility] EMERGENCY DEPARTMENT Provider Note   CSN: [id number] Arrival date & time: 10/26/21  1254     History Past medical history of pulmonary embolism, aortic dissection, pregnancy complications. No chief complaint on file.   Deanna Sullivan is a 26 y.o. female.  Patient presents the emergency department with chief complaint of chest pain.  Chest pain has been present for 2 days.  States that the pain is across her upper chest and has been constant.    It is worse with taking deep breaths.She has associated shortness of breath.  She is concerned because she had a history of a pulmonary embolism.  She does not remember how it felt at that point though.  She is not currently on anticoagulation.  She is also concerned that she may be pregnant, however has not had a recent sure of this.  She also endorsed that she has had a lot of stress recently because of a recent death of her mother.  Of note, prior to the chest pain starting, she started having left lower extremity pain that is also been constant and is still present.  She denies any leg swelling.  Has not had any upper respiratory symptoms.  Denies cough.   HPI     Home Medications Prior to Admission medications   Medication Sig Start Date End Date Taking? Authorizing Provider  acetaminophen (TYLENOL) 325 MG tablet Take 325-650 mg by mouth every 6 (six) hours as needed (for pain).    [provider]  ibuprofen (ADVIL) 200 MG tablet Take 3 tablets (600 mg total) by mouth every 8 (eight) hours as needed for moderate pain or cramping. 02/24/21   Sheila Oats, MD  misoprostol (CYTOTEC) 200 MCG tablet Place 4 tablets (800 mcg total) vaginally once for 1 dose. Place tablets in vagina for treatment of incomplete miscarriage. 02/24/21 02/24/21  Sheila Oats, MD      Allergies    Patient has no known allergies.    Review of Systems   Review of Systems  Respiratory:  Positive for chest tightness and shortness  of breath.   Cardiovascular:  Positive for chest pain.       Left leg pain  Psychiatric/Behavioral:  The patient is nervous/anxious.   All other systems reviewed and are negative.  Physical Exam Updated Vital Signs BP 106/66    Pulse 68    Temp 98.4 F (36.9 C) (Oral)    Resp (!) 21    LMP  (LMP Unknown) Comment: about a month ago   SpO2 99%    Breastfeeding Unknown  Physical Exam Vitals and nursing note reviewed.  Constitutional:      General: She is not in acute distress.    Appearance: Normal appearance. She is not ill-appearing, toxic-appearing or diaphoretic.  HENT:     Head: Normocephalic and atraumatic.     Nose: No nasal deformity.     Mouth/Throat:     Lips: Pink. No lesions.     Mouth: Mucous membranes are moist. No injury, lacerations, oral lesions or angioedema.     Pharynx: Oropharynx is clear. Uvula midline. No pharyngeal swelling, oropharyngeal exudate, posterior oropharyngeal erythema or uvula swelling.  Eyes:     General: Gaze aligned appropriately. No scleral icterus.       Right eye: No discharge.        Left eye: No discharge.     Conjunctiva/sclera: Conjunctivae normal.     Right eye: Right conjunctiva is not injected.  No exudate or hemorrhage.    Left eye: Left conjunctiva is not injected. No exudate or hemorrhage.    Pupils: Pupils are equal, round, and reactive to light.  Cardiovascular:     Rate and Rhythm: Normal rate and regular rhythm.     Pulses: Normal pulses.          Radial pulses are 2+ on the right side and 2+ on the left side.       Dorsalis pedis pulses are 2+ on the right side and 2+ on the left side.     Heart sounds: Normal heart sounds, S1 normal and S2 normal. Heart sounds not distant. No murmur heard.   No friction rub. No gallop. No S3 or S4 sounds.  Pulmonary:     Effort: Pulmonary effort is normal. No accessory muscle usage or respiratory distress.     Breath sounds: Normal breath sounds. No stridor. No wheezing, rhonchi or rales.   Chest:     Chest wall: No tenderness.  Abdominal:     General: Abdomen is flat. There is no distension.     Palpations: Abdomen is soft. There is no mass or pulsatile mass.     Tenderness: There is no abdominal tenderness. There is no guarding or rebound.  Musculoskeletal:     Right lower leg: No edema.     Left lower leg: No edema.     Comments: Left lower extremity is very tender to the touch and light palpation.  It is tender all the way up to the calf.  No evidence of trauma.  Skin:    General: Skin is warm and dry.     Coloration: Skin is not jaundiced or pale.     Findings: No bruising, erythema, lesion or rash.  Neurological:     General: No focal deficit present.     Mental Status: She is alert and oriented to person, place, and time.     GCS: GCS eye subscore is 4. GCS verbal subscore is 5. GCS motor subscore is 6.  Psychiatric:        Mood and Affect: Mood normal.        Behavior: Behavior normal. Behavior is cooperative.    ED Results / Procedures / Treatments   Labs (all labs ordered are listed, but only abnormal results are displayed) Labs Reviewed  CBC - Abnormal; Notable for the following components:      Result Value   Hemoglobin 11.0 (*)    HCT 35.7 (*)    MCH 25.1 (*)    RDW 18.1 (*)    All other components within normal limits  BASIC METABOLIC PANEL  HCG, QUANTITATIVE, PREGNANCY  TROPONIN I (HIGH SENSITIVITY)  TROPONIN I (HIGH SENSITIVITY)    EKG EKG Interpretation  Date/Time:  Monday October 26 2021 13:02:29 EDT Ventricular Rate:  64 PR Interval:  149 QRS Duration: 90 QT Interval:  422 QTC Calculation: 436 R Axis:   69 Text Interpretation: Sinus rhythm Confirmed by Gloris Manchester [id number]) on 10/26/2021 1:10:11 PM  Radiology DG Chest Port 1 View  Result Date: 10/26/2021 CLINICAL DATA:  Chest pain EXAM: PORTABLE CHEST 1 VIEW COMPARISON:  03/14/2018 FINDINGS: The heart size and mediastinal contours are within normal limits. Both lungs are clear. The  visualized skeletal structures are unremarkable. IMPRESSION: No active disease. Electronically Signed   By: Acquanetta Belling M.D.   On: 10/26/2021 13:40   VAS [place] LOWER EXTREMITY VENOUS (DVT) (7a-7p)  Result Date: 10/26/2021  Lower Venous  DVT Study Patient Name:  Deanna Sullivan  Date of Exam:   10/26/2021 Medical Rec #: [id number]       Accession #:    [id number] Date of Birth: [birth year]       Patient Gender: F Patient Age:   25 years Exam Location:  [facility] Procedure:      VAS [place] LOWER EXTREMITY VENOUS (DVT) Referring Phys: Roxanna Mcever --------------------------------------------------------------------------------  Indications: Pain.  Comparison Study: Previous exam (LLEV) on 05/04/18 was negative for DVT Performing Technologist: Ernestene Mention RVT, RDMS  Examination Guidelines: A complete evaluation includes B-mode imaging, spectral Doppler, color Doppler, and power Doppler as needed of all accessible portions of each vessel. Bilateral testing is considered an integral part of a complete examination. Limited examinations for reoccurring indications may be performed as noted. The reflux portion of the exam is performed with the patient in reverse Trendelenburg.  +-----+---------------+---------+-----------+----------+--------------+  RIGHT Compressibility Phasicity Spontaneity Properties Thrombus Aging  +-----+---------------+---------+-----------+----------+--------------+  CFV   Full            Yes       Yes                                    +-----+---------------+---------+-----------+----------+--------------+   +---------+---------------+---------+-----------+----------+--------------+  LEFT      Compressibility Phasicity Spontaneity Properties Thrombus Aging  +---------+---------------+---------+-----------+----------+--------------+  CFV       Full            Yes       Yes                                    +---------+---------------+---------+-----------+----------+--------------+  SFJ       Full                                                              +---------+---------------+---------+-----------+----------+--------------+  FV Prox   Full            Yes       Yes                                    +---------+---------------+---------+-----------+----------+--------------+  FV Mid    Full            Yes       Yes                                    +---------+---------------+---------+-----------+----------+--------------+  FV Distal Full            Yes       Yes                                    +---------+---------------+---------+-----------+----------+--------------+  PFV       Full                                                             +---------+---------------+---------+-----------+----------+--------------+  POP       Full            Yes       Yes                                    +---------+---------------+---------+-----------+----------+--------------+  PTV       Full                                                             +---------+---------------+---------+-----------+----------+--------------+  PERO      Full                                                             +---------+---------------+---------+-----------+----------+--------------+     Summary: RIGHT: - No evidence of common femoral vein obstruction.  LEFT: - There is no evidence of deep vein thrombosis in the lower extremity. - There is no evidence of superficial venous thrombosis.  - No cystic structure found in the popliteal fossa.  *See table(s) above for measurements and observations.    Preliminary     Procedures Procedures  This patient was on telemetry or cardiac monitoring during their time in the ED.    Medications Ordered in ED Medications - No data to display  ED Course/ Medical Decision Making/ A&P Clinical Course as of 10/26/21 1528  Mon Oct 26, 2021  1343 CXR with no acute findings [GL]    Clinical Course User Index [GL] Victorino Dike Finis Bud, PA-C                           Medical Decision  Making Amount and/or Complexity of Data Reviewed Labs: ordered. Radiology: ordered.    MDM  This is a 26 y.o. female with a pertinent PMH of PE who presents to the ED with chest pain for two days with associated shortness of breath and left leg pain.   Vitals stable. Exam with notable left lower leg pain.  This patient reportedly has a history of pulmonary embolism.  She is not on anticoagulation.  She has pretty high risk for her Wells score and she has left leg pain that could be concerning for DVT, no more likely cause to her symptoms, and reported history of PE.  She is not tachycardic.  Plan to proceed with CTA chest PE study.  Per chart review, patient also has a history of aortic dissection.  I do not think this is likely.  She also states that she could be pregnant, will order pregnancy test. I have a low suspicion for ACS. EKG and chest pain labs are ordered. DVT study for left leg also ordered.  I personally ordered, reviewed, and interpreted all laboratory work and imaging and agree with radiologist interpretation. Results interpreted below: CBC with stable anemia, BMP unremarkable. Troponin negative. Dopplers of LLE negative for DVT. CXR negative. EKG NSR. Pregnancy test and CTA PE study pending  3:28 PM Care of Deanna Sullivan transferred to [facility] and Dr. Rodena Medin at  the end of my shift as the patient will require reassessment once labs/imaging have resulted. Patient presentation, ED course, and plan of care discussed with review of all pertinent labs and imaging. Please see his/her note for further details regarding further ED course and disposition. Plan at time of handoff is f/u on pregnancy and CT. This may be altered or completely changed at the discretion of the oncoming team pending results of further workup.    Charting Requirements Additional history is obtained from:  Independent historian External Records from outside source obtained and reviewed including: recent  admissions for OB and ED visits Social Determinants of Health:  none Pertinant PMH that complicates patient's illness: hx of PE and aortic dissection  Patient Care Problems that were addressed during this visit: - Chest pain: Acute illness with systemic symptoms This patient was maintained on a cardiac monitor/telemetry. I personally viewed and interpreted the cardiac monitor which reveals an underlying rhythm of NSR Disposition: see next provider note  Portions of this note were generated with Dragon dictation software. Dictation errors may occur despite best attempts at proofreading.    Final Clinical Impression(s) / ED Diagnoses Final diagnoses:  None    Rx / DC Orders ED Discharge Orders     None         Claudie Leach, PA-C 10/26/21 1552

## 2021-10-26 NOTE — ED Triage Notes (Signed)
Pt from home brought in [facility] for chest pain and SHOB. Pt reports SHOB and continuous cp for 2 days all over upper chest. Pt concerned for blood clots with a hx of clots. Not on anticoagulants. Pt possibly 5-[redacted] weeks pregnant. HR 60.  ?

## 2021-10-26 NOTE — ED Provider Notes (Cosign Needed)
5:45 PM Care assumed at shift change from Loeffler, PA-C.  Pending CTA to rule out pulmonary embolus.  CTA has been personally visualized without evidence of large, central PE.  Radiologist read notes no filling defect to suggest pulmonary embolus.  Agree with radiologist interpretation.  No acute cardiopulmonary abnormality to explain the patient's chest pain.  Given her age, lack of risk factors, stable vital signs and reassuring ED evaluation, feel she is appropriate for continued outpatient primary care follow-up.  Of note, patient was operating under the assumption that she has a history of aortic dissection, pulmonary embolus, and DVT during a hospitalization in 2016.  Upon extensive chart review, it appears that patient did not have any of these aforementioned processes.    It does appear that she was hospitalized for being critically ill secondary to septic shock as a result of E. coli bacteremia from pyelonephritis on 05/03/15.  She also developed postpartum cardiomyopathy and had to undergo operative management for retained products of conception and endometritis on 04/22/15.  She did have imaging studies on 05/06/15 to assess for aortic dissection and VTE, including CT dissection study, VQ scan, venous duplex ultrasound; however, all of these imaging studies were negative with respect to these diagnoses.  Patient has been updated with regards to these findings as they pertain to her medical history.  Return precautions discussed and provided. Patient discharged in stable condition with no unaddressed concerns.   Vitals:   10/26/21 1630 10/26/21 1645 10/26/21 1700 10/26/21 1800  BP: 102/65 104/75 (!) 121/94 120/75  Pulse: 68 72 77 75  Resp: 12 (!) 21 18 16   Temp:    98.6 F (37 C)  TempSrc:    Oral  SpO2: 100% 95% 99% 99%    CT Angio Chest PE W/Cm &/Or Wo Cm  Result Date: 10/26/2021 CLINICAL DATA:  Shortness of breath and chest pain EXAM: CT ANGIOGRAPHY CHEST WITH CONTRAST TECHNIQUE:  Multidetector CT imaging of the chest was performed using the standard protocol during bolus administration of intravenous contrast. Multiplanar CT image reconstructions and MIPs were obtained to evaluate the vascular anatomy. RADIATION DOSE REDUCTION: This exam was performed according to the departmental dose-optimization program which includes automated exposure control, adjustment of the mA and/or kV according to patient size and/or use of iterative reconstruction technique. CONTRAST:  36mL OMNIPAQUE IOHEXOL 350 MG/ML SOLN COMPARISON:  Chest radiograph 10/26/2021 and prior chest CTA from 02/13/2018 FINDINGS: Cardiovascular: No filling defect is identified in the pulmonary arterial tree to suggest pulmonary embolus. No acute vascular findings. Mediastinum/Nodes: Unremarkable Lungs/Pleura: Unremarkable Upper Abdomen: Unremarkable Musculoskeletal: Unremarkable Review of the MIP images confirms the above findings. IMPRESSION: 1. No filling defect is identified in the pulmonary arterial tree to suggest pulmonary embolus. No specific abnormal findings to account for the patient's chest pain and shortness of breath. Electronically Signed   By: 04/16/2018 M.D.   On: 10/26/2021 17:33   DG Chest Port 1 View  Result Date: 10/26/2021 CLINICAL DATA:  Chest pain EXAM: PORTABLE CHEST 1 VIEW COMPARISON:  03/14/2018 FINDINGS: The heart size and mediastinal contours are within normal limits. Both lungs are clear. The visualized skeletal structures are unremarkable. IMPRESSION: No active disease. Electronically Signed   By: 03/16/2018 M.D.   On: 10/26/2021 13:40   VAS 10/28/2021 LOWER EXTREMITY VENOUS (DVT) (7a-7p)  Result Date: 10/26/2021  Lower Venous DVT Study Patient Name:  Deanna Sullivan  Date of Exam:   10/26/2021 Medical Rec #: 10/28/2021  Accession #:    [id number] Date of Birth: [birth year]       Patient Gender: F Patient Age:   25 years Exam Location:  [facility] Procedure:      VAS [place] LOWER EXTREMITY  VENOUS (DVT) Referring Phys: GRACE LOEFFLER --------------------------------------------------------------------------------  Indications: Pain.  Comparison Study: Previous exam (LLEV) on 05/04/18 was negative for DVT Performing Technologist: Ernestene Mention RVT, RDMS  Examination Guidelines: A complete evaluation includes B-mode imaging, spectral Doppler, color Doppler, and power Doppler as needed of all accessible portions of each vessel. Bilateral testing is considered an integral part of a complete examination. Limited examinations for reoccurring indications may be performed as noted. The reflux portion of the exam is performed with the patient in reverse Trendelenburg.  +-----+---------------+---------+-----------+----------+--------------+  RIGHT Compressibility Phasicity Spontaneity Properties Thrombus Aging  +-----+---------------+---------+-----------+----------+--------------+  CFV   Full            Yes       Yes                                    +-----+---------------+---------+-----------+----------+--------------+   +---------+---------------+---------+-----------+----------+--------------+  LEFT      Compressibility Phasicity Spontaneity Properties Thrombus Aging  +---------+---------------+---------+-----------+----------+--------------+  CFV       Full            Yes       Yes                                    +---------+---------------+---------+-----------+----------+--------------+  SFJ       Full                                                             +---------+---------------+---------+-----------+----------+--------------+  FV Prox   Full            Yes       Yes                                    +---------+---------------+---------+-----------+----------+--------------+  FV Mid    Full            Yes       Yes                                    +---------+---------------+---------+-----------+----------+--------------+  FV Distal Full            Yes       Yes                                     +---------+---------------+---------+-----------+----------+--------------+  PFV       Full                                                             +---------+---------------+---------+-----------+----------+--------------+  POP       Full            Yes       Yes                                    +---------+---------------+---------+-----------+----------+--------------+  PTV       Full                                                             +---------+---------------+---------+-----------+----------+--------------+  PERO      Full                                                             +---------+---------------+---------+-----------+----------+--------------+     Summary: RIGHT: - No evidence of common femoral vein obstruction.  LEFT: - There is no evidence of deep vein thrombosis in the lower extremity. - There is no evidence of superficial venous thrombosis.  - No cystic structure found in the popliteal fossa.  *See table(s) above for measurements and observations.    Preliminary       Antony Madura, PA-C 10/26/21 1804

## 2021-10-26 NOTE — Discharge Instructions (Addendum)
Your work-up in the emergency department today did not show any concerning cause of your chest pain.  We recommend follow-up with your primary care doctor.  Take Tylenol or ibuprofen for any residual discomfort.  You may return for new or concerning symptoms. ?

## 2021-10-26 NOTE — Progress Notes (Signed)
LLE venous duplex has been completed.  Preliminary results given to Theophilus Kinds, PA-C. ? ? ?Results can be found under chart review under CV PROC. ?10/26/2021 3:12 PM ?Arianni Gallego RVT, RDMS ? ?

## 2021-10-27 ENCOUNTER — Telehealth: Payer: Self-pay

## 2021-10-27 NOTE — Telephone Encounter (Signed)
Transition Care Management Unsuccessful Follow-up Telephone Call ? ?Date of discharge and from where:  10/26/2021 from [facility] ? ?Attempts:  1st Attempt ? ?Reason for unsuccessful TCM follow-up call:  Left voice message ? ?. ?

## 2021-10-29 NOTE — Telephone Encounter (Signed)
Transition Care Management Follow-up Telephone Call ?Date of discharge and from where: 10/26/2021 from [facility] ?How have you been since you were released from the hospital? Patient stated that she has been doing okay. Patient did not have any questions or concerns at this time.  ?Any questions or concerns? No ? ?Items Reviewed: ?Did the pt receive and understand the discharge instructions provided? Yes  ?Medications obtained and verified? Yes  ?Other? No  ?Any new allergies since your discharge? No  ?Dietary orders reviewed? No ?Do you have support at home? Yes  ? ?Functional Questionnaire: (I = Independent and D = Dependent) ?ADLs: I ? ?Bathing/Dressing- I ? ?Meal Prep- I ? ?Eating- I ? ?Maintaining continence- I ? ?Transferring/Ambulation- I ? ?Managing Meds- I ? ? ?Follow up appointments reviewed: ? ?PCP Hospital f/u appt confirmed? No  Patient is calling PCP for a follow up appt.  ?Specialist Hospital f/u appt confirmed? No   ?Are transportation arrangements needed? No  ?If their condition worsens, is the pt aware to call PCP or go to the Emergency Dept.? Yes ?Was the patient provided with contact information for the PCP's office or ED? Yes ?Was to pt encouraged to call back with questions or concerns? Yes ? ?

## 2022-03-09 ENCOUNTER — Ambulatory Visit: Payer: Medicaid Other

## 2022-04-16 DIAGNOSIS — F410 Panic disorder [episodic paroxysmal anxiety] without agoraphobia: Secondary | ICD-10-CM | POA: Diagnosis not present

## 2022-04-16 DIAGNOSIS — F330 Major depressive disorder, recurrent, mild: Secondary | ICD-10-CM | POA: Diagnosis not present

## 2022-04-23 DIAGNOSIS — F330 Major depressive disorder, recurrent, mild: Secondary | ICD-10-CM | POA: Diagnosis not present

## 2022-04-23 DIAGNOSIS — F410 Panic disorder [episodic paroxysmal anxiety] without agoraphobia: Secondary | ICD-10-CM | POA: Diagnosis not present

## 2022-04-30 DIAGNOSIS — F330 Major depressive disorder, recurrent, mild: Secondary | ICD-10-CM | POA: Diagnosis not present

## 2022-04-30 DIAGNOSIS — F410 Panic disorder [episodic paroxysmal anxiety] without agoraphobia: Secondary | ICD-10-CM | POA: Diagnosis not present

## 2022-05-07 DIAGNOSIS — F330 Major depressive disorder, recurrent, mild: Secondary | ICD-10-CM | POA: Diagnosis not present

## 2022-05-07 DIAGNOSIS — F410 Panic disorder [episodic paroxysmal anxiety] without agoraphobia: Secondary | ICD-10-CM | POA: Diagnosis not present

## 2022-05-14 DIAGNOSIS — F410 Panic disorder [episodic paroxysmal anxiety] without agoraphobia: Secondary | ICD-10-CM | POA: Diagnosis not present

## 2022-05-14 DIAGNOSIS — F330 Major depressive disorder, recurrent, mild: Secondary | ICD-10-CM | POA: Diagnosis not present

## 2022-05-21 DIAGNOSIS — F330 Major depressive disorder, recurrent, mild: Secondary | ICD-10-CM | POA: Diagnosis not present

## 2022-05-21 DIAGNOSIS — F410 Panic disorder [episodic paroxysmal anxiety] without agoraphobia: Secondary | ICD-10-CM | POA: Diagnosis not present

## 2022-05-28 DIAGNOSIS — F330 Major depressive disorder, recurrent, mild: Secondary | ICD-10-CM | POA: Diagnosis not present

## 2022-05-28 DIAGNOSIS — F410 Panic disorder [episodic paroxysmal anxiety] without agoraphobia: Secondary | ICD-10-CM | POA: Diagnosis not present

## 2022-06-04 DIAGNOSIS — F330 Major depressive disorder, recurrent, mild: Secondary | ICD-10-CM | POA: Diagnosis not present

## 2022-06-04 DIAGNOSIS — F410 Panic disorder [episodic paroxysmal anxiety] without agoraphobia: Secondary | ICD-10-CM | POA: Diagnosis not present

## 2022-06-11 DIAGNOSIS — F330 Major depressive disorder, recurrent, mild: Secondary | ICD-10-CM | POA: Diagnosis not present

## 2022-06-11 DIAGNOSIS — F410 Panic disorder [episodic paroxysmal anxiety] without agoraphobia: Secondary | ICD-10-CM | POA: Diagnosis not present

## 2022-06-18 DIAGNOSIS — F410 Panic disorder [episodic paroxysmal anxiety] without agoraphobia: Secondary | ICD-10-CM | POA: Diagnosis not present

## 2022-06-18 DIAGNOSIS — F330 Major depressive disorder, recurrent, mild: Secondary | ICD-10-CM | POA: Diagnosis not present

## 2022-06-25 DIAGNOSIS — F330 Major depressive disorder, recurrent, mild: Secondary | ICD-10-CM | POA: Diagnosis not present

## 2022-06-25 DIAGNOSIS — F410 Panic disorder [episodic paroxysmal anxiety] without agoraphobia: Secondary | ICD-10-CM | POA: Diagnosis not present

## 2022-07-02 DIAGNOSIS — F410 Panic disorder [episodic paroxysmal anxiety] without agoraphobia: Secondary | ICD-10-CM | POA: Diagnosis not present

## 2022-07-02 DIAGNOSIS — F330 Major depressive disorder, recurrent, mild: Secondary | ICD-10-CM | POA: Diagnosis not present

## 2022-07-09 DIAGNOSIS — F330 Major depressive disorder, recurrent, mild: Secondary | ICD-10-CM | POA: Diagnosis not present

## 2022-07-09 DIAGNOSIS — F410 Panic disorder [episodic paroxysmal anxiety] without agoraphobia: Secondary | ICD-10-CM | POA: Diagnosis not present

## 2022-07-16 DIAGNOSIS — F410 Panic disorder [episodic paroxysmal anxiety] without agoraphobia: Secondary | ICD-10-CM | POA: Diagnosis not present

## 2022-07-16 DIAGNOSIS — F330 Major depressive disorder, recurrent, mild: Secondary | ICD-10-CM | POA: Diagnosis not present

## 2022-07-23 DIAGNOSIS — F410 Panic disorder [episodic paroxysmal anxiety] without agoraphobia: Secondary | ICD-10-CM | POA: Diagnosis not present

## 2022-07-23 DIAGNOSIS — F330 Major depressive disorder, recurrent, mild: Secondary | ICD-10-CM | POA: Diagnosis not present

## 2022-07-30 DIAGNOSIS — F330 Major depressive disorder, recurrent, mild: Secondary | ICD-10-CM | POA: Diagnosis not present

## 2022-07-30 DIAGNOSIS — F410 Panic disorder [episodic paroxysmal anxiety] without agoraphobia: Secondary | ICD-10-CM | POA: Diagnosis not present

## 2022-08-13 DIAGNOSIS — F330 Major depressive disorder, recurrent, mild: Secondary | ICD-10-CM | POA: Diagnosis not present

## 2022-08-13 DIAGNOSIS — F410 Panic disorder [episodic paroxysmal anxiety] without agoraphobia: Secondary | ICD-10-CM | POA: Diagnosis not present

## 2023-02-07 ENCOUNTER — Other Ambulatory Visit: Payer: Self-pay

## 2023-02-07 ENCOUNTER — Emergency Department (HOSPITAL_COMMUNITY)
Admission: EM | Admit: 2023-02-07 | Discharge: 2023-02-08 | Discharge status: Against Medical Advice | Payer: 59 | Attending: Emergency Medicine | Admitting: Emergency Medicine

## 2023-02-07 DIAGNOSIS — Z5321 Procedure and treatment not carried out due to patient leaving prior to being seen by health care provider: Secondary | ICD-10-CM | POA: Diagnosis not present

## 2023-02-07 DIAGNOSIS — J029 Acute pharyngitis, unspecified: Secondary | ICD-10-CM | POA: Diagnosis not present

## 2023-02-07 DIAGNOSIS — Z1152 Encounter for screening for COVID-19: Secondary | ICD-10-CM | POA: Diagnosis not present

## 2023-02-07 LAB — SARS CORONAVIRUS 2 BY RT PCR: SARS Coronavirus 2 by RT PCR: NEGATIVE

## 2023-02-07 LAB — GROUP A STREP BY PCR: Group A Strep by PCR: DETECTED — AB

## 2023-02-07 MED ORDER — ACETAMINOPHEN 500 MG PO TABS
1000.0000 mg | ORAL_TABLET | Freq: Once | ORAL | Status: DC
  Filled 2023-02-07: qty 2

## 2023-02-07 NOTE — ED Triage Notes (Signed)
Pt presents with severe sore throat x 2 days.  Mainly on the right. States it is hard to talk.  Pt reports hx of strep but states "this isn't that"

## 2023-02-08 NOTE — ED Notes (Signed)
Pt called 3x no answer  

## 2023-02-18 ENCOUNTER — Emergency Department (HOSPITAL_COMMUNITY): Payer: 59

## 2023-02-18 ENCOUNTER — Emergency Department (HOSPITAL_COMMUNITY)
Admission: EM | Admit: 2023-02-18 | Discharge: 2023-02-18 | Disposition: A | Discharge status: Home / Self Care | Payer: 59 | Source: Home / Self Care | Attending: Emergency Medicine | Admitting: Emergency Medicine

## 2023-02-18 DIAGNOSIS — D72829 Elevated white blood cell count, unspecified: Secondary | ICD-10-CM | POA: Diagnosis not present

## 2023-02-18 DIAGNOSIS — N3001 Acute cystitis with hematuria: Secondary | ICD-10-CM

## 2023-02-18 DIAGNOSIS — Z1152 Encounter for screening for COVID-19: Secondary | ICD-10-CM | POA: Diagnosis not present

## 2023-02-18 DIAGNOSIS — R509 Fever, unspecified: Secondary | ICD-10-CM | POA: Diagnosis present

## 2023-02-18 DIAGNOSIS — J020 Streptococcal pharyngitis: Secondary | ICD-10-CM

## 2023-02-18 DIAGNOSIS — R000 Tachycardia, unspecified: Secondary | ICD-10-CM | POA: Diagnosis not present

## 2023-02-18 LAB — CBC WITH DIFFERENTIAL/PLATELET
Abs Immature Granulocytes: 0.03 10*3/uL (ref 0.00–0.07)
Basophils Absolute: 0 10*3/uL (ref 0.0–0.1)
Basophils Relative: 0 %
Eosinophils Absolute: 0 10*3/uL (ref 0.0–0.5)
Eosinophils Relative: 0 %
HCT: 36.8 % (ref 36.0–46.0)
Hemoglobin: 12.1 g/dL (ref 12.0–15.0)
Immature Granulocytes: 0 %
Lymphocytes Relative: 15 %
Lymphs Abs: 1.8 10*3/uL (ref 0.7–4.0)
MCH: 28.1 pg (ref 26.0–34.0)
MCHC: 32.9 g/dL (ref 30.0–36.0)
MCV: 85.6 fL (ref 80.0–100.0)
Monocytes Absolute: 0.8 10*3/uL (ref 0.1–1.0)
Monocytes Relative: 7 %
Neutro Abs: 9.3 10*3/uL — ABNORMAL HIGH (ref 1.7–7.7)
Neutrophils Relative %: 78 %
Platelets: 304 10*3/uL (ref 150–400)
RBC: 4.3 MIL/uL (ref 3.87–5.11)
RDW: 15.2 % (ref 11.5–15.5)
WBC: 12 10*3/uL — ABNORMAL HIGH (ref 4.0–10.5)
nRBC: 0 % (ref 0.0–0.2)

## 2023-02-18 LAB — HCG, QUANTITATIVE, PREGNANCY: hCG, Beta Chain, Quant, S: <1 m[IU]/mL (ref <5)

## 2023-02-18 LAB — COMPREHENSIVE METABOLIC PANEL
ALT: 57 U/L — ABNORMAL HIGH (ref 0–44)
AST: 80 U/L — ABNORMAL HIGH (ref 15–41)
Albumin: 3.5 g/dL (ref 3.5–5.0)
Alkaline Phosphatase: 87 U/L (ref 38–126)
Anion gap: 14 (ref 5–15)
BUN: 5 mg/dL — ABNORMAL LOW (ref 6–20)
CO2: 20 mmol/L — ABNORMAL LOW (ref 22–32)
Calcium: 9.1 mg/dL (ref 8.9–10.3)
Chloride: 99 mmol/L (ref 98–111)
Creatinine, Ser: 0.7 mg/dL (ref 0.44–1.00)
GFR, Estimated: >60 mL/min (ref >60)
Glucose, Bld: 114 mg/dL — ABNORMAL HIGH (ref 70–99)
Potassium: 3.2 mmol/L — ABNORMAL LOW (ref 3.5–5.1)
Sodium: 133 mmol/L — ABNORMAL LOW (ref 135–145)
Total Bilirubin: 0.8 mg/dL (ref 0.3–1.2)
Total Protein: 7.9 g/dL (ref 6.5–8.1)

## 2023-02-18 LAB — URINALYSIS, MICROSCOPIC (REFLEX)

## 2023-02-18 LAB — GROUP A STREP BY PCR: Group A Strep by PCR: DETECTED — AB

## 2023-02-18 LAB — URINALYSIS, ROUTINE W REFLEX MICROSCOPIC
Bilirubin Urine: NEGATIVE
Glucose, UA: NEGATIVE mg/dL
Ketones, ur: 15 mg/dL — AB
Nitrite: POSITIVE — AB
Protein, ur: NEGATIVE mg/dL
Specific Gravity, Urine: 1.01 (ref 1.005–1.030)
pH: 6 (ref 5.0–8.0)

## 2023-02-18 LAB — SARS CORONAVIRUS 2 BY RT PCR: SARS Coronavirus 2 by RT PCR: NEGATIVE

## 2023-02-18 LAB — LACTIC ACID, PLASMA: Lactic Acid, Venous: 0.8 mmol/L (ref 0.5–1.9)

## 2023-02-18 MED ORDER — CEPHALEXIN 250 MG PO CAPS
500.0000 mg | ORAL_CAPSULE | Freq: Once | ORAL | Status: DC
Start: 2023-02-18 — End: 2023-02-18

## 2023-02-18 MED ORDER — CEPHALEXIN 250 MG PO CAPS
500.0000 mg | ORAL_CAPSULE | Freq: Once | ORAL | Status: AC
Start: 2023-02-18 — End: 2023-02-18
  Administered 2023-02-18: 500 mg via ORAL
  Filled 2023-02-18: qty 2

## 2023-02-18 MED ORDER — CEPHALEXIN 500 MG PO CAPS: 500.0000 mg | ORAL_CAPSULE | Freq: Three times a day (TID) | ORAL | 0 refills | Status: DC

## 2023-02-18 MED ORDER — ACETAMINOPHEN 325 MG PO TABS
650.0000 mg | ORAL_TABLET | Freq: Four times a day (QID) | ORAL | Status: DC | PRN
  Administered 2023-02-18: 650 mg via ORAL
  Filled 2023-02-18: qty 2

## 2023-02-18 NOTE — ED Triage Notes (Signed)
Pt coming in with generlized weakness, sore throat, fever, cough, body aches for 2 days. Pt hasn't taken tylenol or motrin but reports a higher fever at home then the current fever of 100.2 here. Pt also endorses chest pain and shortness of breath with other symptoms provided.

## 2023-02-18 NOTE — Discharge Instructions (Signed)
You were evaluated today and diagnosed with a urinary tract infection and strep throat.  Please take the prescribed antibiotics to help illuminate the infections.  I recommend taking over-the-counter ibuprofen and Tylenol for fever and pain control.  You may take 600 mg of ibuprofen every 6 hours and may take up to 1000 mg of Tylenol every 6 hours.  If you develop any life-threatening symptoms please return to the emergency department.

## 2023-02-18 NOTE — ED Provider Notes (Signed)
[place] EMERGENCY DEPARTMENT AT [facility] Provider Note   CSN: [id number] Arrival date & time: 02/18/23  0118     History  Chief Complaint  Patient presents with   Fever    Deanna Sullivan is a 27 y.o. female.  Patient presents to the emerged department complaining of bodyaches, cough, fever, weakness for the past 2 days.  Patient also endorses sore throat.  She denies taking any medication at home for fever control.  Upon arrival at the emergency department patient is febrile with a temperature of 100.2 F and is tachycardic with a rate of 118.  Patient also endorses dysuria.  Patient denies chest pain, shortness of breath, abdominal pain, nausea, vomiting.  Past medical history significant for anemia, pyelonephritis, tachycardia  HPI     Home Medications Prior to Admission medications   Medication Sig Start Date End Date Taking? Authorizing Provider  cephALEXin (KEFLEX) 500 MG capsule Take 1 capsule (500 mg total) by mouth 3 (three) times daily. 02/18/23  Yes Darrick Grinder, PA-C  ibuprofen (ADVIL) 200 MG tablet Take 3 tablets (600 mg total) by mouth every 8 (eight) hours as needed for moderate pain or cramping. Patient not taking: Reported on 10/26/2021 02/24/21   Sheila Oats, MD  misoprostol (CYTOTEC) 200 MCG tablet Place 4 tablets (800 mcg total) vaginally once for 1 dose. Place tablets in vagina for treatment of incomplete miscarriage. 02/24/21 02/24/21  Sheila Oats, MD      Allergies    Patient has no known allergies.    Review of Systems   Review of Systems  Physical Exam Updated Vital Signs BP 113/81   Pulse (!) 118   Temp 100.2 F (37.9 C)   Resp 19   SpO2 100%  Physical Exam Vitals and nursing note reviewed.  Constitutional:      General: She is not in acute distress.    Appearance: She is well-developed.  HENT:     Head: Normocephalic and atraumatic.     Right Ear: Tympanic membrane normal.     Left Ear: Tympanic membrane normal.      Ears:     Comments: Right ear canal with excess cerumen Eyes:     Conjunctiva/sclera: Conjunctivae normal.  Cardiovascular:     Rate and Rhythm: Normal rate and regular rhythm.     Heart sounds: No murmur heard. Pulmonary:     Effort: Pulmonary effort is normal. No respiratory distress.     Breath sounds: Normal breath sounds.  Abdominal:     Palpations: Abdomen is soft.     Tenderness: There is no abdominal tenderness.  Musculoskeletal:        General: No swelling.     Cervical back: Neck supple.  Skin:    General: Skin is warm and dry.     Capillary Refill: Capillary refill takes less than 2 seconds.  Neurological:     Mental Status: She is alert.  Psychiatric:        Mood and Affect: Mood normal.     ED Results / Procedures / Treatments   Labs (all labs ordered are listed, but only abnormal results are displayed) Labs Reviewed  GROUP A STREP BY PCR - Abnormal; Notable for the following components:      Result Value   Group A Strep by PCR DETECTED (*)    All other components within normal limits  COMPREHENSIVE METABOLIC PANEL - Abnormal; Notable for the following components:   Sodium 133 (*)  Potassium 3.2 (*)    CO2 20 (*)    Glucose, Bld 114 (*)    BUN 5 (*)    AST 80 (*)    ALT 57 (*)    All other components within normal limits  CBC WITH DIFFERENTIAL/PLATELET - Abnormal; Notable for the following components:   WBC 12.0 (*)    Neutro Abs 9.3 (*)    All other components within normal limits  URINALYSIS, ROUTINE W REFLEX MICROSCOPIC - Abnormal; Notable for the following components:   APPearance CLOUDY (*)    Hgb urine dipstick TRACE (*)    Ketones, ur 15 (*)    Nitrite POSITIVE (*)    Leukocytes,Ua TRACE (*)    All other components within normal limits  URINALYSIS, MICROSCOPIC (REFLEX) - Abnormal; Notable for the following components:   Bacteria, UA MANY (*)    All other components within normal limits  SARS CORONAVIRUS 2 BY RT PCR  LACTIC ACID, PLASMA   HCG, QUANTITATIVE, PREGNANCY    EKG None  Radiology DG Chest 2 View  Result Date: 02/18/2023 CLINICAL DATA:  Chest pain EXAM: CHEST - 2 VIEW COMPARISON:  10/26/2021 FINDINGS: The heart size and mediastinal contours are within normal limits. Both lungs are clear. The visualized skeletal structures are unremarkable. IMPRESSION: No active cardiopulmonary disease. Electronically Signed   By: Alcide Clever M.D.   On: 02/18/2023 01:44    Procedures Procedures    Medications Ordered in ED Medications  acetaminophen (TYLENOL) tablet 650 mg (650 mg Oral Given 02/18/23 0236)  cephALEXin (KEFLEX) capsule 500 mg (has no administration in time range)    ED Course/ Medical Decision Making/ A&P                             Medical Decision Making Amount and/or Complexity of Data Reviewed Labs: ordered. Radiology: ordered.  Risk OTC drugs.   This patient presents to the ED for concern of chills, body ache, cough, sore throat, dysuria, this involves an extensive number of treatment options, and is a complaint that carries with it a high risk of complications and morbidity.  The differential diagnosis includes viral respiratory infection, strep throat, UTI, others   Co morbidities that complicate the patient evaluation  History of frequent UTIs, strep throat   Lab Tests:  I Ordered, and personally interpreted labs.  The pertinent results include: UA with positive nitrate, trace leukocytes, many bacteria; positive group A strep test result   Imaging Studies ordered:  I ordered imaging studies including chest x-ray  I independently visualized and interpreted imaging which showed no acute disease I agree with the radiologist interpretation   Cardiac Monitoring: / EKG:  The patient was maintained on a cardiac monitor.  I personally viewed and interpreted the cardiac monitored which showed an underlying rhythm of: Sinus tachycardia   Problem List / ED Course / Critical interventions  / Medication management   I ordered medication including Tylenol for fever/pain, Keflex for antibiotic coverage Reevaluation of the patient after these medicines showed that the patient improved I have reviewed the patients home medicines and have made adjustments as needed    Test / Admission - Considered:  Patient with positive group A strep test result, UA consistent with UTI.  Plan to discharge home on Keflex for antibiotic coverage.  Patient instructed on supportive care at home including acetaminophen and ibuprofen for fever and pain control.  Patient voices understanding with plan.  Return  precautions provided.  Discharged home.         Final Clinical Impression(s) / ED Diagnoses Final diagnoses:  Acute cystitis with hematuria  Strep pharyngitis    Rx / DC Orders ED Discharge Orders          Ordered    cephALEXin (KEFLEX) 500 MG capsule  3 times daily        02/18/23 0330              Pamala Duffel 02/18/23 0331    Sabas Sous, MD 02/18/23 [phone]

## 2024-07-22 ENCOUNTER — Ambulatory Visit (HOSPITAL_COMMUNITY)

## 2024-07-22 ENCOUNTER — Telehealth (HOSPITAL_COMMUNITY): Payer: Self-pay | Admitting: *Deleted

## 2024-07-22 ENCOUNTER — Ambulatory Visit (HOSPITAL_COMMUNITY)
Admission: EM | Admit: 2024-07-22 | Discharge: 2024-07-22 | Disposition: A | Discharge status: Home / Self Care | Attending: Physician Assistant | Admitting: Physician Assistant

## 2024-07-22 ENCOUNTER — Encounter (HOSPITAL_COMMUNITY): Payer: Self-pay | Admitting: Emergency Medicine

## 2024-07-22 ENCOUNTER — Other Ambulatory Visit: Payer: Self-pay

## 2024-07-22 ENCOUNTER — Emergency Department (HOSPITAL_COMMUNITY)
Admission: EM | Admit: 2024-07-22 | Discharge: 2024-07-22 | Disposition: A | Discharge status: Home / Self Care | Attending: Emergency Medicine | Admitting: Emergency Medicine

## 2024-07-22 DIAGNOSIS — R0789 Other chest pain: Secondary | ICD-10-CM | POA: Diagnosis not present

## 2024-07-22 DIAGNOSIS — R590 Localized enlarged lymph nodes: Secondary | ICD-10-CM | POA: Diagnosis not present

## 2024-07-22 DIAGNOSIS — R509 Fever, unspecified: Secondary | ICD-10-CM

## 2024-07-22 DIAGNOSIS — J069 Acute upper respiratory infection, unspecified: Secondary | ICD-10-CM | POA: Diagnosis not present

## 2024-07-22 LAB — URINALYSIS, W/ REFLEX TO CULTURE (INFECTION SUSPECTED)
Bacteria, UA: NONE SEEN
Bilirubin Urine: NEGATIVE
Glucose, UA: NEGATIVE mg/dL
Hgb urine dipstick: NEGATIVE
Ketones, ur: 20 mg/dL — AB
Nitrite: NEGATIVE
Protein, ur: NEGATIVE mg/dL
Specific Gravity, Urine: 1.017 (ref 1.005–1.030)
pH: 8 (ref 5.0–8.0)

## 2024-07-22 LAB — POC COVID19/FLU A&B COMBO
Covid Antigen, POC: NEGATIVE
Influenza A Antigen, POC: NEGATIVE
Influenza B Antigen, POC: NEGATIVE

## 2024-07-22 LAB — POCT MONO SCREEN (KUC): Mono, POC: NEGATIVE

## 2024-07-22 LAB — HCG, SERUM, QUALITATIVE: Preg, Serum: NEGATIVE

## 2024-07-22 LAB — CBC WITH DIFFERENTIAL/PLATELET
Abs Immature Granulocytes: 0.03 K/uL (ref 0.00–0.07)
Basophils Absolute: 0 K/uL (ref 0.0–0.1)
Basophils Relative: 0 %
Eosinophils Absolute: 0 K/uL (ref 0.0–0.5)
Eosinophils Relative: 0 %
HCT: 39.5 % (ref 36.0–46.0)
Hemoglobin: 12.6 g/dL (ref 12.0–15.0)
Immature Granulocytes: 0 %
Lymphocytes Relative: 17 %
Lymphs Abs: 1.6 K/uL (ref 0.7–4.0)
MCH: 27.1 pg (ref 26.0–34.0)
MCHC: 31.9 g/dL (ref 30.0–36.0)
MCV: 84.9 fL (ref 80.0–100.0)
Monocytes Absolute: 0.9 K/uL (ref 0.1–1.0)
Monocytes Relative: 10 %
Neutro Abs: 6.7 K/uL (ref 1.7–7.7)
Neutrophils Relative %: 73 %
Platelets: 263 K/uL (ref 150–400)
RBC: 4.65 MIL/uL (ref 3.87–5.11)
RDW: 14.1 % (ref 11.5–15.5)
WBC: 9.3 K/uL (ref 4.0–10.5)
nRBC: 0 % (ref 0.0–0.2)

## 2024-07-22 LAB — COMPREHENSIVE METABOLIC PANEL WITH GFR
ALT: 63 U/L — ABNORMAL HIGH (ref 0–44)
AST: 58 U/L — ABNORMAL HIGH (ref 15–41)
Albumin: 3.7 g/dL (ref 3.5–5.0)
Alkaline Phosphatase: 108 U/L (ref 38–126)
Anion gap: 11 (ref 5–15)
BUN: 9 mg/dL (ref 6–20)
CO2: 21 mmol/L — ABNORMAL LOW (ref 22–32)
Calcium: 9.1 mg/dL (ref 8.9–10.3)
Chloride: 101 mmol/L (ref 98–111)
Creatinine, Ser: 0.84 mg/dL (ref 0.44–1.00)
GFR, Estimated: >60 mL/min (ref >60)
Glucose, Bld: 95 mg/dL (ref 70–99)
Potassium: 3.9 mmol/L (ref 3.5–5.1)
Sodium: 133 mmol/L — ABNORMAL LOW (ref 135–145)
Total Bilirubin: 1.3 mg/dL — ABNORMAL HIGH (ref 0.0–1.2)
Total Protein: 8.1 g/dL (ref 6.5–8.1)

## 2024-07-22 LAB — I-STAT CG4 LACTIC ACID, ED: Lactic Acid, Venous: 0.7 mmol/L (ref 0.5–1.9)

## 2024-07-22 LAB — POCT RAPID STREP A (OFFICE): Rapid Strep A Screen: NEGATIVE

## 2024-07-22 MED ORDER — ACETAMINOPHEN 325 MG PO TABS
650.0000 mg | ORAL_TABLET | Freq: Once | ORAL | Status: AC | PRN
  Administered 2024-07-22: 650 mg via ORAL
  Filled 2024-07-22: qty 2

## 2024-07-22 MED ORDER — AMOXICILLIN-POT CLAVULANATE 875-125 MG PO TABS: 1.0000 | ORAL_TABLET | Freq: Two times a day (BID) | ORAL | 0 refills | Status: AC

## 2024-07-22 NOTE — Discharge Instructions (Signed)
 As we discussed, I am not sure what is causing you to have swollen lymph nodes.  Start Augmentin  twice daily for 10 days as we discussed.  I will contact you if any of your blood work is abnormal.  If you are not feeling better with this medication within a few days you need to be seen again.  If anything worsens and you have weight loss, night sweats, high fever, nausea/vomiting, weakness you need to go to the emergency room immediately.  Follow-up with PCP as scheduled.  I also recommend calling and schedule an appoint with ENT.

## 2024-07-22 NOTE — ED Notes (Signed)
 Mask given to pt and instructed to wear

## 2024-07-22 NOTE — ED Notes (Signed)
 Pt states she did a chest x ray and swabs at [facility] and does not want to repeat those tests here as they were all negative

## 2024-07-22 NOTE — ED Triage Notes (Signed)
 Pt reports behind left ear had bump for almost 2 months. Believes might be a spider bite. Pt has pain in neck area.   Pt reports had fevers of 102-104 for past couple days, body aches, and generalized chest pains. Took Dayquil.

## 2024-07-22 NOTE — ED Notes (Signed)
 Provider informed Pt left and will return for blood work

## 2024-07-22 NOTE — ED Triage Notes (Signed)
 Pt reports fever, lumps behind my neck, and chills. Pt reports the pain radiates to her chest and back. Pt reports being seen at [facility] for the same this morning.

## 2024-07-22 NOTE — ED Provider Notes (Signed)
 [place] EMERGENCY DEPARTMENT AT [facility] Provider Note   CSN: [id number] Arrival date & time: 07/22/24  1711     Patient presents with: Fever   Deanna Sullivan is a 28 y.o. female.   Fever Associated symptoms: chest pain   Patient is a 28 year old female presenting ED today for concerns for cervical lymphadenopathy as well as some chest pain, generalized bodyaches.  With chest pain notably starting after she had taken her first Augmentin  dose earlier today.     Prior to Admission medications   Medication Sig Start Date End Date Taking? Authorizing Provider  amoxicillin -clavulanate (AUGMENTIN ) 875-125 MG tablet Take 1 tablet by mouth every 12 (twelve) hours. 07/22/24   Raspet, Erin K, PA-C    Allergies: Patient has no known allergies.    Review of Systems  Constitutional:  Positive for fever.  Cardiovascular:  Positive for chest pain.  All other systems reviewed and are negative.   Updated Vital Signs BP 110/76 (BP Location: Right Arm)   Pulse 76   Temp 98.8 F (37.1 C) (Oral)   Resp 15   LMP 06/29/2024 (Exact Date)   SpO2 100%   Physical Exam Vitals and nursing note reviewed.  Constitutional:      General: She is not in acute distress.    Appearance: Normal appearance. She is not ill-appearing or diaphoretic.  HENT:     Head: Normocephalic and atraumatic.     Right Ear: Tympanic membrane, ear canal and external ear normal.     Left Ear: Tympanic membrane and external ear normal.     Nose: Nose normal.  Eyes:     General: No scleral icterus.       Right eye: No discharge.        Left eye: No discharge.     Extraocular Movements: Extraocular movements intact.     Conjunctiva/sclera: Conjunctivae normal.  Cardiovascular:     Rate and Rhythm: Normal rate and regular rhythm.     Pulses: Normal pulses.     Heart sounds: Normal heart sounds. No murmur heard.    No friction rub. No gallop.  Pulmonary:     Effort: Pulmonary effort is normal. No  respiratory distress.     Breath sounds: No stridor. No wheezing, rhonchi or rales.  Chest:     Chest wall: No tenderness.  Abdominal:     General: Abdomen is flat. There is no distension.     Palpations: Abdomen is soft.     Tenderness: There is no abdominal tenderness. There is no right CVA tenderness, left CVA tenderness, guarding or rebound.  Musculoskeletal:        General: No swelling, deformity or signs of injury.     Cervical back: Normal range of motion. No rigidity or tenderness.     Right lower leg: No edema.     Left lower leg: No edema.  Lymphadenopathy:     Cervical: Cervical adenopathy (Notably has postauricular and anterior and posterior cervical adenopathy.) present.  Skin:    General: Skin is warm and dry.     Findings: No bruising, erythema or lesion.  Neurological:     General: No focal deficit present.     Mental Status: She is alert and oriented to person, place, and time. Mental status is at baseline.     Sensory: No sensory deficit.     Motor: No weakness.  Psychiatric:        Mood and Affect: Mood normal.     (  all labs ordered are listed, but only abnormal results are displayed) Labs Reviewed  COMPREHENSIVE METABOLIC PANEL WITH GFR - Abnormal; Notable for the following components:      Result Value   Sodium 133 (*)    CO2 21 (*)    AST 58 (*)    ALT 63 (*)    Total Bilirubin 1.3 (*)    All other components within normal limits  URINALYSIS, W/ REFLEX TO CULTURE (INFECTION SUSPECTED) - Abnormal; Notable for the following components:   APPearance HAZY (*)    Ketones, ur 20 (*)    Leukocytes,Ua SMALL (*)    All other components within normal limits  CBC WITH DIFFERENTIAL/PLATELET  HCG, SERUM, QUALITATIVE  I-STAT CG4 LACTIC ACID, ED  I-STAT CG4 LACTIC ACID, ED    EKG: None  Radiology: DG Chest 2 View Result Date: 07/22/2024 EXAM: 2 VIEW(S) XRAY OF THE CHEST 07/22/2024 10:45:43 AM COMPARISON: 02/18/2023 CLINICAL HISTORY: fever FINDINGS: LUNGS  AND PLEURA: No focal pulmonary opacity. No pleural effusion. No pneumothorax. HEART AND MEDIASTINUM: No acute abnormality of the cardiac and mediastinal silhouettes. BONES AND SOFT TISSUES: No acute osseous abnormality. IMPRESSION: 1. No acute cardiopulmonary process. Electronically signed by: Lynwood Seip MD 07/22/2024 11:02 AM EST RP Workstation: [id number]    Procedures   Medications Ordered in the ED  acetaminophen  (TYLENOL ) tablet 650 mg (650 mg Oral Given 07/22/24 1732)     Medical Decision Making Amount and/or Complexity of Data Reviewed Labs: ordered.  Risk OTC drugs.  This patient is a 28 year old female who presents to the ED for concern of URI symptoms, cough, congestion, fever, chest wall tenderness, cervical lymphadenopathy some ongoing x 1 week..  Reportedly saw urgent care today but came in today because after she took her first Augmentin  pill she began to develop some chest pain immediately after.  Chest pain has since improved significantly.  Only having the chest wall tenderness that she noted previously.  On physical exam, patient is in no acute distress, afebrile, alert and orient x 4, speaking in full sentences, nontachypneic, nontachycardic.  LCTAB, RRR, no murmur, no abdominal tenderness palpation, notably does have some chest wall tenderness to left side.  Symptoms are nonexertional, no shortness of breath, patient is well-appearing at this time after having taken Tylenol .  Suspecting likely URI versus ED versus sinusitis.  Patient reporting URI symptoms earlier this week.  Low suspicion for any emergent process present this time.  Lab work does note some mildly elevated LFTs, will have her follow-up with PCP for further monitoring.  Low suspicion for PE, ACS, AAS, considered further CT imaging as well as D-dimer and troponin.  But do not believe is warranted at this time with low suspicion for all.  Will have her continue to take Augmentin .  Establish care with  PCP.  Patient vital signs have remained stable throughout the course of patient's time in the ED. Low suspicion for any other emergent pathology at this time. I believe this patient is safe to be discharged. Provided strict return to ER precautions. Patient expressed agreement and understanding of plan. All questions were answered.  Differential diagnoses prior to evaluation: The emergent differential diagnosis includes, but is not limited to, sinusitis, URI, pneumonia, esophageal foreign body, ACS, PE, AAS, AOM. This is not an exhaustive differential.   Past Medical History / Co-morbidities / Social History: Anemia, pyelonephritis,  Additional history: Chart reviewed. Pertinent results include: Last seen by urgent care this morning for lumps behind left ear and going  down cervical region accompanied with URI symptoms.  Prescribed Augmentin .  Lab Tests/Imaging studies: I personally interpreted labs/imaging and the pertinent results include:    CBC unremarkable CMP shows a mild hyponatremia as well as some mildly elevated AST and ALT and bilirubin.  Otherwise unremarkable.  Lactic unremarkable hCG qualitative unremarkable UA shows small leukocytes and ketones but otherwise unremarkable.  Medications:  I have reviewed the patients home medicines and have made adjustments as needed.  Critical Interventions: None  Social Determinants of Health: None  Disposition: After consideration of the diagnostic results and the patients response to treatment, I feel that the patient would benefit from discharge and shortness of breath.   emergency department workup does not suggest an emergent condition requiring admission or immediate intervention beyond what has been performed at this time. The plan is: Follow-up with PCP, send any management home, continue with antibiotics prescribed by urgent care. The patient is safe for discharge and has been instructed to return immediately for worsening  symptoms, change in symptoms or any other concerns.   Final diagnoses:  Cervical lymphadenopathy  Chest wall pain  Viral upper respiratory tract infection    ED Discharge Orders     None          Deanna Sullivan 07/22/24 2129    Tegeler, Lonni PARAS, MD 07/23/24 [phone]

## 2024-07-22 NOTE — Telephone Encounter (Signed)
 Attempted to call Pt to see if she was coming back for Blood draw. No answer when calling PT . Call went to voice mail.

## 2024-07-22 NOTE — ED Notes (Signed)
 Pt left before blood work was done. Pt called to return for blood work. Pt reported she would come back

## 2024-07-22 NOTE — ED Provider Notes (Signed)
 [facility]    CSN: [id number] Arrival date & time: 07/22/24  0827      History   Chief Complaint Chief Complaint  Patient presents with   Insect Bite    HPI Deanna Sullivan is a 28 y.o. female.   Patient presents today with several concerns.  Her primary concern today is a 1.24-month history of several painful lumps behind her left ear and going down her neck.  She is unsure if something bit her but does not remember any associated wound.  She denies any sore throat, unintentional weight loss, night sweats.  She has been having fevers for the past 2 days but this is the first time she has felt fever since the other symptoms (see below).  Denies any recent medication changes.  She denies any recent travel.  She has not been taking any over-the-counter medication for symptom management.  Denies any recent antibiotics.  For the past several days she has had URI symptoms including fever with Tmax of 104 F, body aches, congestion, chest tightness.  She denies any significant cough but has had an episode of vomiting.  She has been taking DayQuil with temporary improvement of symptoms.  She denies any known sick contacts.  She has not had COVID in the past denies any history of allergies, asthma, COPD, smoking.  Denies any recent antibiotics or steroids.  She has not had COVID-19 vaccinations.  She did have an episode of emesis but does not have any ongoing GI symptoms.  She is comfort that she is not pregnant.    Past Medical History:  Diagnosis Date   [redacted] weeks gestation of pregnancy    Acute pyelonephritis    Acute renal failure syndrome    Acute respiratory failure with hypoxia (HCC)    Anemia    Aortic dissection (HCC)    Back pain    Chlamydia    Depression    current-pt reported   E coli bacteremia    Encounter for fetal anatomic survey    Fetal size inconsistent with dates    Fever and chills    Hx of postpartum hemorrhage, currently pregnant    Hypokalemia     Hypomagnesemia    IUGR, antenatal    Leucocytosis    Metabolic acidosis    Normocytic anemia    NSVD (normal spontaneous vaginal delivery)    Peripartum cardiomyopathy    Poor fetal growth, affecting management of mother, third trimester, not applicable or unspecified fetus    Postpartum endometritis    Pyelonephritis    Retained placenta with hemorrhage, postpartum condition    Shock, septic (HCC)    Tachycardia, unspecified     Patient Active Problem List   Diagnosis Date Noted   Preterm labor in third trimester 06/05/2020   Left against medical advice on 06/05/20 06/05/2020   Premature cervical dilation in third trimester 06/04/2020   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 03/23/2019   Anemia 01/18/2019   Supervision of other normal pregnancy, antepartum 01/02/2019   History of postpartum hemorrhage, currently pregnant 08/02/2017    Past Surgical History:  Procedure Laterality Date   DILATION AND CURETTAGE OF UTERUS     2016   DILATION AND EVACUATION N/A 04/22/2015   Procedure: DILATATION AND EVACUATION;  Surgeon: Lynwood KANDICE Solomons, MD;  Location: [facility];  Service: Gynecology;  Laterality: N/A;    OB History     Gravida  5   Para  3  Term  3   Preterm  0   AB  0   Living  4      SAB  0   IAB  0   Ectopic  0   Multiple  0   Live Births  4            Home Medications    Prior to Admission medications   Medication Sig Start Date End Date Taking? Authorizing Provider  amoxicillin -clavulanate (AUGMENTIN ) 875-125 MG tablet Take 1 tablet by mouth every 12 (twelve) hours. 07/22/24  Yes Shanina Kepple, Rocky POUR, PA-C    Family History Family History  Problem Relation Age of Onset   Cancer Mother    Cancer Maternal Aunt     Social History Social History   Tobacco Use   Smoking status: Former    Current packs/day: 0.00    Types: Cigarettes    Quit date: 04/18/2017    Years since quitting: 7.2   Smokeless tobacco:  Never  Vaping Use   Vaping status: Never Used  Substance Use Topics   Alcohol use: No    Comment: occassionally   Drug use: No     Allergies   Patient has no known allergies.   Review of Systems Review of Systems  Constitutional:  Positive for activity change, fatigue and fever. Negative for appetite change and unexpected weight change.  HENT:  Positive for congestion. Negative for sinus pressure, sneezing and sore throat.   Respiratory:  Positive for chest tightness. Negative for cough, shortness of breath and wheezing.   Cardiovascular:  Negative for chest pain.  Gastrointestinal:  Positive for vomiting. Negative for abdominal pain, diarrhea and nausea.  Musculoskeletal:  Positive for arthralgias and myalgias.  Neurological:  Positive for headaches. Negative for dizziness and light-headedness.  Hematological:  Positive for adenopathy.     Physical Exam Triage Vital Signs ED Triage Vitals  Encounter Vitals Group     BP 07/22/24 0913 103/68     Girls Systolic BP Percentile --      Girls Diastolic BP Percentile --      Boys Systolic BP Percentile --      Boys Diastolic BP Percentile --      Pulse Rate 07/22/24 0913 80     Resp 07/22/24 0913 16     Temp 07/22/24 0913 98.2 F (36.8 C)     Temp Source 07/22/24 0913 Oral     SpO2 07/22/24 0913 98 %     Weight --      Height --      Head Circumference --      Peak Flow --      Pain Score 07/22/24 0912 7     Pain Loc --      Pain Education --      Exclude from Growth Chart --    No data found.  Updated Vital Signs BP 103/68 (BP Location: Right Arm)   Pulse 80   Temp 98.2 F (36.8 C) (Oral)   Resp 16   LMP 06/29/2024 (Exact Date)   SpO2 98%   Visual Acuity Right Eye Distance:   Left Eye Distance:   Bilateral Distance:    Right Eye Near:   Left Eye Near:    Bilateral Near:     Physical Exam Vitals reviewed.  Constitutional:      General: She is awake. She is not in acute distress.    Appearance:  Normal appearance. She is well-developed. She is not ill-appearing.  Comments: Very pleasant female appears stated age in no acute distress sitting comfortable in exam room  HENT:     Head: Normocephalic and atraumatic.     Right Ear: Tympanic membrane, ear canal and external ear normal. Tympanic membrane is not erythematous or bulging.     Left Ear: Tympanic membrane, ear canal and external ear normal. Tympanic membrane is not erythematous or bulging.     Nose:     Right Sinus: No maxillary sinus tenderness or frontal sinus tenderness.     Left Sinus: No maxillary sinus tenderness or frontal sinus tenderness.     Mouth/Throat:     Pharynx: Uvula midline. No oropharyngeal exudate or posterior oropharyngeal erythema.  Cardiovascular:     Rate and Rhythm: Normal rate and regular rhythm.     Heart sounds: Normal heart sounds, S1 normal and S2 normal. No murmur heard. Pulmonary:     Effort: Pulmonary effort is normal.     Breath sounds: Normal breath sounds. No wheezing, rhonchi or rales.     Comments: Clear to auscultation bilaterally Lymphadenopathy:     Head:     Right side of head: No submental, submandibular, tonsillar, preauricular, posterior auricular or occipital adenopathy.     Left side of head: Posterior auricular adenopathy present. No submental, submandibular, tonsillar, preauricular or occipital adenopathy.     Cervical: Cervical adenopathy present.     Right cervical: No superficial cervical adenopathy.    Left cervical: Superficial cervical adenopathy present.     Comments: Multiple (3) mobile and well-defined lymph nodes noted superficial cervical chain on the left ranging in size from 0.5 cm to 1 cm.  Psychiatric:        Behavior: Behavior is cooperative.      UC Treatments / Results  Labs (all labs ordered are listed, but only abnormal results are displayed) Labs Reviewed  CBC WITH DIFFERENTIAL/PLATELET  COMPREHENSIVE METABOLIC PANEL WITH GFR  ANTISTREPTOLYSIN  O TITER  EPSTEIN-BARR VIRUS (EBV) ANTIBODY PROFILE  HIV ANTIBODY (ROUTINE TESTING W REFLEX)  POCT RAPID STREP A (OFFICE)  POCT MONO SCREEN ([facility])  POC COVID19/FLU A&B COMBO    EKG   Radiology DG Chest 2 View Result Date: 07/22/2024 EXAM: 2 VIEW(S) XRAY OF THE CHEST 07/22/2024 10:45:43 AM COMPARISON: 02/18/2023 CLINICAL HISTORY: fever FINDINGS: LUNGS AND PLEURA: No focal pulmonary opacity. No pleural effusion. No pneumothorax. HEART AND MEDIASTINUM: No acute abnormality of the cardiac and mediastinal silhouettes. BONES AND SOFT TISSUES: No acute osseous abnormality. IMPRESSION: 1. No acute cardiopulmonary process. Electronically signed by: Lynwood Seip MD 07/22/2024 11:02 AM EST RP Workstation: [id number]    Procedures Procedures (including critical care time)  Medications Ordered in UC Medications - No data to display  Initial Impression / Assessment and Plan / UC Course  I have reviewed the triage vital signs and the nursing notes.  Pertinent labs & imaging results that were available during my care of the patient were reviewed by me and considered in my medical decision making (see chart for details).     Patient is well-appearing, afebrile, nontoxic, nontachycardic in clinic today.  She does have cervical lymphadenopathy on the left side without identifiable cause.  She reports associated fever but is unsure if this is related to a concurrent viral infection versus underlying issue.  All of her testing in clinic today including influenza/COVID screen, monoscreen, strep testing was negative.  Chest x-ray was obtained that showed no acute cardiopulmonary disease.  We will cover with Augmentin  in case there is  an infectious etiology of lymphadenopathy and she was started on this twice daily for 10 days.  Basic blood work to investigate potential causes was also obtained including CBC, CMP, HIV, ASO titer, EBV titers.  We will contact her if this is abnormal and changes our treatment plan.   She does not currently have a primary care so we will have nursing staff establish her with a PCP and schedule appointment before discharge as we discussed she will need follow-up if her symptoms not resolved with treatment plan.  We also discussed that if the lymphadenopathy does not resolve she should follow-up with ENT she may need imaging that we do not have access to an urgent care as well as potential biopsy.  She was given the contact information for local provider with instruction to call to schedule an appointment.  If she has any worsening or changing symptoms including additional lesions, fever, rash, or/vomiting, swelling of her throat, shortness of breath she needs to be seen immediately.  Strict return precautions given.  All questions were answered to patient satisfaction she expressed understanding and agreement with treatment plan.  Final Clinical Impressions(s) / UC Diagnoses   Final diagnoses:  Cervical lymphadenopathy  Fever, unspecified     Discharge Instructions      As we discussed, I am not sure what is causing you to have swollen lymph nodes.  Start Augmentin  twice daily for 10 days as we discussed.  I will contact you if any of your blood work is abnormal.  If you are not feeling better with this medication within a few days you need to be seen again.  If anything worsens and you have weight loss, night sweats, high fever, nausea/vomiting, weakness you need to go to the emergency room immediately.  Follow-up with PCP as scheduled.  I also recommend calling and schedule an appoint with ENT.     ED Prescriptions     Medication Sig Dispense Auth. Provider   amoxicillin -clavulanate (AUGMENTIN ) 875-125 MG tablet Take 1 tablet by mouth every 12 (twelve) hours. 20 tablet Allen Egerton K, PA-C      PDMP not reviewed this encounter.   Sherrell Rocky POUR, PA-C 07/22/24 1116

## 2024-07-22 NOTE — Discharge Instructions (Signed)
 You are seen today for cervical lymphadenopathy, chest wall pain after swallowing your Augmentin  pill.  Recommend you continue to take your Augmentin , monitoring for worsening symptoms which include worsening chest pain, worsening shortness of breath, uncontrollable vomiting, blood in urine or stool, leg swelling, and if present, please return to the ED for further evaluation.  Otherwise needed follow-up with PCP for persistent symptoms.

## 2024-10-02 ENCOUNTER — Ambulatory Visit: Admitting: Primary Care
